# Patient Record
Sex: Female | Born: 1955 | Race: White | Hispanic: No | Marital: Married | Smoking: Never smoker
Health system: Southern US, Community
[De-identification: ages and names within clinical notes are randomized; demographics above are authoritative.]

## PROBLEM LIST (undated history)

## (undated) DIAGNOSIS — K589 Irritable bowel syndrome without diarrhea: Secondary | ICD-10-CM

## (undated) DIAGNOSIS — M419 Scoliosis, unspecified: Secondary | ICD-10-CM

## (undated) DIAGNOSIS — K219 Gastro-esophageal reflux disease without esophagitis: Secondary | ICD-10-CM

## (undated) DIAGNOSIS — M545 Low back pain, unspecified: Secondary | ICD-10-CM

## (undated) DIAGNOSIS — M48061 Spinal stenosis, lumbar region without neurogenic claudication: Secondary | ICD-10-CM

## (undated) DIAGNOSIS — H269 Unspecified cataract: Secondary | ICD-10-CM

## (undated) DIAGNOSIS — K5792 Diverticulitis of intestine, part unspecified, without perforation or abscess without bleeding: Secondary | ICD-10-CM

## (undated) DIAGNOSIS — M5416 Radiculopathy, lumbar region: Secondary | ICD-10-CM

## (undated) DIAGNOSIS — T7840XA Allergy, unspecified, initial encounter: Secondary | ICD-10-CM

## (undated) DIAGNOSIS — D6851 Activated protein C resistance: Secondary | ICD-10-CM

## (undated) DIAGNOSIS — D1803 Hemangioma of intra-abdominal structures: Secondary | ICD-10-CM

## (undated) DIAGNOSIS — K635 Polyp of colon: Secondary | ICD-10-CM

## (undated) DIAGNOSIS — N809 Endometriosis, unspecified: Secondary | ICD-10-CM

## (undated) DIAGNOSIS — E7800 Pure hypercholesterolemia, unspecified: Secondary | ICD-10-CM

## (undated) DIAGNOSIS — M1990 Unspecified osteoarthritis, unspecified site: Secondary | ICD-10-CM

## (undated) DIAGNOSIS — K7581 Nonalcoholic steatohepatitis (NASH): Secondary | ICD-10-CM

## (undated) HISTORY — DX: Diverticulitis of intestine, part unspecified, without perforation or abscess without bleeding: K57.92

## (undated) HISTORY — DX: Pure hypercholesterolemia, unspecified: E78.00

## (undated) HISTORY — DX: Low back pain, unspecified: M54.50

## (undated) HISTORY — DX: Endometriosis, unspecified: N80.9

## (undated) HISTORY — PX: LAPAROSCOPIC ENDOMETRIOSIS FULGURATION: SUR769

## (undated) HISTORY — DX: Spinal stenosis, lumbar region without neurogenic claudication: M48.061

## (undated) HISTORY — DX: Nonalcoholic steatohepatitis (NASH): K75.81

## (undated) HISTORY — DX: Unspecified cataract: H26.9

## (undated) HISTORY — PX: UPPER GASTROINTESTINAL ENDOSCOPY: SHX188

## (undated) HISTORY — DX: Polyp of colon: K63.5

## (undated) HISTORY — DX: Irritable bowel syndrome, unspecified: K58.9

## (undated) HISTORY — DX: Allergy, unspecified, initial encounter: T78.40XA

## (undated) HISTORY — DX: Gastro-esophageal reflux disease without esophagitis: K21.9

## (undated) HISTORY — DX: Scoliosis, unspecified: M41.9

## (undated) HISTORY — DX: Radiculopathy, lumbar region: M54.16

## (undated) HISTORY — DX: Unspecified osteoarthritis, unspecified site: M19.90

## (undated) HISTORY — DX: Activated protein C resistance: D68.51

## (undated) HISTORY — PX: EYE SURGERY: SHX253

## (undated) HISTORY — PX: CHOLECYSTECTOMY: SHX55

## (undated) HISTORY — DX: Hemangioma of intra-abdominal structures: D18.03

## (undated) HISTORY — PX: COLONOSCOPY: SHX174

---

## 1994-04-18 HISTORY — PX: HEMORRHOID SURGERY: SHX153

## 1996-04-18 HISTORY — PX: ABDOMINAL HYSTERECTOMY: SHX81

## 1998-04-18 HISTORY — PX: BACK SURGERY: SHX140

## 2012-04-18 HISTORY — PX: ESOPHAGOGASTRODUODENOSCOPY: SHX1529

## 2013-07-03 LAB — HM COLONOSCOPY

## 2016-10-18 LAB — HM MAMMOGRAPHY: HM MAMMO: NORMAL (ref 0–4)

## 2017-02-02 ENCOUNTER — Encounter: Payer: Self-pay | Admitting: Physician Assistant

## 2017-02-02 ENCOUNTER — Ambulatory Visit (INDEPENDENT_AMBULATORY_CARE_PROVIDER_SITE_OTHER): Payer: 59 | Admitting: Physician Assistant

## 2017-02-02 VITALS — BP 116/62 | HR 77 | Temp 97.6°F | Resp 18 | Ht 66.5 in | Wt 165.6 lb

## 2017-02-02 DIAGNOSIS — D6851 Activated protein C resistance: Secondary | ICD-10-CM

## 2017-02-02 DIAGNOSIS — Z79899 Other long term (current) drug therapy: Secondary | ICD-10-CM | POA: Diagnosis not present

## 2017-02-02 DIAGNOSIS — N809 Endometriosis, unspecified: Secondary | ICD-10-CM

## 2017-02-02 DIAGNOSIS — K7581 Nonalcoholic steatohepatitis (NASH): Secondary | ICD-10-CM

## 2017-02-02 DIAGNOSIS — D649 Anemia, unspecified: Secondary | ICD-10-CM

## 2017-02-02 DIAGNOSIS — N189 Chronic kidney disease, unspecified: Secondary | ICD-10-CM | POA: Diagnosis not present

## 2017-02-02 DIAGNOSIS — D1803 Hemangioma of intra-abdominal structures: Secondary | ICD-10-CM

## 2017-02-02 DIAGNOSIS — R6889 Other general symptoms and signs: Secondary | ICD-10-CM

## 2017-02-02 DIAGNOSIS — Z0001 Encounter for general adult medical examination with abnormal findings: Secondary | ICD-10-CM

## 2017-02-02 DIAGNOSIS — N750 Cyst of Bartholin's gland: Secondary | ICD-10-CM

## 2017-02-02 DIAGNOSIS — E7211 Homocystinuria: Secondary | ICD-10-CM | POA: Diagnosis not present

## 2017-02-02 DIAGNOSIS — K449 Diaphragmatic hernia without obstruction or gangrene: Secondary | ICD-10-CM

## 2017-02-02 DIAGNOSIS — K5909 Other constipation: Secondary | ICD-10-CM

## 2017-02-02 DIAGNOSIS — E559 Vitamin D deficiency, unspecified: Secondary | ICD-10-CM

## 2017-02-02 DIAGNOSIS — R911 Solitary pulmonary nodule: Secondary | ICD-10-CM

## 2017-02-02 DIAGNOSIS — E782 Mixed hyperlipidemia: Secondary | ICD-10-CM | POA: Diagnosis not present

## 2017-02-02 DIAGNOSIS — M2550 Pain in unspecified joint: Secondary | ICD-10-CM

## 2017-02-02 DIAGNOSIS — R7989 Other specified abnormal findings of blood chemistry: Secondary | ICD-10-CM

## 2017-02-02 DIAGNOSIS — R932 Abnormal findings on diagnostic imaging of liver and biliary tract: Secondary | ICD-10-CM | POA: Insufficient documentation

## 2017-02-02 HISTORY — DX: Endometriosis, unspecified: N80.9

## 2017-02-02 HISTORY — DX: Nonalcoholic steatohepatitis (NASH): K75.81

## 2017-02-02 HISTORY — DX: Activated protein C resistance: D68.51

## 2017-02-02 HISTORY — DX: Hemangioma of intra-abdominal structures: D18.03

## 2017-02-02 NOTE — Progress Notes (Signed)
Complete Physical  Assessment and Plan:  Polyarthralgia Patient had elevated ANA 3 years ago, but no further labs, will check labs due to edema/multiple joint complaints, if positive will send to rheum.  -     Sedimentation rate -     ANA -     Anti-DNA antibody, double-stranded -     Rheumatoid factor -     Cyclic citrul peptide antibody, IgG -     C3 and C4 -     HLA-B27 antigen -     Vitamin B1  Lung nodule Will continue to monitor/request notes  Factor V Leiden [facility]) Patient did not know this was in her history, will request further notes, likely carrier, mom and brother with DVTs, no personal history of DVT or miscarriages, will get on bASA  Mixed hyperlipidemia -     Lipid panel -continue medications, check lipids, decrease fatty foods, increase activity.    Elevated homocysteine (HCC) -     Homocysteine - decrease carbs, weight loss, get on vitamin B's  Vitamin D deficiency -     VITAMIN D 25 Hydroxy (Vit-D Deficiency, Fractures)  Endometriosis No symptoms at this time  Liver hemangioma Monitor  Hiatal hernia Get on zantac/pepcid  Chronic kidney disease, unspecified CKD stage -     BASIC METABOLIC PANEL WITH GFR -     Hemoglobin A1c -     Urinalysis, Routine w reflex microscopic -     Microalbumin / creatinine urine ratio -     Uric acid - Increase fluids, avoid NSAIDS, monitor sugars, will monitor  NASH (nonalcoholic steatohepatitis) -     Hepatic function panel - Check labs, avoid tylenol, alcohol, weight loss advised.   Other constipation -     TSH - increase water, fiber  Bartholin cyst -     Ambulatory referral to Gynecology - on CT scan, has discomfort, will refer to GYN or evaluation  Medication management -     CBC with Differential/Platelet -     BASIC METABOLIC PANEL WITH GFR -     Hepatic function panel -     Magnesium  Anemia, unspecified type -     Iron,Total/Total Iron Binding Cap -     Ferritin -     Vitamin  B12   Discussed med's effects and SE's. Screening labs and tests as requested with regular follow-up as recommended. Over 40 minutes of exam, counseling, chart review, and complex, high level critical decision making was performed this visit.   HPI  61 y.o. female  presents for a complete physical and follow up for has Lung nodule; Factor V Leiden ([place]); Mixed hyperlipidemia; Elevated homocysteine ([place]); Vitamin D deficiency; Endometriosis; Liver hemangioma; Hiatal hernia; CKD (chronic kidney disease); and NASH (nonalcoholic steatohepatitis) on her problem list..  Moved here from [place], lived in [place] for a while, she is married, she is an Optometrist.  She has been following with GI, Dr. Odis Hollingshead, [phone] Fax [phone]  She recounts a time in [place] that she had horrible inflammation/edema, had AB edema, pain in her hands, had + ANA. She has had recent LUQ pain, had CT AB that showed diverticuli, hemangioma of the liver, and right sided bartholin cyst. She has right lower AB pain and would like to see GYN.  She has been having swelling hands, legs, feet, having severe pain in these areas, started whole 30 that is helping.   Has had rash on her back x years.   Her blood pressure has  been controlled at home, today their BP is BP: 116/62 She does not workout due to pain. She denies chest pain, shortness of breath, dizziness.   She has history of NASH, CKD3 and elevated ANA and homocysteine.   Current Medications:  No current outpatient prescriptions on file prior to visit.   No current facility-administered medications on file prior to visit.    Allergies:  Allergies  Allergen Reactions  . Morphine And Related Swelling   Medical History:  She has Lung nodule; Factor V Leiden ([place]); Mixed hyperlipidemia; Elevated homocysteine ([place]); Vitamin D deficiency; Endometriosis; Liver hemangioma; Hiatal hernia; CKD (chronic kidney disease); and NASH (nonalcoholic steatohepatitis) on her  problem list.   Health Maintenance:   Tetanus: 12/2015 Pneumovax: Prevnar 13:  Flu vaccine: Zostavax:  LMP: TAH Pap: TAH MGM:10/17/2016 DEXA: N/A Colonoscopy: 2015 per patient every 5 years EGD: N/A  Patient Care Team: Unk Pinto, MD as PCP - General (Internal Medicine)  Surgical History:  She has a past surgical history that includes Cholecystectomy; Abdominal hysterectomy (1998); Laparoscopic endometriosis fulguration; Hemorrhoid surgery (1996); and Back surgery (2000). Family History:  Herfamily history includes Arthritis in her mother; Atrial fibrillation in her brother; Cancer (age of onset: 82) in her maternal grandmother; Cirrhosis in her mother; Depression in her mother and sister; Diabetes in her brother, father, and mother; Gout in her mother; Heart disease in her father; Hyperlipidemia in her son; Hypertension in her brother; Valvular heart disease in her brother and sister. Social History:  She reports that she has never smoked. She has never used smokeless tobacco.  Review of Systems: Review of Systems  Constitutional: Positive for malaise/fatigue. Negative for chills, diaphoresis, fever and weight loss.  HENT: Negative.   Eyes: Negative.   Respiratory: Negative.   Cardiovascular: Positive for leg swelling. Negative for chest pain, palpitations, orthopnea, claudication and PND.  Gastrointestinal: Positive for abdominal pain, constipation and heartburn. Negative for blood in stool, diarrhea, melena, nausea and vomiting.  Genitourinary: Negative.   Musculoskeletal: Positive for myalgias. Negative for back pain, falls, joint pain and neck pain.  Skin: Positive for rash. Negative for itching.  Neurological: Negative.  Negative for weakness.  Endo/Heme/Allergies: Negative.   Psychiatric/Behavioral: Negative for hallucinations, memory loss, substance abuse and suicidal ideas. The patient has insomnia. The patient is not nervous/anxious.     Physical  Exam: Estimated body mass index is 26.33 kg/m as calculated from the following:   Height as of this encounter: 5' 6.5" (1.689 m).   Weight as of this encounter: 165 lb 9.6 oz (75.1 kg). BP 116/62   Pulse 77   Temp 97.6 F (36.4 C)   Resp 18   Ht 5' 6.5" (1.689 m)   Wt 165 lb 9.6 oz (75.1 kg)   SpO2 95%   BMI 26.33 kg/m  General Appearance: Well nourished, in no apparent distress.  Eyes: PERRLA, EOMs, conjunctiva no swelling or erythema, normal fundi and vessels.  Sinuses: No Frontal/maxillary tenderness  ENT/Mouth: Ext aud canals clear, normal light reflex with TMs without erythema, bulging. Good dentition. No erythema, swelling, or exudate on post pharynx. Tonsils not swollen or erythematous. Hearing normal.  Neck: Supple, thyroid normal. No bruits  Respiratory: Respiratory effort normal, BS equal bilaterally without rales, rhonchi, wheezing or stridor.  Cardio: RRR without murmurs, rubs or gallops. Brisk peripheral pulses without edema.  Chest: symmetric, with normal excursions and percussion.  Breasts: defer Abdomen: Soft, left upper quadrant tenderness, no guarding, rebound, hernias, masses, or organomegaly.  Lymphatics: Non tender  without lymphadenopathy.  Genitourinary: defer Musculoskeletal: Full ROM all peripheral extremities,5/5 strength, and normal gait.  Skin: Warm, dry without rashes, lesions, ecchymosis. Neuro: Cranial nerves intact, reflexes equal bilaterally. Normal muscle tone, no cerebellar symptoms. Sensation intact.  Psych: Awake and oriented X 3, normal affect, Insight and Judgment appropriate.   EKG: defer AORTA SCAN: defer  Vicie Mutters 8:01 AM [facility]

## 2017-02-02 NOTE — Patient Instructions (Addendum)
Add ENTERIC COATED low dose 81 mg Aspirin daily OR can do every other day if you have easy bruising to protect your heart and head. As well as to reduce risk of Colon Cancer by 20 %, Skin Cancer by 26 % , Melanoma by 46% and Pancreatic cancer by 60%  Get on zantac or pepcid x 2 weeks 1 pill at night then stop  Add on apple cider vinegar Can do kabucha too Look up yeast overgrowth syndrome  Look up LPR or silent reflux  Check about the sleep apnea  I think it is possible that you have sleep apnea. It can cause interrupted sleep, headaches, frequent awakenings, fatigue, dry mouth, fast/slow heart beats, memory issues, anxiety/depression, swelling, numbness tingling hands/feet, weight gain, shortness of breath, and the list goes on. Sleep apnea needs to be ruled out because if it is left untreated it does eventually lead to abnormal heart beats, lung failure or heart failure as well as increasing the risk of heart attack and stroke. There are masks you can wear OR a mouth piece that I can give you information about. Often times though people feel MUCH better after getting treatment.   Sleep Apnea  Sleep apnea is a sleep disorder characterized by abnormal pauses in breathing while you sleep. When your breathing pauses, the level of oxygen in your blood decreases. This causes you to move out of deep sleep and into light sleep. As a result, your quality of sleep is poor, and the system that carries your blood throughout your body (cardiovascular system) experiences stress. If sleep apnea remains untreated, the following conditions can develop:  High blood pressure (hypertension).  Coronary artery disease.  Inability to achieve or maintain an erection (impotence).  Impairment of your thought process (cognitive dysfunction). There are three types of sleep apnea: 1. Obstructive sleep apnea--Pauses in breathing during sleep because of a blocked airway. 2. Central sleep apnea--Pauses in breathing  during sleep because the area of the brain that controls your breathing does not send the correct signals to the muscles that control breathing. 3. Mixed sleep apnea--A combination of both obstructive and central sleep apnea.  RISK FACTORS The following risk factors can increase your risk of developing sleep apnea:  Being overweight.  Smoking.  Having narrow passages in your nose and throat.  Being of older age.  Being female.  Alcohol use.  Sedative and tranquilizer use.  Ethnicity. Among individuals younger than 35 years, African Americans are at increased risk of sleep apnea. SYMPTOMS   Difficulty staying asleep.  Daytime sleepiness and fatigue.  Loss of energy.  Irritability.  Loud, heavy snoring.  Morning headaches.  Trouble concentrating.  Forgetfulness.  Decreased interest in sex. DIAGNOSIS  In order to diagnose sleep apnea, your caregiver will perform a physical examination. Your caregiver may suggest that you take a home sleep test. Your caregiver may also recommend that you spend the night in a sleep lab. In the sleep lab, several monitors record information about your heart, lungs, and brain while you sleep. Your leg and arm movements and blood oxygen level are also recorded. TREATMENT The following actions may help to resolve mild sleep apnea:  Sleeping on your side.   Using a decongestant if you have nasal congestion.   Avoiding the use of depressants, including alcohol, sedatives, and narcotics.   Losing weight and modifying your diet if you are overweight. There also are devices and treatments to help open your airway:  Oral appliances. These are custom-made  mouthpieces that shift your lower jaw forward and slightly open your bite. This opens your airway.  Devices that create positive airway pressure. This positive pressure "splints" your airway open to help you breathe better during sleep. The following devices create positive airway  pressure:  Continuous positive airway pressure (CPAP) device. The CPAP device creates a continuous level of air pressure with an air pump. The air is delivered to your airway through a mask while you sleep. This continuous pressure keeps your airway open.  Nasal expiratory positive airway pressure (EPAP) device. The EPAP device creates positive air pressure as you exhale. The device consists of single-use valves, which are inserted into each nostril and held in place by adhesive. The valves create very little resistance when you inhale but create much more resistance when you exhale. That increased resistance creates the positive airway pressure. This positive pressure while you exhale keeps your airway open, making it easier to breath when you inhale again.  Bilevel positive airway pressure (BPAP) device. The BPAP device is used mainly in patients with central sleep apnea. This device is similar to the CPAP device because it also uses an air pump to deliver continuous air pressure through a mask. However, with the BPAP machine, the pressure is set at two different levels. The pressure when you exhale is lower than the pressure when you inhale.  Surgery. Typically, surgery is only done if you cannot comply with less invasive treatments or if the less invasive treatments do not improve your condition. Surgery involves removing excess tissue in your airway to create a wider passage way. Document Released: 03/25/2002 Document Revised: 07/30/2012 Document Reviewed: 08/11/2011 [facility] Patient Information 2015 [place], [place]. This information is not intended to replace advice given to you by your health care provider. Make sure you discuss any questions you have with your health care provider.

## 2017-02-05 ENCOUNTER — Encounter: Payer: Self-pay | Admitting: Physician Assistant

## 2017-02-05 DIAGNOSIS — R768 Other specified abnormal immunological findings in serum: Secondary | ICD-10-CM

## 2017-02-08 LAB — CBC WITH DIFFERENTIAL/PLATELET
BASOS ABS: 61 {cells}/uL (ref 0–200)
Basophils Relative: 1 %
EOS ABS: 250 {cells}/uL (ref 15–500)
Eosinophils Relative: 4.1 %
HCT: 38.8 % (ref 35.0–45.0)
HEMOGLOBIN: 13.3 g/dL (ref 11.7–15.5)
Lymphs Abs: 1909 cells/uL (ref 850–3900)
MCH: 30.9 pg (ref 27.0–33.0)
MCHC: 34.3 g/dL (ref 32.0–36.0)
MCV: 90.2 fL (ref 80.0–100.0)
MONOS PCT: 8 %
MPV: 11.1 fL (ref 7.5–12.5)
NEUTROS ABS: 3392 {cells}/uL (ref 1500–7800)
Neutrophils Relative %: 55.6 %
Platelets: 254 10*3/uL (ref 140–400)
RBC: 4.3 10*6/uL (ref 3.80–5.10)
RDW: 11.5 % (ref 11.0–15.0)
Total Lymphocyte: 31.3 %
WBC: 6.1 10*3/uL (ref 3.8–10.8)
WBCMIX: 488 {cells}/uL (ref 200–950)

## 2017-02-08 LAB — VITAMIN D 25 HYDROXY (VIT D DEFICIENCY, FRACTURES): Vit D, 25-Hydroxy: 42 ng/mL (ref 30–100)

## 2017-02-08 LAB — LIPID PANEL
CHOL/HDL RATIO: 3.5 (calc) (ref <5.0)
CHOLESTEROL: 229 mg/dL — AB (ref <200)
HDL: 65 mg/dL (ref >50)
LDL Cholesterol (Calc): 138 mg/dL (calc) — ABNORMAL HIGH
Non-HDL Cholesterol (Calc): 164 mg/dL (calc) — ABNORMAL HIGH (ref <130)
Triglycerides: 132 mg/dL (ref <150)

## 2017-02-08 LAB — URINALYSIS, ROUTINE W REFLEX MICROSCOPIC
Bilirubin Urine: NEGATIVE
GLUCOSE, UA: NEGATIVE
Hgb urine dipstick: NEGATIVE
Ketones, ur: NEGATIVE
LEUKOCYTES UA: NEGATIVE
Nitrite: NEGATIVE
PH: 7.5 (ref 5.0–8.0)
PROTEIN: NEGATIVE
Specific Gravity, Urine: 1.019 (ref 1.001–1.03)

## 2017-02-08 LAB — HEPATIC FUNCTION PANEL
AG RATIO: 2 (calc) (ref 1.0–2.5)
ALKALINE PHOSPHATASE (APISO): 50 U/L (ref 33–130)
ALT: 18 U/L (ref 6–29)
AST: 18 U/L (ref 10–35)
Albumin: 4.5 g/dL (ref 3.6–5.1)
BILIRUBIN INDIRECT: 0.5 mg/dL (ref 0.2–1.2)
Bilirubin, Direct: 0.1 mg/dL (ref 0.0–0.2)
Globulin: 2.3 g/dL (calc) (ref 1.9–3.7)
TOTAL PROTEIN: 6.8 g/dL (ref 6.1–8.1)
Total Bilirubin: 0.6 mg/dL (ref 0.2–1.2)

## 2017-02-08 LAB — BASIC METABOLIC PANEL WITH GFR
BUN / CREAT RATIO: 25 (calc) — AB (ref 6–22)
BUN: 26 mg/dL — ABNORMAL HIGH (ref 7–25)
CO2: 25 mmol/L (ref 20–32)
CREATININE: 1.05 mg/dL (ref 0.50–1.05)
Calcium: 9.4 mg/dL (ref 8.6–10.4)
Chloride: 105 mmol/L (ref 98–110)
GFR, EST AFRICAN AMERICAN: 67 mL/min/{1.73_m2} (ref >=60)
GFR, Est Non African American: 58 mL/min/{1.73_m2} — ABNORMAL LOW (ref >=60)
GLUCOSE: 86 mg/dL (ref 65–99)
Potassium: 4 mmol/L (ref 3.5–5.3)
SODIUM: 140 mmol/L (ref 135–146)

## 2017-02-08 LAB — URIC ACID: Uric Acid, Serum: 5.3 mg/dL (ref 2.5–7.0)

## 2017-02-08 LAB — ANTI-DNA ANTIBODY, DOUBLE-STRANDED: ds DNA Ab: 1 IU/mL

## 2017-02-08 LAB — MICROALBUMIN / CREATININE URINE RATIO
Creatinine, Urine: 109 mg/dL (ref 20–275)
MICROALB/CREAT RATIO: 2 ug/mg{creat} (ref <30)
Microalb, Ur: 0.2 mg/dL

## 2017-02-08 LAB — HEMOGLOBIN A1C
Hgb A1c MFr Bld: 5.5 % of total Hgb (ref <5.7)
Mean Plasma Glucose: 111 (calc)
eAG (mmol/L): 6.2 (calc)

## 2017-02-08 LAB — VITAMIN B1: VITAMIN B1 (THIAMINE): 9 nmol/L (ref 8–30)

## 2017-02-08 LAB — FERRITIN: FERRITIN: 133 ng/mL (ref 10–232)

## 2017-02-08 LAB — CYCLIC CITRUL PEPTIDE ANTIBODY, IGG: Cyclic Citrullin Peptide Ab: <16 UNITS

## 2017-02-08 LAB — IRON, TOTAL/TOTAL IRON BINDING CAP
%SAT: 18 % (calc) (ref 11–50)
IRON: 60 ug/dL (ref 45–160)
TIBC: 334 ug/dL (ref 250–450)

## 2017-02-08 LAB — ANA: ANA: POSITIVE — AB

## 2017-02-08 LAB — RHEUMATOID FACTOR: Rhuematoid fact SerPl-aCnc: <14 IU/mL (ref <14)

## 2017-02-08 LAB — HLA-B27 ANTIGEN: HLA-B27 ANTIGEN: NEGATIVE

## 2017-02-08 LAB — VITAMIN B12: Vitamin B-12: >2000 pg/mL — ABNORMAL HIGH (ref 200–1100)

## 2017-02-08 LAB — C3 AND C4
C3 Complement: 135 mg/dL (ref 83–193)
C4 Complement: 24 mg/dL (ref 15–57)

## 2017-02-08 LAB — ANTI-NUCLEAR AB-TITER (ANA TITER)

## 2017-02-08 LAB — TSH: TSH: 2.27 m[IU]/L (ref 0.40–4.50)

## 2017-02-08 LAB — HOMOCYSTEINE: HOMOCYSTEINE: 11.1 umol/L — AB (ref <10.4)

## 2017-02-08 LAB — SEDIMENTATION RATE: SED RATE: 9 mm/h (ref 0–30)

## 2017-02-08 LAB — MAGNESIUM: Magnesium: 2.2 mg/dL (ref 1.5–2.5)

## 2017-02-16 DIAGNOSIS — M79641 Pain in right hand: Secondary | ICD-10-CM | POA: Diagnosis not present

## 2017-02-16 DIAGNOSIS — M79671 Pain in right foot: Secondary | ICD-10-CM | POA: Diagnosis not present

## 2017-02-16 DIAGNOSIS — M25561 Pain in right knee: Secondary | ICD-10-CM | POA: Diagnosis not present

## 2017-02-16 DIAGNOSIS — M25572 Pain in left ankle and joints of left foot: Secondary | ICD-10-CM | POA: Diagnosis not present

## 2017-02-16 DIAGNOSIS — M2550 Pain in unspecified joint: Secondary | ICD-10-CM | POA: Diagnosis not present

## 2017-02-22 ENCOUNTER — Ambulatory Visit: Payer: Self-pay | Admitting: Gynecology

## 2017-03-16 ENCOUNTER — Encounter: Payer: Self-pay | Admitting: Internal Medicine

## 2017-03-16 ENCOUNTER — Ambulatory Visit: Payer: Self-pay | Admitting: Gynecology

## 2017-03-20 ENCOUNTER — Ambulatory Visit: Payer: 59 | Admitting: Obstetrics & Gynecology

## 2017-03-20 ENCOUNTER — Encounter: Payer: Self-pay | Admitting: Obstetrics & Gynecology

## 2017-03-20 VITALS — BP 136/84 | Ht 66.5 in | Wt 170.0 lb

## 2017-03-20 DIAGNOSIS — Z13820 Encounter for screening for osteoporosis: Secondary | ICD-10-CM

## 2017-03-20 DIAGNOSIS — N750 Cyst of Bartholin's gland: Secondary | ICD-10-CM

## 2017-03-20 DIAGNOSIS — E8940 Asymptomatic postprocedural ovarian failure: Secondary | ICD-10-CM

## 2017-03-20 DIAGNOSIS — Z01411 Encounter for gynecological examination (general) (routine) with abnormal findings: Secondary | ICD-10-CM | POA: Diagnosis not present

## 2017-03-20 DIAGNOSIS — Z1151 Encounter for screening for human papillomavirus (HPV): Secondary | ICD-10-CM

## 2017-03-20 NOTE — Progress Notes (Signed)
Samantha Mckinney [birth year] [id number]   History:    61 y.o. G1P1L1 Married.  Son is 110 yo doing well.  RP:  New patient presenting  for annual gyn exam   HPI:  S/P Total Hysterectomy/BSO at 61 yo.  On Estrogen HRT until 2012.  Investigating Arthritis/R/O Lupus currently.  No pelvic pain.  Breasts normal.  Urine and bowel movements normal.  Health labs with family physician.  Past medical history,surgical history, family history and social history were all reviewed and documented in the EPIC chart.  Gynecologic History No LMP recorded. Patient has had a hysterectomy. Contraception: status post hysterectomy Last Pap: 2017. Results were: normal Last mammogram: 2018. Results were: normal  Obstetric History OB History  Gravida Para Term Preterm AB Living  1 1       1   SAB TAB Ectopic Multiple Live Births               # Outcome Date GA Lbr Len/2nd Weight Sex Delivery Anes PTL Lv  1 Para                ROS: A ROS was performed and pertinent positives and negatives are included in the history.  GENERAL: No fevers or chills. HEENT: No change in vision, no earache, sore throat or sinus congestion. NECK: No pain or stiffness. CARDIOVASCULAR: No chest pain or pressure. No palpitations. PULMONARY: No shortness of breath, cough or wheeze. GASTROINTESTINAL: No abdominal pain, nausea, vomiting or diarrhea, melena or bright red blood per rectum. GENITOURINARY: No urinary frequency, urgency, hesitancy or dysuria. MUSCULOSKELETAL: No joint or muscle pain, no back pain, no recent trauma. DERMATOLOGIC: No rash, no itching, no lesions. ENDOCRINE: No polyuria, polydipsia, no heat or cold intolerance. No recent change in weight. HEMATOLOGICAL: No anemia or easy bruising or bleeding. NEUROLOGIC: No headache, seizures, numbness, tingling or weakness. PSYCHIATRIC: No depression, no loss of interest in normal activity or change in sleep pattern.     Exam:   BP 136/84   Ht 5' 6.5" (1.689 m)   Wt 170 lb  (77.1 kg)   BMI 27.03 kg/m   Body mass index is 27.03 kg/m.  General appearance : Well developed well nourished female. No acute distress HEENT: Eyes: no retinal hemorrhage or exudates,  Neck supple, trachea midline, no carotid bruits, no thyroidmegaly Lungs: Clear to auscultation, no rhonchi or wheezes, or rib retractions  Heart: Regular rate and rhythm, no murmurs or gallops Breast:Examined in sitting and supine position were symmetrical in appearance, no palpable masses or tenderness,  no skin retraction, no nipple inversion, no nipple discharge, no skin discoloration, no axillary or supraclavicular lymphadenopathy Abdomen: no palpable masses or tenderness, no rebound or guarding Extremities: no edema or skin discoloration or tenderness  Pelvic: Vulva Left normal.  Right with small Bartholin gland cyst about 2 cm.  No erythema, nontender.  No sign of infection.  Urethra, Skene Glands: Within normal limits             Vagina: No gross lesions or discharge  Cervix/Uterus absent  Adnexa  Without masses or tenderness  Anus and perineum  normal    Assessment/Plan:  61 y.o. female for annual exam   1. Encounter for gynecological examination with abnormal finding Gynecologic exam with small right Bartholin gland cyst.  Pap normal in 2017.  Will repeat Pap next year.  Breast exam normal.  Mammogram in 2018 normal.  Colonoscopy 5 years ago.  Will do health labs with family physician.  2. Cyst of right Bartholin's gland Small, asymptomatic, no sign of infection.  Recommend observation.  Reassured.  Counseling done in case it becomes infected.  3. 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    4. Screening for osteoporosis Vitamin D supplements, calcium in nutrition and weight bearing physical activity recommended.  Will follow up here for bone density.  Counseling on above issues more than 15% for - DG Bone Density; Future  Counseling on above issues more  than 50% for 10 minutes.  Princess Bruins MD, 11:54 AM 03/20/2017

## 2017-03-22 LAB — PAP, TP IMAGING W/ HPV RNA, RFLX HPV TYPE 16,18/45: HPV DNA HIGH RISK: NOT DETECTED

## 2017-03-26 ENCOUNTER — Encounter: Payer: Self-pay | Admitting: Obstetrics & Gynecology

## 2017-03-26 NOTE — Patient Instructions (Signed)
1. Encounter for gynecological examination with abnormal finding Gynecologic exam with small right Bartholin gland cyst.  Pap normal in 2017.  Will repeat Pap next year.  Breast exam normal.  Mammogram in 2018 normal.  Colonoscopy 5 years ago.  Will do health labs with family physician.  2. Cyst of right Bartholin's gland Small, asymptomatic, no sign of infection.  Recommend observation.  Reassured.  Counseling done in case it becomes infected.  3. 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    4. Screening for osteoporosis Vitamin D supplements, calcium in nutrition and weight bearing physical activity recommended.  Will follow up here for bone density.  Counseling on above issues more than 15% for - DG Bone Density; Future  Samantha Mckinney, it was a pleasure meeting you today!  I will inform you of your bone density results when available.   Health Maintenance for Postmenopausal Women Menopause is a normal process in which your reproductive ability comes to an end. This process happens gradually over a span of months to years, usually between the ages of 9 and 76. Menopause is complete when you have missed 12 consecutive menstrual periods. It is important to talk with your health care provider about some of the most common conditions that affect postmenopausal women, such as heart disease, cancer, and bone loss (osteoporosis). Adopting a healthy lifestyle and getting preventive care can help to promote your health and wellness. Those actions can also lower your chances of developing some of these common conditions. What should I know about menopause? During menopause, you may experience a number of symptoms, such as:  Moderate-to-severe hot flashes.  Night sweats.  Decrease in sex drive.  Mood swings.  Headaches.  Tiredness.  Irritability.  Memory problems.  Insomnia.  Choosing to treat or not to treat menopausal changes is an individual decision that you  make with your health care provider. What should I know about hormone replacement therapy and supplements? Hormone therapy products are effective for treating symptoms that are associated with menopause, such as hot flashes and night sweats. Hormone replacement carries certain risks, especially as you become older. If you are thinking about using estrogen or estrogen with progestin treatments, discuss the benefits and risks with your health care provider. What should I know about heart disease and stroke? Heart disease, heart attack, and stroke become more likely as you age. This may be due, in part, to the hormonal changes that your body experiences during menopause. These can affect how your body processes dietary fats, triglycerides, and cholesterol. Heart attack and stroke are both medical emergencies. There are many things that you can do to help prevent heart disease and stroke:  Have your blood pressure checked at least every 1-2 years. High blood pressure causes heart disease and increases the risk of stroke.  If you are 67-17 years old, ask your health care provider if you should take aspirin to prevent a heart attack or a stroke.  Do not use any tobacco products, including cigarettes, chewing tobacco, or electronic cigarettes. If you need help quitting, ask your health care provider.  It is important to eat a healthy diet and maintain a healthy weight. ? Be sure to include plenty of vegetables, fruits, low-fat dairy products, and lean protein. ? Avoid eating foods that are high in solid fats, added sugars, or salt (sodium).  Get regular exercise. This is one of the most important things that you can do for your health. ? Try to exercise for at  least 150 minutes each week. The type of exercise that you do should increase your heart rate and make you sweat. This is known as moderate-intensity exercise. ? Try to do strengthening exercises at least twice each week. Do these in addition to  the moderate-intensity exercise.  Know your numbers.Ask your health care provider to check your cholesterol and your blood glucose. Continue to have your blood tested as directed by your health care provider.  What should I know about cancer screening? There are several types of cancer. Take the following steps to reduce your risk and to catch any cancer development as early as possible. Breast Cancer  Practice breast self-awareness. ? This means understanding how your breasts normally appear and feel. ? It also means doing regular breast self-exams. Let your health care provider know about any changes, no matter how small.  If you are 14 or older, have a clinician do a breast exam (clinical breast exam or CBE) every year. Depending on your age, family history, and medical history, it may be recommended that you also have a yearly breast X-ray (mammogram).  If you have a family history of breast cancer, talk with your health care provider about genetic screening.  If you are at high risk for breast cancer, talk with your health care provider about having an MRI and a mammogram every year.  Breast cancer (BRCA) gene test is recommended for women who have family members with BRCA-related cancers. Results of the assessment will determine the need for genetic counseling and BRCA1 and for BRCA2 testing. BRCA-related cancers include these types: ? Breast. This occurs in males or females. ? Ovarian. ? Tubal. This may also be called fallopian tube cancer. ? Cancer of the abdominal or pelvic lining (peritoneal cancer). ? Prostate. ? Pancreatic.  Cervical, Uterine, and Ovarian Cancer Your health care provider may recommend that you be screened regularly for cancer of the pelvic organs. These include your ovaries, uterus, and vagina. This screening involves a pelvic exam, which includes checking for microscopic changes to the surface of your cervix (Pap test).  For women ages 21-65, health care  providers may recommend a pelvic exam and a Pap test every three years. For women ages 72-65, they may recommend the Pap test and pelvic exam, combined with testing for human papilloma virus (HPV), every five years. Some types of HPV increase your risk of cervical cancer. Testing for HPV may also be done on women of any age who have unclear Pap test results.  Other health care providers may not recommend any screening for nonpregnant women who are considered low risk for pelvic cancer and have no symptoms. Ask your health care provider if a screening pelvic exam is right for you.  If you have had past treatment for cervical cancer or a condition that could lead to cancer, you need Pap tests and screening for cancer for at least 20 years after your treatment. If Pap tests have been discontinued for you, your risk factors (such as having a new sexual partner) need to be reassessed to determine if you should start having screenings again. Some women have medical problems that increase the chance of getting cervical cancer. In these cases, your health care provider may recommend that you have screening and Pap tests more often.  If you have a family history of uterine cancer or ovarian cancer, talk with your health care provider about genetic screening.  If you have vaginal bleeding after reaching menopause, tell your health care provider.  There are currently no reliable tests available to screen for ovarian cancer.  Lung Cancer Lung cancer screening is recommended for adults 30-76 years old who are at high risk for lung cancer because of a history of smoking. A yearly low-dose CT scan of the lungs is recommended if you:  Currently smoke.  Have a history of at least 30 pack-years of smoking and you currently smoke or have quit within the past 15 years. A pack-year is smoking an average of one pack of cigarettes per day for one year.  Yearly screening should:  Continue until it has been 15 years  since you quit.  Stop if you develop a health problem that would prevent you from having lung cancer treatment.  Colorectal Cancer  This type of cancer can be detected and can often be prevented.  Routine colorectal cancer screening usually begins at age 45 and continues through age 36.  If you have risk factors for colon cancer, your health care provider may recommend that you be screened at an earlier age.  If you have a family history of colorectal cancer, talk with your health care provider about genetic screening.  Your health care provider may also recommend using home test kits to check for hidden blood in your stool.  A small camera at the end of a tube can be used to examine your colon directly (sigmoidoscopy or colonoscopy). This is done to check for the earliest forms of colorectal cancer.  Direct examination of the colon should be repeated every 5-10 years until age 26. However, if early forms of precancerous polyps or small growths are found or if you have a family history or genetic risk for colorectal cancer, you may need to be screened more often.  Skin Cancer  Check your skin from head to toe regularly.  Monitor any moles. Be sure to tell your health care provider: ? About any new moles or changes in moles, especially if there is a change in a mole's shape or color. ? If you have a mole that is larger than the size of a pencil eraser.  If any of your family members has a history of skin cancer, especially at a young age, talk with your health care provider about genetic screening.  Always use sunscreen. Apply sunscreen liberally and repeatedly throughout the day.  Whenever you are outside, protect yourself by wearing long sleeves, pants, a wide-brimmed hat, and sunglasses.  What should I know about osteoporosis? Osteoporosis is a condition in which bone destruction happens more quickly than new bone creation. After menopause, you may be at an increased risk for  osteoporosis. To help prevent osteoporosis or the bone fractures that can happen because of osteoporosis, the following is recommended:  If you are 4-15 years old, get at least 1,000 mg of calcium and at least 600 mg of vitamin D per day.  If you are older than age 48 but younger than age 64, get at least 1,200 mg of calcium and at least 600 mg of vitamin D per day.  If you are older than age 39, get at least 1,200 mg of calcium and at least 800 mg of vitamin D per day.  Smoking and excessive alcohol intake increase the risk of osteoporosis. Eat foods that are rich in calcium and vitamin D, and do weight-bearing exercises several times each week as directed by your health care provider. What should I know about how menopause affects my mental health? Depression may occur at any  age, but it is more common as you become older. Common symptoms of depression include:  Low or sad mood.  Changes in sleep patterns.  Changes in appetite or eating patterns.  Feeling an overall lack of motivation or enjoyment of activities that you previously enjoyed.  Frequent crying spells.  Talk with your health care provider if you think that you are experiencing depression. What should I know about immunizations? It is important that you get and maintain your immunizations. These include:  Tetanus, diphtheria, and pertussis (Tdap) booster vaccine.  Influenza every year before the flu season begins.  Pneumonia vaccine.  Shingles vaccine.  Your health care provider may also recommend other immunizations. This information is not intended to replace advice given to you by your health care provider. Make sure you discuss any questions you have with your health care provider. Document Released: 05/27/2005 Document Revised: 10/23/2015 Document Reviewed: 01/06/2015 Elsevier Interactive Patient Education  2018 [employer].

## 2017-04-06 DIAGNOSIS — M5127 Other intervertebral disc displacement, lumbosacral region: Secondary | ICD-10-CM | POA: Diagnosis not present

## 2017-04-06 DIAGNOSIS — M5137 Other intervertebral disc degeneration, lumbosacral region: Secondary | ICD-10-CM | POA: Diagnosis not present

## 2017-04-06 DIAGNOSIS — M791 Myalgia, unspecified site: Secondary | ICD-10-CM | POA: Diagnosis not present

## 2017-04-06 DIAGNOSIS — R202 Paresthesia of skin: Secondary | ICD-10-CM | POA: Diagnosis not present

## 2017-04-06 DIAGNOSIS — M545 Low back pain: Secondary | ICD-10-CM | POA: Diagnosis not present

## 2017-04-13 ENCOUNTER — Encounter: Payer: Self-pay | Admitting: Internal Medicine

## 2017-04-21 DIAGNOSIS — M5137 Other intervertebral disc degeneration, lumbosacral region: Secondary | ICD-10-CM | POA: Diagnosis not present

## 2017-04-21 DIAGNOSIS — M545 Low back pain: Secondary | ICD-10-CM | POA: Diagnosis not present

## 2017-04-21 DIAGNOSIS — M791 Myalgia, unspecified site: Secondary | ICD-10-CM | POA: Diagnosis not present

## 2017-04-21 DIAGNOSIS — M5127 Other intervertebral disc displacement, lumbosacral region: Secondary | ICD-10-CM | POA: Diagnosis not present

## 2017-04-21 DIAGNOSIS — R202 Paresthesia of skin: Secondary | ICD-10-CM | POA: Diagnosis not present

## 2017-04-24 DIAGNOSIS — M545 Low back pain: Secondary | ICD-10-CM | POA: Diagnosis not present

## 2017-04-24 DIAGNOSIS — M791 Myalgia, unspecified site: Secondary | ICD-10-CM | POA: Diagnosis not present

## 2017-04-24 DIAGNOSIS — R202 Paresthesia of skin: Secondary | ICD-10-CM | POA: Diagnosis not present

## 2017-05-01 DIAGNOSIS — M791 Myalgia, unspecified site: Secondary | ICD-10-CM | POA: Diagnosis not present

## 2017-05-01 DIAGNOSIS — M5137 Other intervertebral disc degeneration, lumbosacral region: Secondary | ICD-10-CM | POA: Diagnosis not present

## 2017-05-01 DIAGNOSIS — M5127 Other intervertebral disc displacement, lumbosacral region: Secondary | ICD-10-CM | POA: Diagnosis not present

## 2017-05-01 DIAGNOSIS — R202 Paresthesia of skin: Secondary | ICD-10-CM | POA: Diagnosis not present

## 2017-05-01 DIAGNOSIS — M545 Low back pain: Secondary | ICD-10-CM | POA: Diagnosis not present

## 2017-05-08 DIAGNOSIS — M545 Low back pain: Secondary | ICD-10-CM | POA: Diagnosis not present

## 2017-05-08 DIAGNOSIS — R202 Paresthesia of skin: Secondary | ICD-10-CM | POA: Diagnosis not present

## 2017-05-08 DIAGNOSIS — M791 Myalgia, unspecified site: Secondary | ICD-10-CM | POA: Diagnosis not present

## 2017-05-15 DIAGNOSIS — R202 Paresthesia of skin: Secondary | ICD-10-CM | POA: Diagnosis not present

## 2017-05-15 DIAGNOSIS — M4726 Other spondylosis with radiculopathy, lumbar region: Secondary | ICD-10-CM | POA: Diagnosis not present

## 2017-05-22 DIAGNOSIS — M5127 Other intervertebral disc displacement, lumbosacral region: Secondary | ICD-10-CM | POA: Diagnosis not present

## 2017-05-22 DIAGNOSIS — M545 Low back pain: Secondary | ICD-10-CM | POA: Diagnosis not present

## 2017-05-22 DIAGNOSIS — M791 Myalgia, unspecified site: Secondary | ICD-10-CM | POA: Diagnosis not present

## 2017-05-22 DIAGNOSIS — M5137 Other intervertebral disc degeneration, lumbosacral region: Secondary | ICD-10-CM | POA: Diagnosis not present

## 2017-05-22 DIAGNOSIS — R202 Paresthesia of skin: Secondary | ICD-10-CM | POA: Diagnosis not present

## 2017-05-29 DIAGNOSIS — M545 Low back pain: Secondary | ICD-10-CM | POA: Diagnosis not present

## 2017-05-29 DIAGNOSIS — M5417 Radiculopathy, lumbosacral region: Secondary | ICD-10-CM | POA: Diagnosis not present

## 2017-05-29 DIAGNOSIS — M791 Myalgia, unspecified site: Secondary | ICD-10-CM | POA: Diagnosis not present

## 2017-06-05 DIAGNOSIS — M5417 Radiculopathy, lumbosacral region: Secondary | ICD-10-CM | POA: Diagnosis not present

## 2017-06-05 DIAGNOSIS — M545 Low back pain: Secondary | ICD-10-CM | POA: Diagnosis not present

## 2017-06-05 DIAGNOSIS — M791 Myalgia, unspecified site: Secondary | ICD-10-CM | POA: Diagnosis not present

## 2017-06-12 DIAGNOSIS — M545 Low back pain: Secondary | ICD-10-CM | POA: Diagnosis not present

## 2017-06-12 DIAGNOSIS — M5417 Radiculopathy, lumbosacral region: Secondary | ICD-10-CM | POA: Diagnosis not present

## 2017-06-12 DIAGNOSIS — M791 Myalgia, unspecified site: Secondary | ICD-10-CM | POA: Diagnosis not present

## 2017-06-19 DIAGNOSIS — M542 Cervicalgia: Secondary | ICD-10-CM | POA: Diagnosis not present

## 2017-06-19 DIAGNOSIS — M791 Myalgia, unspecified site: Secondary | ICD-10-CM | POA: Diagnosis not present

## 2017-06-19 DIAGNOSIS — M5417 Radiculopathy, lumbosacral region: Secondary | ICD-10-CM | POA: Diagnosis not present

## 2017-06-26 DIAGNOSIS — M791 Myalgia, unspecified site: Secondary | ICD-10-CM | POA: Diagnosis not present

## 2017-06-26 DIAGNOSIS — M542 Cervicalgia: Secondary | ICD-10-CM | POA: Diagnosis not present

## 2017-06-26 DIAGNOSIS — M5417 Radiculopathy, lumbosacral region: Secondary | ICD-10-CM | POA: Diagnosis not present

## 2017-07-03 DIAGNOSIS — M791 Myalgia, unspecified site: Secondary | ICD-10-CM | POA: Diagnosis not present

## 2017-07-03 DIAGNOSIS — M542 Cervicalgia: Secondary | ICD-10-CM | POA: Diagnosis not present

## 2017-07-03 DIAGNOSIS — M5417 Radiculopathy, lumbosacral region: Secondary | ICD-10-CM | POA: Diagnosis not present

## 2017-07-03 DIAGNOSIS — M4726 Other spondylosis with radiculopathy, lumbar region: Secondary | ICD-10-CM | POA: Diagnosis not present

## 2017-07-03 DIAGNOSIS — M5126 Other intervertebral disc displacement, lumbar region: Secondary | ICD-10-CM | POA: Diagnosis not present

## 2017-07-10 DIAGNOSIS — R202 Paresthesia of skin: Secondary | ICD-10-CM | POA: Diagnosis not present

## 2017-07-10 DIAGNOSIS — M542 Cervicalgia: Secondary | ICD-10-CM | POA: Diagnosis not present

## 2017-07-10 DIAGNOSIS — M791 Myalgia, unspecified site: Secondary | ICD-10-CM | POA: Diagnosis not present

## 2017-07-17 DIAGNOSIS — M542 Cervicalgia: Secondary | ICD-10-CM | POA: Diagnosis not present

## 2017-07-17 DIAGNOSIS — M791 Myalgia, unspecified site: Secondary | ICD-10-CM | POA: Diagnosis not present

## 2017-07-17 DIAGNOSIS — R202 Paresthesia of skin: Secondary | ICD-10-CM | POA: Diagnosis not present

## 2017-07-24 DIAGNOSIS — M542 Cervicalgia: Secondary | ICD-10-CM | POA: Diagnosis not present

## 2017-07-24 DIAGNOSIS — M791 Myalgia, unspecified site: Secondary | ICD-10-CM | POA: Diagnosis not present

## 2017-07-24 DIAGNOSIS — R202 Paresthesia of skin: Secondary | ICD-10-CM | POA: Diagnosis not present

## 2017-07-31 DIAGNOSIS — M542 Cervicalgia: Secondary | ICD-10-CM | POA: Diagnosis not present

## 2017-07-31 DIAGNOSIS — M791 Myalgia, unspecified site: Secondary | ICD-10-CM | POA: Diagnosis not present

## 2017-07-31 DIAGNOSIS — R202 Paresthesia of skin: Secondary | ICD-10-CM | POA: Diagnosis not present

## 2017-08-07 DIAGNOSIS — M542 Cervicalgia: Secondary | ICD-10-CM | POA: Diagnosis not present

## 2017-08-07 DIAGNOSIS — M791 Myalgia, unspecified site: Secondary | ICD-10-CM | POA: Diagnosis not present

## 2017-08-07 DIAGNOSIS — R202 Paresthesia of skin: Secondary | ICD-10-CM | POA: Diagnosis not present

## 2017-08-14 DIAGNOSIS — M50223 Other cervical disc displacement at C6-C7 level: Secondary | ICD-10-CM | POA: Diagnosis not present

## 2017-08-14 DIAGNOSIS — M47812 Spondylosis without myelopathy or radiculopathy, cervical region: Secondary | ICD-10-CM | POA: Diagnosis not present

## 2017-08-14 DIAGNOSIS — R202 Paresthesia of skin: Secondary | ICD-10-CM | POA: Diagnosis not present

## 2017-08-21 DIAGNOSIS — M5124 Other intervertebral disc displacement, thoracic region: Secondary | ICD-10-CM | POA: Diagnosis not present

## 2017-08-21 DIAGNOSIS — M50322 Other cervical disc degeneration at C5-C6 level: Secondary | ICD-10-CM | POA: Diagnosis not present

## 2017-08-21 DIAGNOSIS — M50222 Other cervical disc displacement at C5-C6 level: Secondary | ICD-10-CM | POA: Diagnosis not present

## 2017-08-22 NOTE — Progress Notes (Signed)
Assessment and Plan:  Nausea/intermittent early satiety Also requesting colonoscopy, hx of cavernous hemangioma of liver may explain nausea/early satiety but ? Reason for sudden/new onset constellation of vague symptoms with [place] exposure - discussed she may benefit from discussion with GI to r/o etiology other than known liver abnormality/management -     Ambulatory referral to Gastroenterology  Estrogen deficiency S/p hysterectomy; has never had bone density screening; requesting this today -     DG Bone Density; Future  Lump in the groin Unable to appreciate lumps she is indicating beyond typical irregular soft tissue texture of her inner thigh today; no distinct lymph nodes, groin or pelvic mass appreciated, though she is somewhat tender to palpation through her thigh ? Possible right inguinal hernia, otherwise patient perceived lumps in right thigh are indistinct If nothing results from discussion with GI, ?benefit of US/vascular eval  Further disposition pending results of labs. Discussed med's effects and SE's.   Over 30 minutes of exam, counseling, chart review, and critical decision making was performed.   Future Appointments  Date Time Provider [place]  02/21/2018  3:00 PM Vicie Mutters, PA-C [facility] None  03/22/2018 12:00 PM Princess Bruins, MD [facility]    ------------------------------------------------------------------------------------------------------------------   HPI 62 y.o.Caucasian female with hx of liver hemangioma, NASH by imaging, endometriosis, +ANA hx who is relatively new to our office presents to discuss ongoing bizarre symptoms that began suddenly in the last 1-2 years; reports prior to which she was "very healthy."   At her last visit, she reported hx of positive ANA and received massive workup including sed rate, RF, anti-DNA antibody, C3/C4, HLAB27 antigen, which were overall unremarkable, ANA was positive, and  ANA titer of 1:40. She was advised this is likely not a significant finding, but was given the opportunity to discuss with rheumatology who she reports told her she has "fibromyalgia." She is skeptical of this diagnosis.   Today she presents c/o intermittent episodes that "flare" - reports swelling through hands, and thighs/calfs but not ankles or feet; she also endorses nausea without vomiting, intermittent early satiety/poor appetite. She reports BM are typical for her baseline in frequency, consistency and odor without constipation or diarrhea. She reports she has had a colonoscopy, ?2014 in [place], after which she reports she had RLQ pain for several months. She is concerned that care provided there was "questionable" and strongly requesting referral to GI for colonoscopy. She has had CT by GI in [place] on 06/29/2016 which demonstrated diverticuli without diverticulosis, cavernous hemangiomas of liver, normal appendix. Her surgical hx includes laparoscopic endometriosis fulguration x 6 ([id number]), cholecystectomy, and total abdominal hysterectomy in 1998.   she is also concerned regarding "lumps" in her right groin/inner thigh that are tender to touch, and ?bulging/lump in the right groin/pelvis. She reportedly regularly receives chiropractic adjustments as well as "neuro" massage - she reports this is questionably helpful for her symptoms after a flare.   Past Medical History:  Diagnosis Date  . Endometriosis 02/02/2017  . Factor V Leiden ([place]) 02/02/2017  . Liver hemangioma 02/02/2017  . NASH (nonalcoholic steatohepatitis) 02/02/2017     Allergies  Allergen Reactions  . Morphine And Related Swelling    Current Outpatient Medications on File Prior to Visit  Medication Sig  . Ascorbic Acid (VITA-C PO) Take by mouth.  Marland Kitchen BETAINE PO Take by mouth.  . Cyanocobalamin (VITAMIN B12 PO) Take by mouth.  Marland Kitchen glucosamine-chondroitin 500-400 MG tablet Take 1 tablet by mouth 3 (three) times  daily.  Marland Kitchen  MAGNESIUM PO Take by mouth.  . Probiotic Product (ALIGN PO) Take by mouth.  . Vitamin D, Cholecalciferol, 1000 units TABS Take by mouth.  Marland Kitchen BIOTIN PO Take by mouth.   No current facility-administered medications on file prior to visit.     LXB:WIOMBT of Systems  Constitutional: Negative for chills, diaphoresis, fever, malaise/fatigue and weight loss.  HENT: Positive for tinnitus. Negative for congestion, ear discharge, ear pain, hearing loss, sinus pain and sore throat.   Eyes: Negative for blurred vision and double vision.  Respiratory: Negative for cough, sputum production, shortness of breath, wheezing and stridor.   Cardiovascular: Positive for leg swelling (Intermittent, in thighs, calves, and hands). Negative for chest pain, palpitations, orthopnea, claudication and PND.  Gastrointestinal: Positive for abdominal pain (RLQ/groin tenderness) and nausea. Negative for blood in stool, constipation, diarrhea, heartburn, melena and vomiting.  Genitourinary: Negative for dysuria, flank pain, frequency, hematuria and urgency.  Musculoskeletal: Negative for joint pain and myalgias.  Skin: Negative for itching and rash.  Neurological: Negative for dizziness, tingling, sensory change and headaches.  Psychiatric/Behavioral: Negative for depression and substance abuse. The patient is not nervous/anxious and does not have insomnia.     Physical Exam:  BP 112/68   Pulse 70   Temp (!) 97.3 F (36.3 C)   Ht 5' 6.5" (1.689 m)   Wt 171 lb (77.6 kg)   SpO2 98%   BMI 27.19 kg/m   General Appearance: Well nourished, in no apparent distress. Eyes: PERRLA, EOMs, conjunctiva no swelling or erythema Sinuses: No Frontal/maxillary tenderness ENT/Mouth: Ext aud canals clear, TMs without erythema, bulging. No erythema, swelling, or exudate on post pharynx.  Tonsils not swollen or erythematous. Hearing normal.  Neck: Supple, thyroid normal.  Respiratory: Respiratory effort normal, BS equal bilaterally  without rales, rhonchi, wheezing or stridor.  Cardio: RRR with no MRGs. Brisk peripheral pulses without edema.  Abdomen: Soft, + BS.  Mild tenderness to R groin vs pelvic border, no guarding, rebound, appreciable hernia or mass. Lymphatics: No palpable lymphadenopathy Musculoskeletal: Full ROM, no obvious deformity, non-tender, symmetrical strength, normal gait.  Skin: Warm, dry without rashes, lesions, ecchymosis. ? Typical irregular soft tissue texture of right inner thigh, though somewhat tender Neuro: Cranial nerves intact. Normal muscle tone, no cerebellar symptoms. Sensation intact.  Psych: Awake and oriented X 3, normal affect, Insight and Judgment appropriate.    Izora Ribas, NP 5:58 PM [facility]

## 2017-08-23 ENCOUNTER — Encounter: Payer: Self-pay | Admitting: Adult Health

## 2017-08-23 ENCOUNTER — Ambulatory Visit: Payer: 59 | Admitting: Adult Health

## 2017-08-23 ENCOUNTER — Ambulatory Visit: Payer: Self-pay | Admitting: Adult Health

## 2017-08-23 VITALS — BP 112/68 | HR 70 | Temp 97.3°F | Ht 66.5 in | Wt 171.0 lb

## 2017-08-23 DIAGNOSIS — E2839 Other primary ovarian failure: Secondary | ICD-10-CM | POA: Diagnosis not present

## 2017-08-23 DIAGNOSIS — R1909 Other intra-abdominal and pelvic swelling, mass and lump: Secondary | ICD-10-CM

## 2017-08-23 DIAGNOSIS — Z1211 Encounter for screening for malignant neoplasm of colon: Secondary | ICD-10-CM

## 2017-08-23 DIAGNOSIS — R110 Nausea: Secondary | ICD-10-CM

## 2017-08-23 DIAGNOSIS — R6881 Early satiety: Secondary | ICD-10-CM

## 2017-08-28 ENCOUNTER — Encounter: Payer: Self-pay | Admitting: Gastroenterology

## 2017-08-28 DIAGNOSIS — M50322 Other cervical disc degeneration at C5-C6 level: Secondary | ICD-10-CM | POA: Diagnosis not present

## 2017-08-28 DIAGNOSIS — M5124 Other intervertebral disc displacement, thoracic region: Secondary | ICD-10-CM | POA: Diagnosis not present

## 2017-08-28 DIAGNOSIS — M50222 Other cervical disc displacement at C5-C6 level: Secondary | ICD-10-CM | POA: Diagnosis not present

## 2017-08-31 ENCOUNTER — Ambulatory Visit: Payer: 59 | Admitting: Adult Health

## 2017-08-31 ENCOUNTER — Encounter: Payer: Self-pay | Admitting: Adult Health

## 2017-08-31 VITALS — BP 120/76 | HR 80 | Temp 97.3°F | Ht 66.5 in | Wt 170.0 lb

## 2017-08-31 DIAGNOSIS — L710 Perioral dermatitis: Secondary | ICD-10-CM

## 2017-08-31 DIAGNOSIS — R10813 Right lower quadrant abdominal tenderness: Secondary | ICD-10-CM

## 2017-08-31 MED ORDER — TRIAMCINOLONE ACETONIDE 0.1 % EX OINT: 1.0000 "application " | TOPICAL_OINTMENT | Freq: Two times a day (BID) | CUTANEOUS | 1 refills | Status: DC

## 2017-08-31 NOTE — Progress Notes (Signed)
Assessment and Plan:  Right lower quadrant abdominal tenderness without rebound tenderness Exam inconclusive, RLQ [place] - muscle strain vs small inguinal hernia vs other  Pending eval by GI for various vague/bizarre symptoms without conclusive exam/historical imaging, as well as management of liver hemangioma -     US Abdomen Complete; Future  Perioral dermatitis Avoid heavily perfumed creams, apply steroid BID until rash clears, then apply barrier cream at night to prevent excessive drying. Recommended use of gentle eczema-friendly creams for moisture to prevent recurrence.  -     triamcinolone ointment (KENALOG) 0.1 %; Apply 1 application topically 2 (two) times daily.  Further disposition pending results of labs. Discussed med's effects and SE's.   Over 15 minutes of exam, counseling, chart review, and critical decision making was performed.   Future Appointments  Date Time Provider [place]  10/24/2017  2:30 PM Armbruster, Carlota Raspberry, MD [facility]  02/21/2018  3:00 PM Vicie Mutters, PA-C [facility] None  03/22/2018 12:00 PM Princess Bruins, MD [facility]    ------------------------------------------------------------------------------------------------------------------  HPI BP 120/76   Pulse 80   Temp (!) 97.3 F (36.3 C)   Ht 5' 6.5" (1.689 m)   Wt 170 lb (77.1 kg)   SpO2 97%   BMI 27.03 kg/m   62 y.o.female presents for evaluation of perioral rash that has been present for ~2 weeks. The area appears with erythematous base with dry, flaking skin overlying without discharge or distinct lesions. She reports area is intermittently pruritis and occassionally burns. She denies any new creams, detergents, foods, etc, but does not that she wakes up in the AM with drool on her face and pillow, and admits she has been under a lot of stress recently.   She also endorses recently evaluated chornic intermittent RLQ/groin pain (for which she has been referred to GI for  eval along with myriad of vague non-specific symptoms) seems worse, and today she shares that previously in [place] a possible hernia was mentioned. Discuss repeating US today to eval.   Past Medical History:  Diagnosis Date  . Endometriosis 02/02/2017  . Factor V Leiden ([place]) 02/02/2017  . Liver hemangioma 02/02/2017  . NASH (nonalcoholic steatohepatitis) 02/02/2017     Allergies  Allergen Reactions  . Morphine And Related Swelling    Current Outpatient Medications on File Prior to Visit  Medication Sig  . Ascorbic Acid (VITA-C PO) Take by mouth.  Marland Kitchen BETAINE PO Take by mouth.  Marland Kitchen BIOTIN PO Take by mouth.  . Cyanocobalamin (VITAMIN B12 PO) Take by mouth.  Marland Kitchen glucosamine-chondroitin 500-400 MG tablet Take 1 tablet by mouth 3 (three) times daily.  Marland Kitchen MAGNESIUM PO Take by mouth.  . Probiotic Product (ALIGN PO) Take by mouth.  . Vitamin D, Cholecalciferol, 1000 units TABS Take by mouth.   No current facility-administered medications on file prior to visit.     ROS: all negative except above.   Physical Exam:  BP 120/76   Pulse 80   Temp (!) 97.3 F (36.3 C)   Ht 5' 6.5" (1.689 m)   Wt 170 lb (77.1 kg)   SpO2 97%   BMI 27.03 kg/m   General Appearance: Well nourished, in no apparent distress. ENT/Mouth: No erythema, swelling, or exudate on post pharynx.  Tonsils not swollen or erythematous. Hearing normal.  Neck: Supple, thyroid normal.  Respiratory: Respiratory effort normal, BS equal bilaterally without rales, rhonchi, wheezing or stridor.  Cardio: RRR with no MRGs. Brisk peripheral pulses without edema.  Abdomen: Soft, +  BS.  RLQ/groin tender without palpable hernia/mass abnormality, no guarding, no rebound. Lymphatics: Non tender without lymphadenopathy.  Musculoskeletal: Full ROM bilateral hips, normal gait.  Skin: Warm, dry; perioral rash without distinct lesions, erythematous base with dry flaky skin overlying, no discharge, no fissures.  Psych: Awake and oriented X 3,  anxious affect, Insight and Judgment appropriate.     Izora Ribas, NP 2:04 PM [facility]

## 2017-08-31 NOTE — Patient Instructions (Signed)
Apply kenalog/triamcinolone twice daily.  Atopic Dermatitis Atopic dermatitis is a skin disorder that causes inflammation of the skin. This is the most common type of eczema. Eczema is a group of skin conditions that cause the skin to be itchy, red, and swollen. This condition is generally worse during the cooler winter months and often improves during the warm summer months. Symptoms can vary from person to person. Atopic dermatitis usually starts showing signs in infancy and can last through adulthood. This condition cannot be passed from one person to another (non-contagious), but it is more common in families. Atopic dermatitis may not always be present. When it is present, it is called a flare-up. What are the causes? The exact cause of this condition is not known. Flare-ups of the condition may be triggered by:  Contact with something that you are sensitive or allergic to.  Stress.  Certain foods.  Extremely hot or cold weather.  Harsh chemicals and soaps.  Dry air.  Chlorine.  What increases the risk? This condition is more likely to develop in people who have a personal history or family history of eczema, allergies, asthma, or hay fever. What are the signs or symptoms? Symptoms of this condition include:  Dry, scaly skin.  Red, itchy rash.  Itchiness, which can be severe. This may occur before the skin rash. This can make sleeping difficult.  Skin thickening and cracking that can occur over time.  How is this diagnosed? This condition is diagnosed based on your symptoms, a medical history, and a physical exam. How is this treated? There is no cure for this condition, but symptoms can usually be controlled. Treatment focuses on:  Controlling the itchiness and scratching. You may be given medicines, such as antihistamines or steroid creams.  Limiting exposure to things that you are sensitive or allergic to (allergens).  Recognizing situations that cause stress and  developing a plan to manage stress.  If your atopic dermatitis does not get better with medicines, or if it is all over your body (widespread), a treatment using a specific type of light (phototherapy) may be used. Follow these instructions at home: Skin care  Keep your skin well-moisturized. Doing this seals in moisture and helps to prevent dryness. ? Use unscented lotions that have petroleum in them. ? Avoid lotions that contain alcohol or water. They can dry the skin.  Keep baths or showers short (less than 5 minutes) in warm water. Do not use hot water. ? Use mild, unscented cleansers for bathing. Avoid soap and bubble bath. ? Apply a moisturizer to your skin right after a bath or shower.  Do not apply anything to your skin without checking with your health care provider. General instructions  Dress in clothes made of cotton or cotton blends. Dress lightly because heat increases itchiness.  When washing your clothes, rinse your clothes twice so all of the soap is removed.  Avoid any triggers that can cause a flare-up.  Try to manage your stress.  Keep your fingernails cut short.  Avoid scratching. Scratching makes the rash and itchiness worse. It may also result in a skin infection (impetigo) due to a break in the skin caused by scratching.  Take or apply over-the-counter and prescription medicines only as told by your health care provider.  Keep all follow-up visits as told by your health care provider. This is important.  Do not be around people who have cold sores or fever blisters. If you get the infection, it may cause your  atopic dermatitis to worsen. Contact a health care provider if:  Your itchiness interferes with sleep.  Your rash gets worse or it is not better within one week of starting treatment.  You have a fever.  You have a rash flare-up after having contact with someone who has cold sores or fever blisters. Get help right away if:  You develop pus or  soft yellow scabs in the rash area. Summary  This condition causes a red rash and itchy, dry, scaly skin.  Treatment focuses on controlling the itchiness and scratching, limiting exposure to things that you are sensitive or allergic to (allergens), recognizing situations that cause stress, and developing a plan to manage stress.  Keep your skin well-moisturized.  Keep baths or showers shorter than 5 minutes and use warm water. Do not use hot water. This information is not intended to replace advice given to you by your health care provider. Make sure you discuss any questions you have with your health care provider. Document Released: 04/01/2000 Document Revised: 05/06/2016 Document Reviewed: 05/06/2016 Elsevier Interactive Patient Education  [employer].

## 2017-09-04 DIAGNOSIS — M50322 Other cervical disc degeneration at C5-C6 level: Secondary | ICD-10-CM | POA: Diagnosis not present

## 2017-09-04 DIAGNOSIS — M50222 Other cervical disc displacement at C5-C6 level: Secondary | ICD-10-CM | POA: Diagnosis not present

## 2017-09-04 DIAGNOSIS — M5124 Other intervertebral disc displacement, thoracic region: Secondary | ICD-10-CM | POA: Diagnosis not present

## 2017-09-11 ENCOUNTER — Encounter: Payer: Self-pay | Admitting: Adult Health

## 2017-09-13 DIAGNOSIS — M5124 Other intervertebral disc displacement, thoracic region: Secondary | ICD-10-CM | POA: Diagnosis not present

## 2017-09-13 DIAGNOSIS — M50322 Other cervical disc degeneration at C5-C6 level: Secondary | ICD-10-CM | POA: Diagnosis not present

## 2017-09-13 DIAGNOSIS — M50222 Other cervical disc displacement at C5-C6 level: Secondary | ICD-10-CM | POA: Diagnosis not present

## 2017-09-18 DIAGNOSIS — M50222 Other cervical disc displacement at C5-C6 level: Secondary | ICD-10-CM | POA: Diagnosis not present

## 2017-09-18 DIAGNOSIS — M50322 Other cervical disc degeneration at C5-C6 level: Secondary | ICD-10-CM | POA: Diagnosis not present

## 2017-09-18 DIAGNOSIS — M5124 Other intervertebral disc displacement, thoracic region: Secondary | ICD-10-CM | POA: Diagnosis not present

## 2017-09-19 ENCOUNTER — Ambulatory Visit
Admission: RE | Admit: 2017-09-19 | Discharge: 2017-09-19 | Disposition: A | Discharge status: Home / Self Care | Payer: 59 | Source: Ambulatory Visit | Attending: Adult Health | Admitting: Adult Health

## 2017-09-19 ENCOUNTER — Other Ambulatory Visit: Payer: Self-pay | Admitting: Adult Health

## 2017-09-19 DIAGNOSIS — R10813 Right lower quadrant abdominal tenderness: Secondary | ICD-10-CM

## 2017-09-19 DIAGNOSIS — R1031 Right lower quadrant pain: Secondary | ICD-10-CM

## 2017-09-25 ENCOUNTER — Encounter: Payer: Self-pay | Admitting: Internal Medicine

## 2017-09-25 DIAGNOSIS — M5124 Other intervertebral disc displacement, thoracic region: Secondary | ICD-10-CM | POA: Diagnosis not present

## 2017-09-25 DIAGNOSIS — M50222 Other cervical disc displacement at C5-C6 level: Secondary | ICD-10-CM | POA: Diagnosis not present

## 2017-09-25 DIAGNOSIS — M50322 Other cervical disc degeneration at C5-C6 level: Secondary | ICD-10-CM | POA: Diagnosis not present

## 2017-09-26 ENCOUNTER — Other Ambulatory Visit: Payer: Self-pay | Admitting: Adult Health

## 2017-09-26 DIAGNOSIS — R10813 Right lower quadrant abdominal tenderness: Secondary | ICD-10-CM

## 2017-09-26 DIAGNOSIS — Z8742 Personal history of other diseases of the female genital tract: Secondary | ICD-10-CM

## 2017-10-11 ENCOUNTER — Ambulatory Visit
Admission: RE | Admit: 2017-10-11 | Discharge: 2017-10-11 | Disposition: A | Discharge status: Home / Self Care | Payer: 59 | Source: Ambulatory Visit | Attending: Adult Health | Admitting: Adult Health

## 2017-10-11 ENCOUNTER — Other Ambulatory Visit: Payer: Self-pay

## 2017-10-11 DIAGNOSIS — Z8742 Personal history of other diseases of the female genital tract: Secondary | ICD-10-CM

## 2017-10-11 DIAGNOSIS — R10813 Right lower quadrant abdominal tenderness: Secondary | ICD-10-CM

## 2017-10-11 DIAGNOSIS — R1031 Right lower quadrant pain: Secondary | ICD-10-CM | POA: Diagnosis not present

## 2017-10-17 ENCOUNTER — Other Ambulatory Visit: Payer: Self-pay | Admitting: Gynecology

## 2017-10-17 ENCOUNTER — Other Ambulatory Visit: Payer: Self-pay | Admitting: Obstetrics & Gynecology

## 2017-10-17 DIAGNOSIS — Z13820 Encounter for screening for osteoporosis: Secondary | ICD-10-CM

## 2017-10-24 ENCOUNTER — Ambulatory Visit: Payer: 59 | Admitting: Gastroenterology

## 2017-10-24 ENCOUNTER — Encounter

## 2017-10-24 ENCOUNTER — Encounter: Payer: Self-pay | Admitting: Gastroenterology

## 2017-10-24 VITALS — BP 110/80 | HR 72 | Ht 66.5 in | Wt 167.2 lb

## 2017-10-24 DIAGNOSIS — K5900 Constipation, unspecified: Secondary | ICD-10-CM | POA: Diagnosis not present

## 2017-10-24 DIAGNOSIS — K219 Gastro-esophageal reflux disease without esophagitis: Secondary | ICD-10-CM | POA: Diagnosis not present

## 2017-10-24 DIAGNOSIS — K760 Fatty (change of) liver, not elsewhere classified: Secondary | ICD-10-CM

## 2017-10-24 DIAGNOSIS — Z860100 Personal history of colon polyps, unspecified: Secondary | ICD-10-CM

## 2017-10-24 DIAGNOSIS — R1031 Right lower quadrant pain: Secondary | ICD-10-CM | POA: Diagnosis not present

## 2017-10-24 MED ORDER — PEG-KCL-NACL-NASULF-NA ASC-C 140 G PO SOLR: 1.0000 | Freq: Once | ORAL | 0 refills | Status: AC

## 2017-10-24 NOTE — Patient Instructions (Addendum)
If you are age 60 or older, your body mass index should be between 23-30. Your Body mass index is 26.59 kg/m. If this is out of the aforementioned range listed, please consider follow up with your Primary Care Provider.  If you are age 73 or younger, your body mass index should be between 19-25. Your Body mass index is 26.59 kg/m. If this is out of the aformentioned range listed, please consider follow up with your Primary Care Provider.   You have been scheduled for a colonoscopy. Please follow written instructions given to you at your visit today.  Please pick up your prep supplies at the pharmacy within the next 1-3 days. If you use inhalers (even only as needed), please bring them with you on the day of your procedure. Your physician has requested that you go to www.startemmi.com and enter the access code given to you at your visit today. This web site gives a general overview about your procedure. However, you should still follow specific instructions given to you by our office regarding your preparation for the procedure.  Please purchase the following medications over the counter and take as directed: Miralax - take daily for constipation  Use Pepcid as needed.  Thank you for entrusting me with your care and for choosing [facility], Dr. Keswick Cellar

## 2017-10-24 NOTE — Progress Notes (Signed)
HPI :  62 y/o female with history of Factor V Leiden mutation, NAFLD, hepatic hemangiomas, here to establish GI for some of these issues, referred by Liane Comber, NP.  Patient states she had a colonoscopy done in 2014 when she was in [place]. She reported to have multiple polyps removed, but she does not have an report of this. She states it was done by a Psychologist, sport and exercise on a gastroenterologist. She has attempted to get the reports but to no avail. She is concerned that she is due for a colonoscopy for surveillance purposes at this time.  She has some right inguinal pain that has been ongoing for years since her last colonoscopy. She has had CT scans for this as below which have not shown a clear cause. She states this is present in the anterior right inguinal region. It's pretty much of there most of the time, it is sensitive to touch at times, he seems to make it better as well as stretching. She denies any changes of her pain relation to bowel habits. She denies any significant blood in her stool. She does have rare scant bleeding which she attributes to hemorrhoids. Her bowels are regular mostly time, and she has some periods of constipation at times. She takes align routinely. She takes MiraLAX as needed and this tends to work fairly well.  Her grandmother had colon cancer diagnosed in her 60s, no first-degree relatives with colon cancer.  She has had a history of GERD, she reports mostly she feels symptoms at night, and awakens with sore throat in the morning. She also has some occasional belching. She has been using Pepcid which works well for her. She was told to try apple cider vinegar. She is not on PPI. She had an EGD in 2014, report available for review, no evidence of Barrett's.  She reports being told she has a history of fatty liver, and possible Karlene Lineman. She does never had a liver biopsy. She denies any significant history of alcohol use. She previously endorses her liver enzymes were elevated,  however most recently her ALT was normal. She's had multiple imaging studies in the past. She has fatty liver noted on prior ultrasound. She also has small hepatic hemangiomas noted that been stable over time.  Prior workup: EGD 10/26/2012 - "gastritis and mild esophagitis" - no further details provided, done in [place], biopsies negative for HP [place] 12/2012 - fatty liver, suspected hepatic hemangiomas,  CT abdomen 06/29/2016 - hepatic hemangiomas, post chole, diverticulosis US pelvis - 10/11/17 - no inguinal hernia, s/p hysterectomy    Past Medical History:  Diagnosis Date  . Colon polyps   . Elevated cholesterol   . Endometriosis 02/02/2017  . Factor V Leiden ([place]) 02/02/2017  . IBS (irritable bowel syndrome)   . Liver hemangioma 02/02/2017  . NASH (nonalcoholic steatohepatitis) 02/02/2017     Past Surgical History:  Procedure Laterality Date  . ABDOMINAL HYSTERECTOMY  1998   TOTAL, on estrogen  until 2012  . BACK SURGERY  2000   disc removed L4L5  . CHOLECYSTECTOMY    . [place]  . LAPAROSCOPIC ENDOMETRIOSIS FULGURATION     x 6, 1980-1991   Family History  Problem Relation Age of Onset  . Cirrhosis Mother   . Diabetes Mother   . Arthritis Mother   . Gout Mother   . Depression Mother   . Heart disease Father   . Diabetes Father   . Valvular heart disease Sister   .  Depression Sister   . Diabetes Brother   . Hyperlipidemia Son   . Cancer Maternal Grandmother 54       colon cancer  . Atrial fibrillation Brother   . Valvular heart disease Brother   . Hypertension Brother    Social History   Tobacco Use  . Smoking status: Never Smoker  . Smokeless tobacco: Never Used  Substance Use Topics  . Alcohol use: Yes    Comment: OCC   . Drug use: Never   Current Outpatient Medications  Medication Sig Dispense Refill  . aspirin EC 81 MG tablet Take 81 mg by mouth daily.    Marland Kitchen b complex vitamins tablet Take 1 tablet by mouth daily.    Marland Kitchen BIOTIN PO Take 5,000  mcg by mouth.    . Cholecalciferol (VITAMIN D) 2000 units tablet Take 2,000 Units by mouth daily.    . famotidine (PEPCID) 10 MG tablet Take 10 mg by mouth daily.    . folic acid (FOLVITE) 235 MCG tablet Take 400 mcg by mouth daily.    . Magnesium 400 MG TABS Take by mouth daily.    . NON FORMULARY [place]- Herbal Supplements. Zyflamend for inflamation    . NON FORMULARY [place]- Whole Meg fish oil    . Probiotic Product (ALIGN) 4 MG CAPS Take by mouth daily.    Marland Kitchen thiamine (VITAMIN B-1) 100 MG tablet Take 100 mg by mouth daily.    . vitamin C (ASCORBIC ACID) 500 MG tablet Take 500 mg by mouth daily.    Marland Kitchen ZINC-VITAMIN C PO Take by mouth daily.     No current facility-administered medications for this visit.    Allergies  Allergen Reactions  . Morphine And Related Swelling     Review of Systems: All systems reviewed and negative except where noted in HPI.   Lab Results  Component Value Date   WBC 6.1 02/02/2017   HGB 13.3 02/02/2017   HCT 38.8 02/02/2017   MCV 90.2 02/02/2017   PLT 254 02/02/2017    Lab Results  Component Value Date   CREATININE 1.05 02/02/2017   BUN 26 (H) 02/02/2017   NA 140 02/02/2017   K 4.0 02/02/2017   CL 105 02/02/2017   CO2 25 02/02/2017    Lab Results  Component Value Date   ALT 18 02/02/2017   AST 18 02/02/2017   BILITOT 0.6 02/02/2017   No results found for: INR, PROTIME   Physical Exam: BP 110/80   Pulse 72   Ht 5' 6.5" (1.689 m)   Wt 167 lb 4 oz (75.9 kg)   BMI 26.59 kg/m  Constitutional: Pleasant,well-developed, female in no acute distress. HEENT: Normocephalic and atraumatic. Conjunctivae are normal. No scleral icterus. Neck supple.  Cardiovascular: Normal rate, regular rhythm.  Pulmonary/chest: Effort normal and breath sounds normal. No wheezing, rales or rhonchi. Abdominal: Soft, nondistended, nontender. . There are no masses palpable. No hepatomegaly. Some pain over R inguinal area / hip Extremities: no  edema Lymphadenopathy: No cervical adenopathy noted. Neurological: Alert and oriented to person place and time. Skin: Skin is warm and dry. No rashes noted. Psychiatric: Normal mood and affect. Behavior is normal.   ASSESSMENT AND PLAN: 62 year old female here for new patient visit to discuss the following issues:  Right inguinal pain - ongoing for years, since her last colonoscopy. Imaging has not shown any clear cause for her discomfort. As above given that this appears to be somewhat positional, suspect this is musculoskeletal,  and could represent anterior hip pain. We discussed this for a bit. We will await her colonoscopy as outlined below, however I think it will be unlikely to show clear cause for her pain. I offered imaging of her hip with x-rays today, she declined and will follow up with her primary care regarding that.  GERD - prior endoscopy with very mild esophagitis, no evidence of Barrett's esophagus. We discussed options moving forward. Pepcid appears to work well for her, I would continue the when necessary. I don't feel she warrants PPI therapy if Pepcid is working well. She asked about a couple cider vinegar, she can try this if she wishes to get off of Pepcid, however discussed that there is no a lot of data looking at the use of this for reflux and Pepcid is a very safe medicine with minimal long-term risks.  Fatty liver - noted on remote imaging. She denies a history of alcohol use. Her LFTs are most recently normal. She has some benign small hepatic hemangiomas noted on CT scan, I reassured her about these. I discussed the spectrum of liver disease associated with fatty liver and long term associated risk for cirrhosis. Given her normal liver enzymes I think it very unlikely that she has Karlene Lineman, she's never had a liver biopsy. Recommend healthy diet and exercise, but denies alcohol use, follow LFTs yearly for this issue.  Constipation - if this bothers her recommend she use  MiraLAX routinely if she needs to. Counseled this is safe and has been effective for her in the past.  History of colon polyps - last colonoscopy done in [place] 5 years ago with polyps removed, we'll not be able to obtain these results. I offered her surveillance colonoscopy given her history of polyps. I discussed the risks and benefits of colonoscopy with her and she wanted to proceed. Further recommendations pending the results.  [place] Cellar, MD [facility]  CC: Liane Comber, NP

## 2017-10-24 NOTE — Telephone Encounter (Signed)
A user error has taken place: ERROR °

## 2017-10-25 ENCOUNTER — Encounter (INDEPENDENT_AMBULATORY_CARE_PROVIDER_SITE_OTHER): Payer: Self-pay

## 2017-10-25 DIAGNOSIS — M791 Myalgia, unspecified site: Secondary | ICD-10-CM | POA: Diagnosis not present

## 2017-10-25 DIAGNOSIS — R768 Other specified abnormal immunological findings in serum: Secondary | ICD-10-CM | POA: Diagnosis not present

## 2017-10-25 DIAGNOSIS — M2550 Pain in unspecified joint: Secondary | ICD-10-CM | POA: Diagnosis not present

## 2017-10-26 ENCOUNTER — Other Ambulatory Visit: Payer: Self-pay | Admitting: Adult Health

## 2017-10-26 DIAGNOSIS — M25551 Pain in right hip: Secondary | ICD-10-CM

## 2017-10-30 ENCOUNTER — Ambulatory Visit (HOSPITAL_COMMUNITY)
Admission: RE | Admit: 2017-10-30 | Discharge: 2017-10-30 | Disposition: A | Discharge status: Home / Self Care | Payer: 59 | Source: Ambulatory Visit | Attending: Adult Health | Admitting: Adult Health

## 2017-10-30 ENCOUNTER — Other Ambulatory Visit: Payer: Self-pay | Admitting: Adult Health

## 2017-10-30 ENCOUNTER — Ambulatory Visit (INDEPENDENT_AMBULATORY_CARE_PROVIDER_SITE_OTHER): Payer: 59

## 2017-10-30 DIAGNOSIS — M25551 Pain in right hip: Secondary | ICD-10-CM

## 2017-10-30 DIAGNOSIS — Z13820 Encounter for screening for osteoporosis: Secondary | ICD-10-CM

## 2017-10-30 IMAGING — CR DG HIP (WITH OR WITHOUT PELVIS) 2-3V*R*
2 series · 2 of 2 positions shown · non-contrast
Comparison: None.

CLINICAL DATA: Right hip pain for over 6 months with recent
worsening. No reported injury.

EXAM:
DG HIP (WITH OR WITHOUT PELVIS) 2-3V RIGHT

[hip ap]
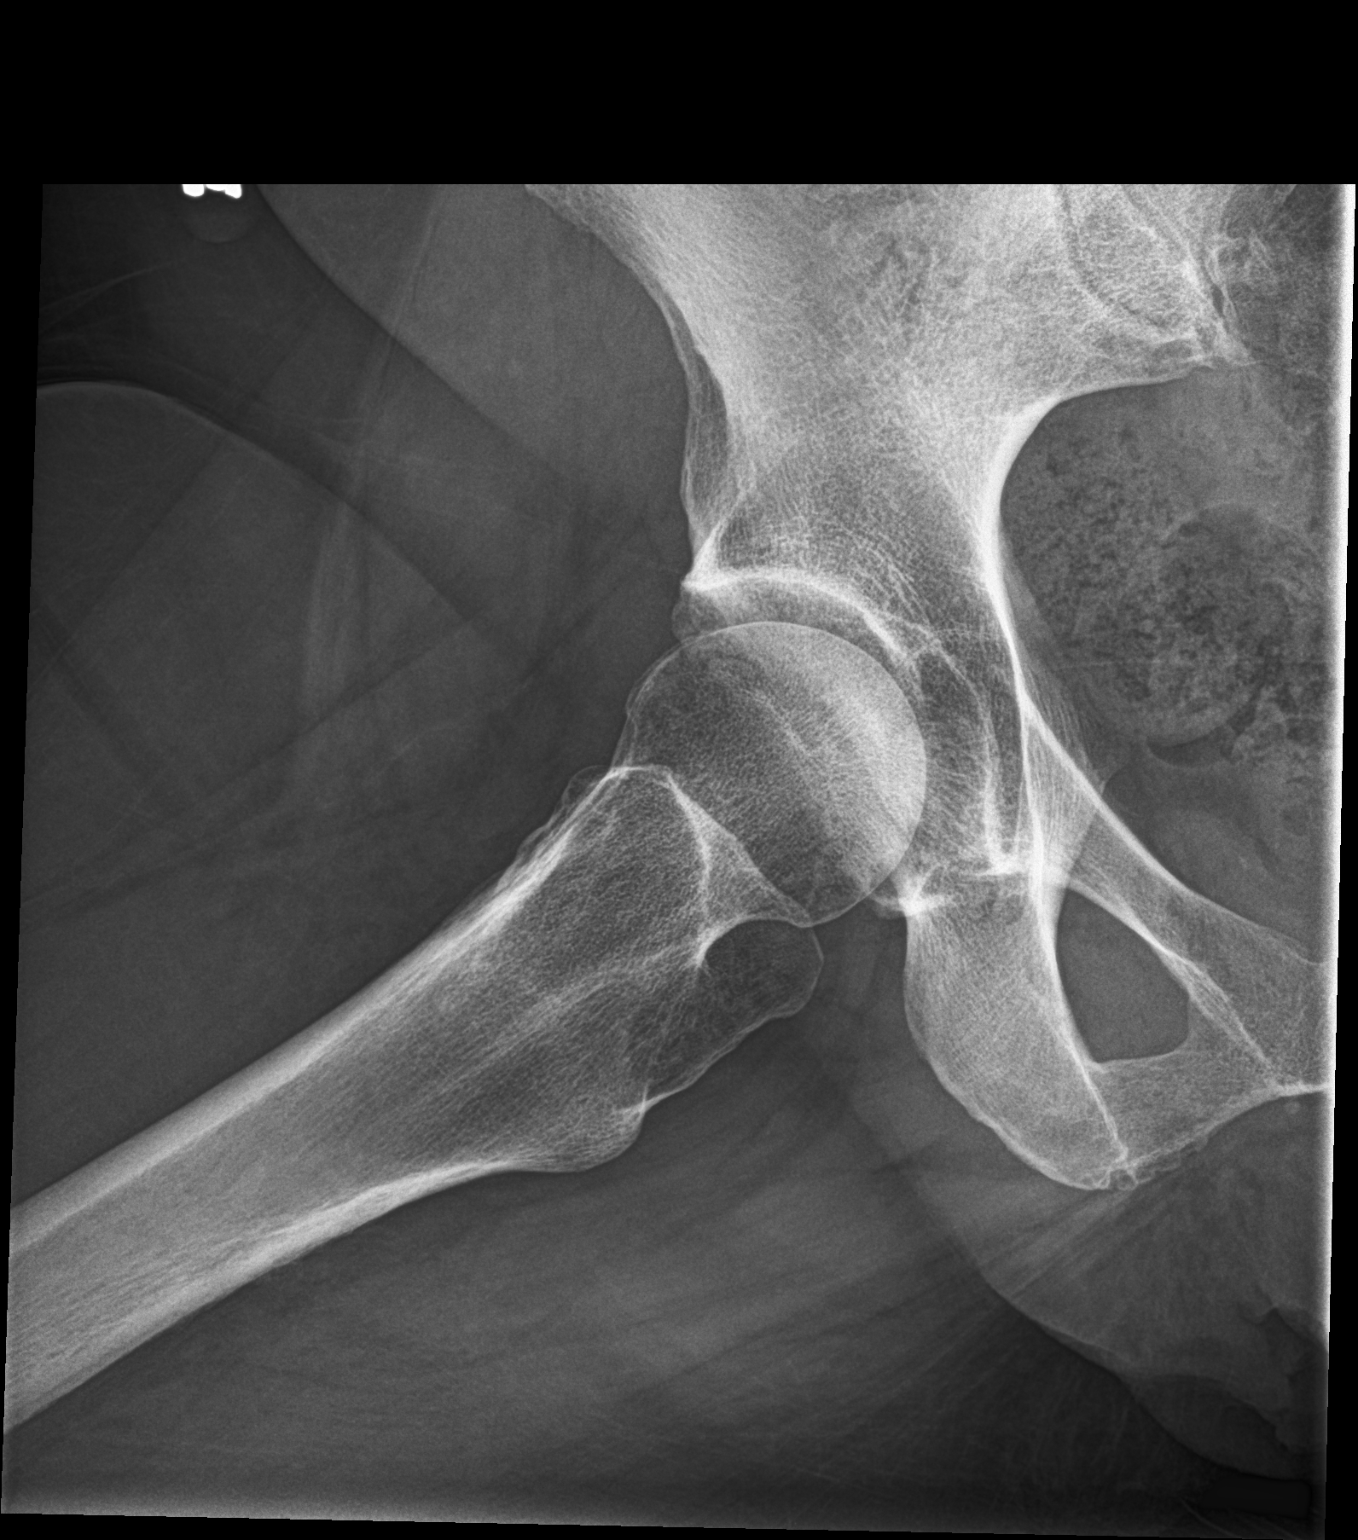

[hip frog leg]
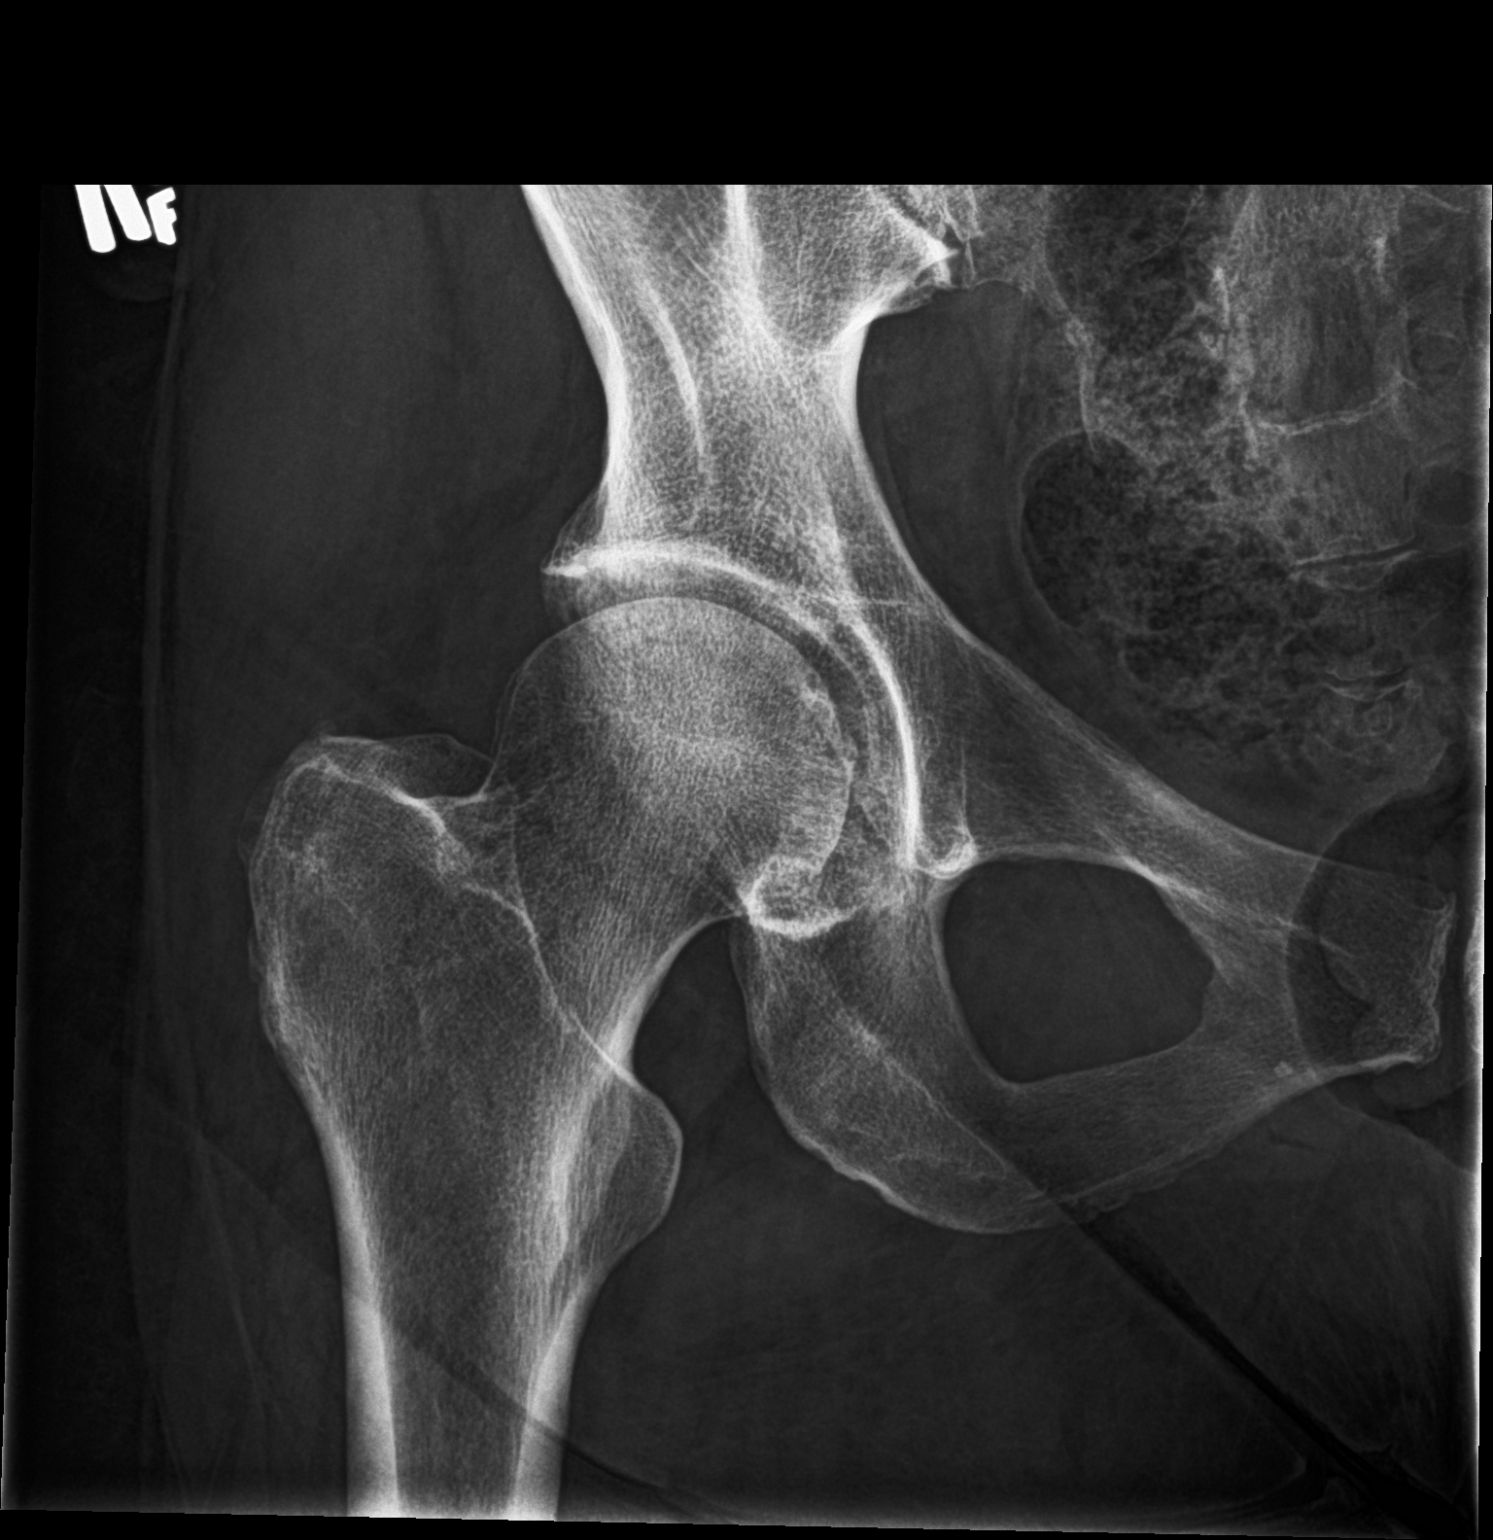

[2 of 2 positions shown; findings below may reference images not displayed]

FINDINGS: No right hip fracture or dislocation. No suspicious focal osseous
lesion. No significant hip arthropathy. No radiopaque foreign body.
IMPRESSION: No right hip fracture, malalignment or significant arthropathy.

## 2017-10-31 ENCOUNTER — Encounter: Payer: Self-pay | Admitting: Gynecology

## 2017-12-11 ENCOUNTER — Encounter: Payer: Self-pay | Admitting: Gastroenterology

## 2017-12-25 ENCOUNTER — Encounter: Payer: Self-pay | Admitting: Gastroenterology

## 2017-12-25 ENCOUNTER — Ambulatory Visit (AMBULATORY_SURGERY_CENTER): Payer: 59 | Admitting: Gastroenterology

## 2017-12-25 VITALS — BP 130/69 | HR 57 | Temp 98.0°F | Resp 14 | Ht 66.6 in | Wt 167.0 lb

## 2017-12-25 DIAGNOSIS — D120 Benign neoplasm of cecum: Secondary | ICD-10-CM

## 2017-12-25 DIAGNOSIS — D123 Benign neoplasm of transverse colon: Secondary | ICD-10-CM | POA: Diagnosis not present

## 2017-12-25 DIAGNOSIS — Z86010 Personal history of colonic polyps: Secondary | ICD-10-CM | POA: Diagnosis not present

## 2017-12-25 MED ORDER — SODIUM CHLORIDE 0.9 % IV SOLN: 500.0000 mL | Freq: Once | INTRAVENOUS | Status: DC

## 2017-12-25 NOTE — Patient Instructions (Signed)
HANDOUTs given : Polyps and Diverticulosis    YOU HAD AN ENDOSCOPIC PROCEDURE TODAY AT [facility]:   Refer to the procedure report that was given to you for any specific questions about what was found during the examination.  If the procedure report does not answer your questions, please call your gastroenterologist to clarify.  If you requested that your care partner not be given the details of your procedure findings, then the procedure report has been included in a sealed envelope for you to review at your convenience later.  YOU SHOULD EXPECT: Some feelings of bloating in the abdomen. Passage of more gas than usual.  Walking can help get rid of the air that was put into your GI tract during the procedure and reduce the bloating. If you had a lower endoscopy (such as a colonoscopy or flexible sigmoidoscopy) you may notice spotting of blood in your stool or on the toilet paper. If you underwent a bowel prep for your procedure, you may not have a normal bowel movement for a few days.  Please Note:  You might notice some irritation and congestion in your nose or some drainage.  This is from the oxygen used during your procedure.  There is no need for concern and it should clear up in a day or so.  SYMPTOMS TO REPORT IMMEDIATELY:   Following lower endoscopy (colonoscopy or flexible sigmoidoscopy):  Excessive amounts of blood in the stool  Significant tenderness or worsening of abdominal pains  Swelling of the abdomen that is new, acute  Fever of 100F or higher   For urgent or emergent issues, a gastroenterologist can be reached at any hour by calling [phone].   DIET:  We do recommend a small meal at first, but then you may proceed to your regular diet.  Drink plenty of fluids but you should avoid alcoholic beverages for 24 hours.  ACTIVITY:  You should plan to take it easy for the rest of today and you should NOT DRIVE or use heavy machinery until tomorrow (because  of the sedation medicines used during the test).    FOLLOW UP: Our staff will call the number listed on your records the next business day following your procedure to check on you and address any questions or concerns that you may have regarding the information given to you following your procedure. If we do not reach you, we will leave a message.  However, if you are feeling well and you are not experiencing any problems, there is no need to return our call.  We will assume that you have returned to your regular daily activities without incident.  If any biopsies were taken you will be contacted by phone or by letter within the next 1-3 weeks.  Please call us at [phone] if you have not heard about the biopsies in 3 weeks.    SIGNATURES/CONFIDENTIALITY: You and/or your care partner have signed paperwork which will be entered into your electronic medical record.  These signatures attest to the fact that that the information above on your After Visit Summary has been reviewed and is understood.  Full responsibility of the confidentiality of this discharge information lies with you and/or your care-partner.

## 2017-12-25 NOTE — Progress Notes (Signed)
Report to PACU, RN, vss, BBS= Clear.  

## 2017-12-25 NOTE — Op Note (Signed)
[facility] Patient Name: Samantha Mckinney Procedure Date: 12/25/2017 11:50 AM MRN: [id number] Endoscopist: Remo Lipps P. Havery Moros , MD Age: 62 Referring MD:  Date of Birth: [birth year] Gender: Female Account #: [id number] Procedure:                Colonoscopy Indications:              High risk colon cancer surveillance: Personal                            history of colonic polyps Medicines:                Monitored Anesthesia Care Procedure:                Pre-Anesthesia Assessment:                           - Prior to the procedure, a History and Physical                            was performed, and patient medications and                            allergies were reviewed. The patient's tolerance of                            previous anesthesia was also reviewed. The risks                            and benefits of the procedure and the sedation                            options and risks were discussed with the patient.                            All questions were answered, and informed consent                            was obtained. Prior Anticoagulants: The patient has                            taken no previous anticoagulant or antiplatelet                            agents. ASA Grade Assessment: II - A patient with                            mild systemic disease. After reviewing the risks                            and benefits, the patient was deemed in                            satisfactory condition to undergo the procedure.  After obtaining informed consent, the colonoscope                            was passed under direct vision. Throughout the                            procedure, the patient's blood pressure, pulse, and                            oxygen saturations were monitored continuously. The                            Model [id number]-[id number] scope was introduced                            through the anus and advanced to  the the terminal                            ileum, with identification of the appendiceal                            orifice and IC valve. The colonoscopy was performed                            without difficulty. The patient tolerated the                            procedure well. The quality of the bowel                            preparation was good. The terminal ileum, ileocecal                            valve, appendiceal orifice, and rectum were                            photographed. Scope In: 11:57:14 AM Scope Out: 12:20:24 PM Scope Withdrawal Time: 0 hours 16 minutes 12 seconds  Total Procedure Duration: 0 hours 23 minutes 10 seconds  Findings:                 The perianal and digital rectal examinations were                            normal.                           The terminal ileum appeared normal.                           A 3 mm polyp was found in the ileocecal valve. The                            polyp was flat. The polyp was removed with a cold  biopsy forceps. Resection and retrieval were                            complete.                           A 3 mm polyp was found in the transverse colon. The                            polyp was flat. The polyp was removed with a cold                            biopsy forceps. Resection and retrieval were                            complete.                           Multiple medium-mouthed diverticula were found in                            the sigmoid colon.                           The colon was tortuous.                           The exam was otherwise without abnormality. Complications:            No immediate complications. Estimated blood loss:                            Minimal. Estimated Blood Loss:     Estimated blood loss was minimal. Impression:               - The examined portion of the ileum was normal.                           - One 3 mm polyp at the ileocecal valve,  removed                            with a cold biopsy forceps. Resected and retrieved.                           - One 3 mm polyp in the transverse colon, removed                            with a cold biopsy forceps. Resected and retrieved.                           - Diverticulosis in the sigmoid colon.                           - Tortuous colon.                           -  The examination was otherwise normal. Recommendation:           - Patient has a contact number available for                            emergencies. The signs and symptoms of potential                            delayed complications were discussed with the                            patient. Return to normal activities tomorrow.                            Written discharge instructions were provided to the                            patient.                           - Resume previous diet.                           - Continue present medications.                           - Await pathology results.                           - Repeat colonoscopy for surveillance based on                            pathology results. Remo Lipps P. Armbruster, MD 12/25/2017 12:24:32 PM This report has been signed electronically.

## 2017-12-25 NOTE — Progress Notes (Signed)
Called to room to assist during endoscopic procedure.  Patient ID and intended procedure confirmed with present staff. Received instructions for my participation in the procedure from the performing physician.  

## 2017-12-26 ENCOUNTER — Telehealth: Payer: Self-pay

## 2017-12-26 NOTE — Telephone Encounter (Signed)
  Follow up Call-  Call back number 12/25/2017  Post procedure Call Back phone  # [phone]  Permission to leave phone message Yes     Patient questions:  Do you have a fever, pain , or abdominal swelling? No. Pain Score  0 *  Have you tolerated food without any problems? Yes.    Have you been able to return to your normal activities? Yes.    Do you have any questions about your discharge instructions: Diet   No. Medications  No. Follow up visit  No.  Do you have questions or concerns about your Care? No.  Actions: * If pain score is 4 or above: No action needed, pain <4.

## 2018-01-23 ENCOUNTER — Other Ambulatory Visit: Payer: Self-pay | Admitting: Physician Assistant

## 2018-01-23 DIAGNOSIS — Z1231 Encounter for screening mammogram for malignant neoplasm of breast: Secondary | ICD-10-CM

## 2018-01-24 ENCOUNTER — Encounter: Payer: Self-pay | Admitting: Physician Assistant

## 2018-01-24 ENCOUNTER — Ambulatory Visit: Payer: 59 | Admitting: Physician Assistant

## 2018-01-24 ENCOUNTER — Ambulatory Visit (HOSPITAL_COMMUNITY)
Admission: RE | Admit: 2018-01-24 | Discharge: 2018-01-24 | Disposition: A | Discharge status: Home / Self Care | Payer: 59 | Source: Ambulatory Visit | Attending: Physician Assistant | Admitting: Physician Assistant

## 2018-01-24 VITALS — BP 126/80 | HR 69 | Temp 97.7°F | Ht 66.6 in | Wt 168.4 lb

## 2018-01-24 DIAGNOSIS — G8929 Other chronic pain: Secondary | ICD-10-CM

## 2018-01-24 DIAGNOSIS — M5442 Lumbago with sciatica, left side: Secondary | ICD-10-CM

## 2018-01-24 DIAGNOSIS — M5136 Other intervertebral disc degeneration, lumbar region: Secondary | ICD-10-CM | POA: Insufficient documentation

## 2018-01-24 DIAGNOSIS — M545 Low back pain: Secondary | ICD-10-CM | POA: Diagnosis not present

## 2018-01-24 IMAGING — CR DG LUMBAR SPINE COMPLETE 4+V
5 series · 5 of 5 positions shown · non-contrast
Comparison: None

CLINICAL DATA: Chronic LEFT side low back pain and LEFT sciatica,
prior L4-L5 surgery

EXAM:
LUMBAR SPINE - COMPLETE 4+ VIEW

[l-spine obl (1 of 2)]
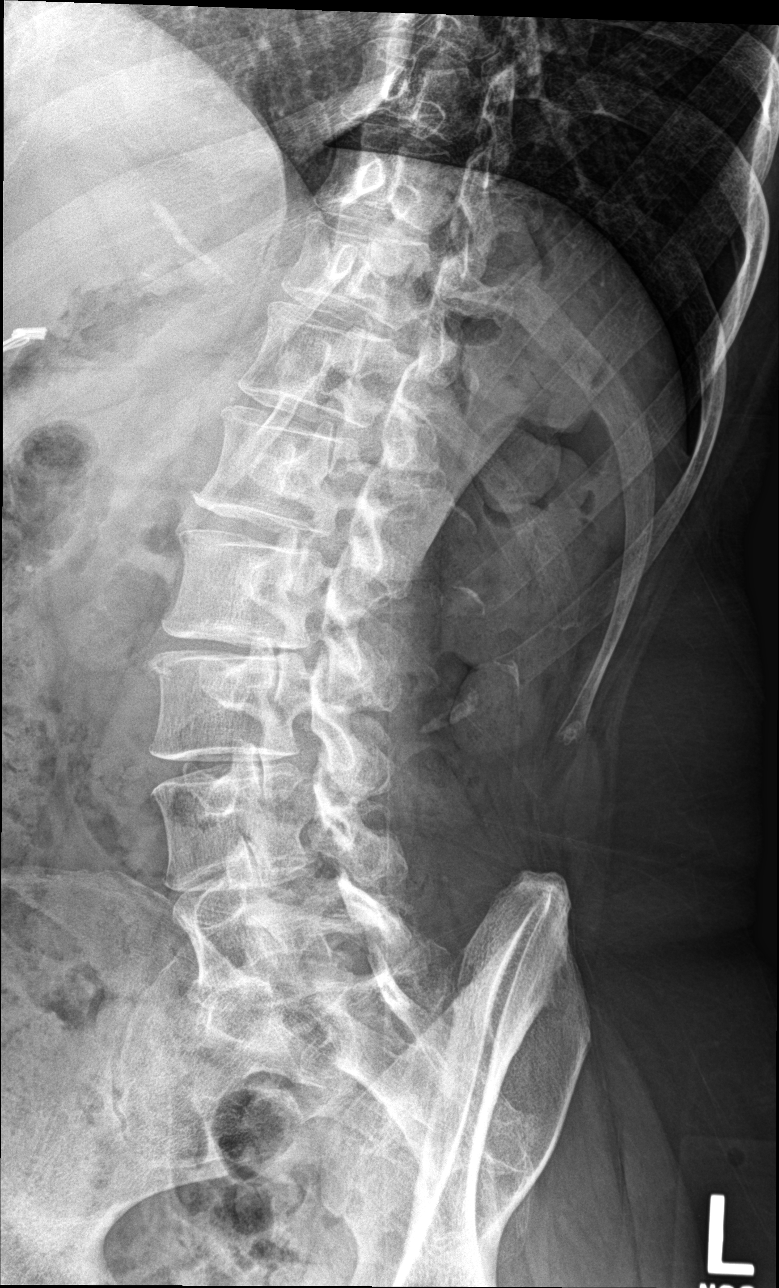

[l-spine obl (2 of 2)]
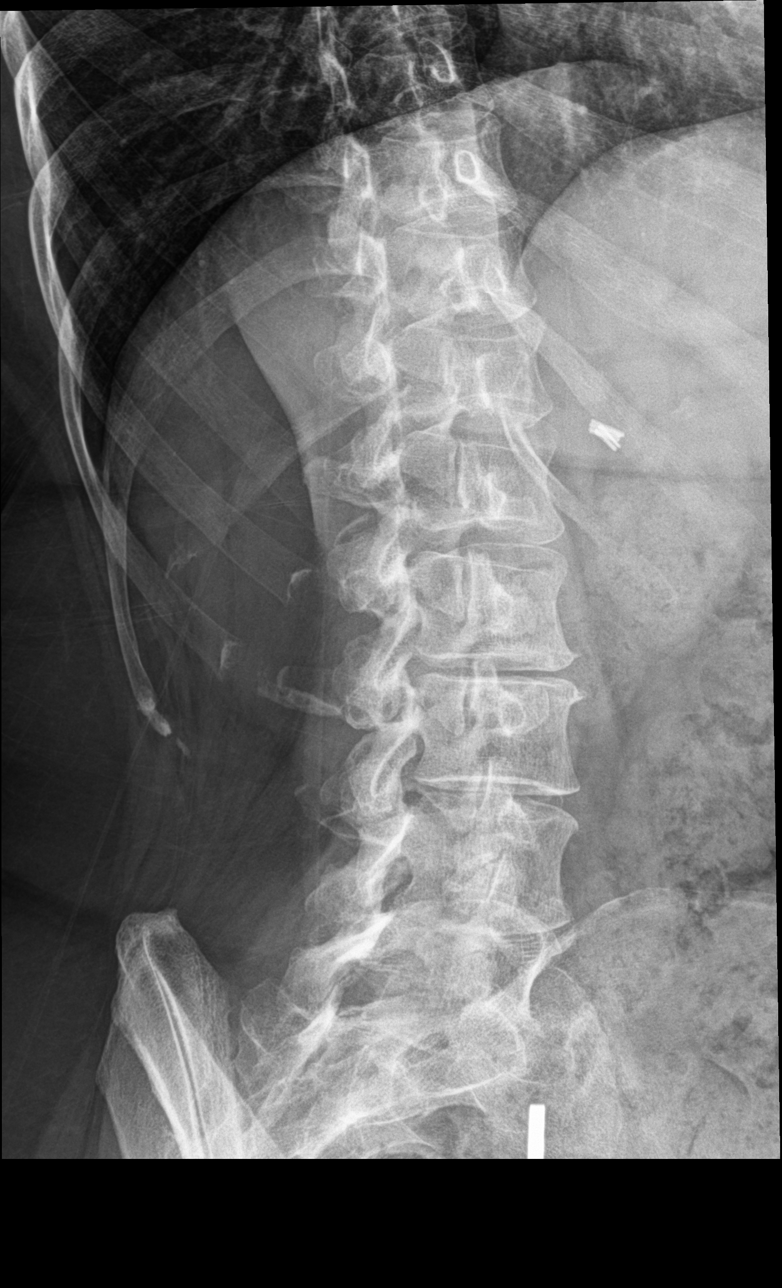

[l-spine lat]
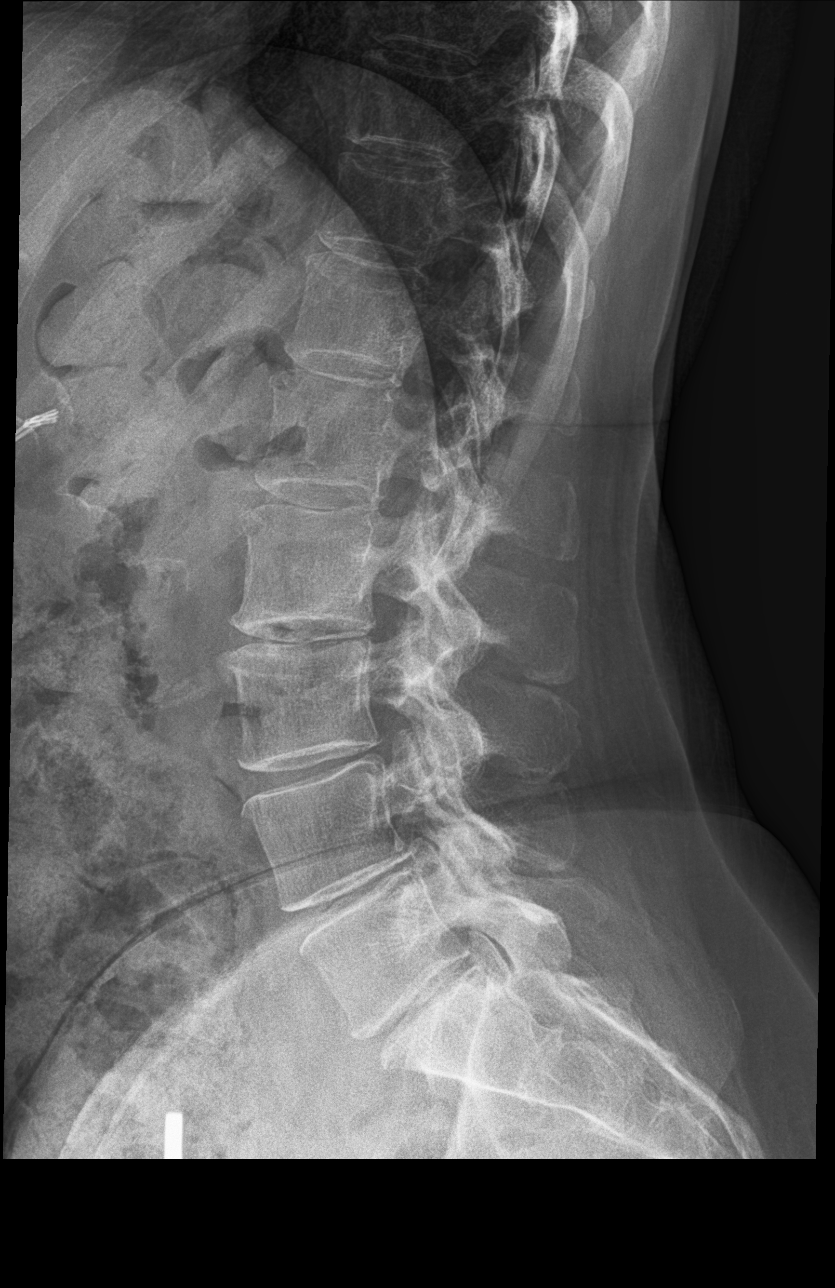

[l-spine spot]
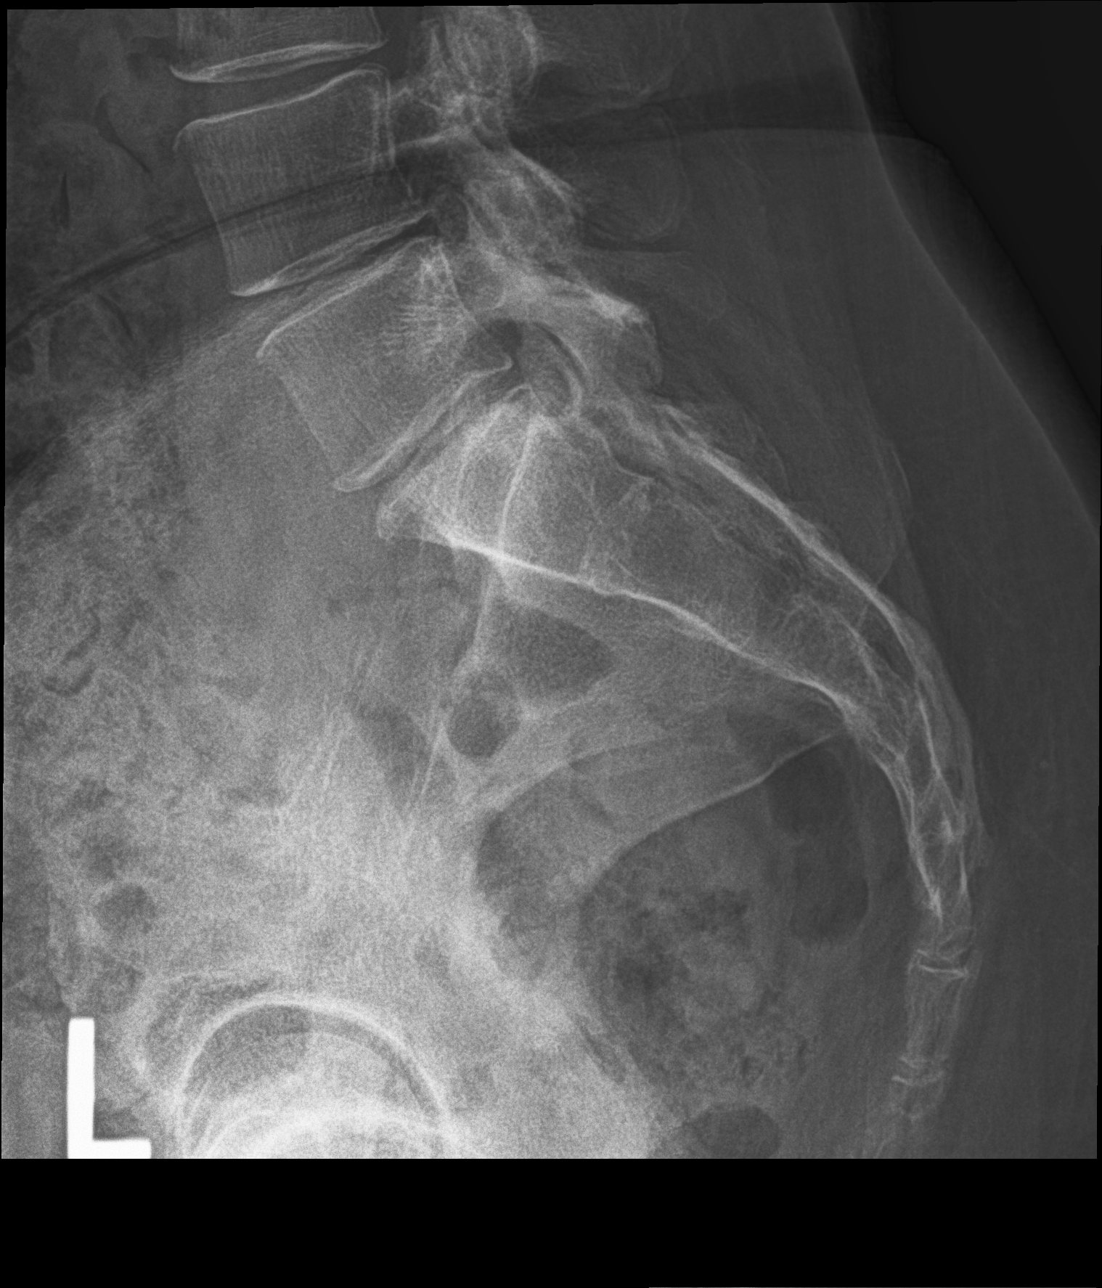

[l-spine ap]
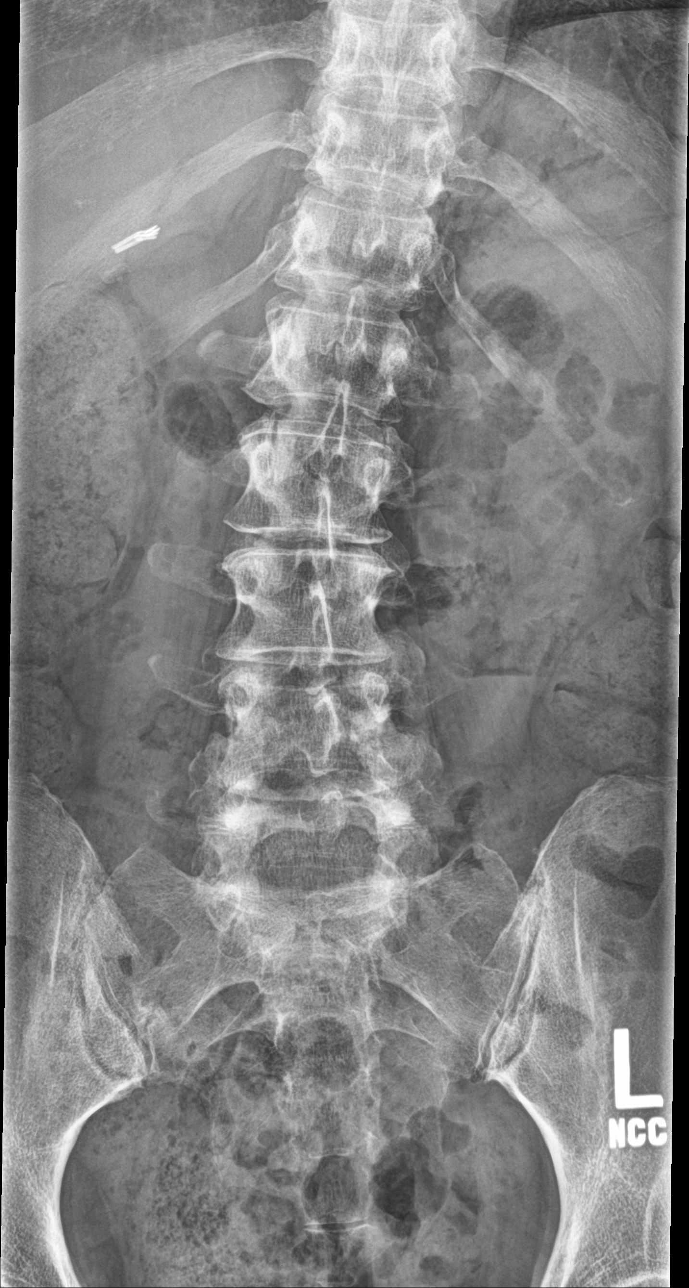

[5 of 5 positions shown; findings below may reference images not displayed]

FINDINGS: Five non-rib-bearing lumbar vertebra.

Bones appear demineralized.

Mild broad-based dextroconvex scoliosis versus positional change.

Scattered disc space narrowing throughout lumbar spine.

Vertebral body heights maintained without fracture or subluxation.

No bone destruction or spondylolysis.

SI joints symmetric.
IMPRESSION: Multilevel degenerative disc disease changes and osseous
demineralization of lumbar spine.

No acute abnormalities.

## 2018-01-24 MED ORDER — DEXAMETHASONE SODIUM PHOSPHATE 100 MG/10ML IJ SOLN
10.0000 mg | Freq: Once | INTRAMUSCULAR | Status: AC
  Administered 2018-01-24: 10 mg via INTRAMUSCULAR

## 2018-01-24 NOTE — Progress Notes (Addendum)
Subjective:    Patient ID: Samantha Mckinney, female    DOB: 1955/12/24, 62 y.o.   MRN: [id number]  HPI 62 y.o. WF presents with lower back pain.  She has been seeing a chiropractor for a year but for the last month she has noticed more pain in her legs, tingling, pain all the way down her leg. Worse with sitting or sleeping. She has some swelling left back/flank and she has pain with pushing on her left hip.   She states she can not sleep well do to pain. She is on ibuprofen 2 a day at night.   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  She has history of lower back surgery in 2000, no hardware.  She has had negative RA work up.   Blood pressure 126/80, pulse 69, temperature 97.7 F (36.5 C), height 5' 6.6" (1.692 m), weight 168 lb 6.4 oz (76.4 kg), SpO2 96 %.  Medications Current Outpatient Medications on File Prior to Visit  Medication Sig  . aspirin EC 81 MG tablet Take 81 mg by mouth daily.  Marland Kitchen b complex vitamins tablet Take 1 tablet by mouth daily.  Marland Kitchen BIOTIN PO Take 5,000 mcg by mouth.  . Cholecalciferol (VITAMIN D) 2000 units tablet Take 2,000 Units by mouth daily.  . folic acid (FOLVITE) 833 MCG tablet Take 400 mcg by mouth daily.  . Magnesium 400 MG TABS Take by mouth daily.  . NON FORMULARY [place]- Herbal Supplements. Zyflamend for inflamation  . NON FORMULARY [place]- Whole Meg fish oil  . OVER THE COUNTER MEDICATION   . Probiotic Product (ALIGN) 4 MG CAPS Take by mouth daily.  Marland Kitchen thiamine (VITAMIN B-1) 100 MG tablet Take 100 mg by mouth daily.  Marland Kitchen ZINC-VITAMIN C PO Take by mouth daily.   No current facility-administered medications on file prior to visit.     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  Review of Systems See HPI    Objective:   Physical Exam  Constitutional: She is oriented to person, place, and time. She appears well-developed and well-nourished.  HENT:  Head: Normocephalic and atraumatic.  Eyes: Pupils are equal, round, and reactive to light. Conjunctivae are normal.  Neck: Normal range of motion. Neck supple.  Cardiovascular: Normal rate and regular rhythm.  Pulmonary/Chest: Effort normal and breath sounds normal.  Abdominal: Soft. Bowel sounds are normal. There is no tenderness.  Musculoskeletal:  Patient is able to ambulate well. Gait is not  Antalgic. Straight leg raising with dorsiflexion negative bilaterally for radicular symptoms. Sensory exam in the legs are normal. Knee reflexes are normal Ankle reflexes are normal Strength is normal and symmetric in arms and legs. There is not SI tenderness to palpation.  There is paraspinal muscle spasm on the left.  There is not midline tenderness.  ROM of spine with  limited in all spheres due to pain. Left hip: positives: tenderness over greater trochanter .Good distal sensations and pulses bilaterally.   Lymphadenopathy:    She has no cervical adenopathy.  Neurological: She is alert and oriented to person, place, and time. She has normal reflexes.  Skin: Skin is warm and dry. No rash noted.       Assessment & Plan:  :  Chronic  left-sided low back pain with left-sided sciatica -     Ambulatory referral to Physical Therapy -     DG Lumbar Spine Complete; Future -     dexamethasone (DECADRON) injection 10 mg  Declines a lot of medications Willing to get injection Start tumeric  Go to the ER if you have any new weakness in your legs, have trouble controlling your urine or bowels, or have worsening pain.

## 2018-01-24 NOTE — Patient Instructions (Addendum)
myalgic encephalomyelitis - look this up  Being dehydrated can hurt your kidneys, cause fatigue, headaches, muscle aches, joint pain, and dry skin/nails so please increase your fluids.   Drink 80-100 oz a day of water, measure it out!  Tumeric with black pepper extract is a great natural antiinflammatory that helps with arthritis and aches and pain. Can get from [employer] or any health food store. Need to take at least 892m twice a day with food.   INFORMATION ABOUT YOUR XRAY  [facility] [facility] to radiology and give them your name. They will have the order and take you back. You do not any paper work, I should get the result back today or tomorrow. This order is good for a year.   Bursitis Bursitis is inflammation and irritation of a bursa, which is one of the small, fluid-filled sacs that cushion and protect the moving parts of your body. These sacs are located between bones and muscles, muscle attachments, or skin areas next to bones. A bursa protects these structures from the wear and tear that results from frequent movement. An inflamed bursa causes pain and swelling. Fluid may build up inside the sac. Bursitis is most common near joints, especially the knees, elbows, hips, and shoulders. What are the causes? Bursitis can be caused by:  Injury from: ? A direct blow, like falling on your knee or elbow. ? Overuse of a joint (repetitive stress).  Infection. This can happen if bacteria gets into a bursa through a cut or scrape near a joint.  Diseases that cause joint inflammation, such as gout and rheumatoid arthritis.  What increases the risk? You may be at risk for bursitis if you:  Have a job or hobby that involves a lot of repetitive stress on your joints.  Have a condition that weakens your body's defense system (immune system), such as diabetes, cancer, or HIV.  Lift and reach overhead often.  Kneel or lean on hard surfaces often.  Run or walk  often.  What are the signs or symptoms? The most common signs and symptoms of bursitis are:  Pain that gets worse when you move the affected body part or put weight on it.  Inflammation.  Stiffness.  Other signs and symptoms may include:  Redness.  Tenderness.  Warmth.  Pain that continues after rest.  Fever and chills. This may occur in bursitis caused by infection.  How is this diagnosed? Bursitis may be diagnosed by:  Medical history and physical exam.  MRI.  A procedure to drain fluid from the bursa with a needle (aspiration). The fluid may be checked for signs of infection or gout.  Blood tests to rule out other causes of inflammation.  How is this treated? Bursitis can usually be treated at home with rest, ice, compression, and elevation (RICE). For mild bursitis, RICE treatment may be all you need. Other treatments may include:  Nonsteroidal anti-inflammatory drugs (NSAIDs) to treat pain and inflammation.  Corticosteroids to fight inflammation. You may have these drugs injected into and around the area of bursitis.  Aspiration of bursitis fluid to relieve pain and improve movement.  Antibiotic medicine to treat an infected bursa.  A splint, brace, or walking aid.  Physical therapy if you continue to have pain or limited movement.  Surgery to remove a damaged or infected bursa. This may be needed if you have a very bad case of bursitis or if other treatments have not worked.  Follow these instructions at  home:  Take medicines only as directed by your health care provider.  If you were prescribed an antibiotic medicine, finish it all even if you start to feel better.  Rest the affected area as directed by your health care provider. ? Keep the area elevated. ? Avoid activities that make pain worse.  Apply ice to the injured area: ? Place ice in a plastic bag. ? Place a towel between your skin and the bag. ? Leave the ice on for 20 minutes, 2-3 times  a day.  Use splints, braces, pads, or walking aids as directed by your health care provider.  Keep all follow-up visits as directed by your health care provider. This is important. How is this prevented?  Wear knee pads if you kneel often.  Wear sturdy running or walking shoes that fit you well.  Take regular breaks from repetitive activity.  Warm up by stretching before doing any strenuous activity.  Maintain a healthy weight or lose weight as recommended by your health care provider. Ask your health care provider if you need help.  Exercise regularly. Start any new physical activity gradually. Contact a health care provider if:  Your bursitis is not responding to treatment or home care.  You have a fever.  You have chills. This information is not intended to replace advice given to you by your health care provider. Make sure you discuss any questions you have with your health care provider. Document Released: 04/01/2000 Document Revised: 09/10/2015 Document Reviewed: 06/24/2013 Elsevier Interactive Patient Education  2018 [employer].

## 2018-02-02 DIAGNOSIS — M79662 Pain in left lower leg: Secondary | ICD-10-CM | POA: Diagnosis not present

## 2018-02-02 DIAGNOSIS — M545 Low back pain: Secondary | ICD-10-CM | POA: Diagnosis not present

## 2018-02-05 DIAGNOSIS — M79662 Pain in left lower leg: Secondary | ICD-10-CM | POA: Diagnosis not present

## 2018-02-05 DIAGNOSIS — M545 Low back pain: Secondary | ICD-10-CM | POA: Diagnosis not present

## 2018-02-12 DIAGNOSIS — M545 Low back pain: Secondary | ICD-10-CM | POA: Diagnosis not present

## 2018-02-12 DIAGNOSIS — M79662 Pain in left lower leg: Secondary | ICD-10-CM | POA: Diagnosis not present

## 2018-02-19 DIAGNOSIS — M545 Low back pain: Secondary | ICD-10-CM | POA: Diagnosis not present

## 2018-02-19 DIAGNOSIS — M79662 Pain in left lower leg: Secondary | ICD-10-CM | POA: Diagnosis not present

## 2018-02-21 ENCOUNTER — Ambulatory Visit: Payer: 59 | Admitting: Physician Assistant

## 2018-02-21 ENCOUNTER — Encounter: Payer: Self-pay | Admitting: Physician Assistant

## 2018-02-21 VITALS — BP 130/76 | HR 67 | Temp 98.0°F | Resp 16 | Ht 66.5 in | Wt 167.2 lb

## 2018-02-21 DIAGNOSIS — Z0000 Encounter for general adult medical examination without abnormal findings: Secondary | ICD-10-CM | POA: Diagnosis not present

## 2018-02-21 DIAGNOSIS — E559 Vitamin D deficiency, unspecified: Secondary | ICD-10-CM

## 2018-02-21 DIAGNOSIS — I10 Essential (primary) hypertension: Secondary | ICD-10-CM | POA: Diagnosis not present

## 2018-02-21 DIAGNOSIS — E7211 Homocystinuria: Secondary | ICD-10-CM

## 2018-02-21 DIAGNOSIS — Z23 Encounter for immunization: Secondary | ICD-10-CM | POA: Diagnosis not present

## 2018-02-21 DIAGNOSIS — K449 Diaphragmatic hernia without obstruction or gangrene: Secondary | ICD-10-CM

## 2018-02-21 DIAGNOSIS — R7989 Other specified abnormal findings of blood chemistry: Secondary | ICD-10-CM

## 2018-02-21 DIAGNOSIS — M25561 Pain in right knee: Secondary | ICD-10-CM

## 2018-02-21 DIAGNOSIS — Z136 Encounter for screening for cardiovascular disorders: Secondary | ICD-10-CM | POA: Diagnosis not present

## 2018-02-21 DIAGNOSIS — D649 Anemia, unspecified: Secondary | ICD-10-CM | POA: Diagnosis not present

## 2018-02-21 DIAGNOSIS — G8929 Other chronic pain: Secondary | ICD-10-CM

## 2018-02-21 DIAGNOSIS — D1803 Hemangioma of intra-abdominal structures: Secondary | ICD-10-CM

## 2018-02-21 DIAGNOSIS — K7581 Nonalcoholic steatohepatitis (NASH): Secondary | ICD-10-CM | POA: Diagnosis not present

## 2018-02-21 DIAGNOSIS — D6851 Activated protein C resistance: Secondary | ICD-10-CM

## 2018-02-21 DIAGNOSIS — N809 Endometriosis, unspecified: Secondary | ICD-10-CM

## 2018-02-21 DIAGNOSIS — Z79899 Other long term (current) drug therapy: Secondary | ICD-10-CM

## 2018-02-21 DIAGNOSIS — I872 Venous insufficiency (chronic) (peripheral): Secondary | ICD-10-CM

## 2018-02-21 DIAGNOSIS — R911 Solitary pulmonary nodule: Secondary | ICD-10-CM

## 2018-02-21 DIAGNOSIS — Z0001 Encounter for general adult medical examination with abnormal findings: Secondary | ICD-10-CM

## 2018-02-21 DIAGNOSIS — M25562 Pain in left knee: Secondary | ICD-10-CM

## 2018-02-21 DIAGNOSIS — Z1389 Encounter for screening for other disorder: Secondary | ICD-10-CM

## 2018-02-21 DIAGNOSIS — E782 Mixed hyperlipidemia: Secondary | ICD-10-CM

## 2018-02-21 NOTE — Progress Notes (Signed)
Complete Physical  Assessment and Plan:  Medication management -     CBC with Differential/Platelet -     Hemoglobin A1c -     Magnesium  Anemia, unspecified type -     TSH -     Vitamin B12 -     Iron,Total/Total Iron Binding Cap  Factor V Leiden (HCC) Continue ASA, monitor   NASH (nonalcoholic steatohepatitis) Check labs, avoid tylenol, alcohol, weight loss advised.  -     COMPLETE METABOLIC PANEL WITH GFR  Liver hemangioma -     COMPLETE METABOLIC PANEL WITH GFR  Elevated homocysteine (HCC) -     ANCA screen with reflex titer  Hiatal hernia Continue PPI/H2 blocker, diet discussed  Endometriosis Continue GYN follow up  Vitamin D deficiency -     VITAMIN D 25 Hydroxy (Vit-D Deficiency, Fractures)  Mixed hyperlipidemia check lipids decrease fatty foods increase activity.  -     TSH -     Lipid panel -     EKG 12-Lead  Encounter for general adult medical examination with abnormal findings 1 year  Lung nodule Will monitor  Need for diphtheria-tetanus-pertussis (Tdap) vaccine -     Tdap vaccine greater than or equal to 7yo IM  Venous insufficiency Likely varicose veins, no warmth, tenderness, hard cords, will refer for evaluation since having pain.  -     Ambulatory referral to Vascular Surgery -     ANCA screen with reflex titer  Chronic pain of both knees Right knee worse than left, no effusion, + midline joint tenderness.  -     Ambulatory referral to Orthopedics  Screening for hematuria or proteinuria -     Urinalysis, Routine w reflex microscopic -     Microalbumin / creatinine urine ratio  Later appointments  Discussed med's effects and SE's. Screening labs and tests as requested with regular follow-up as recommended. Over 40 minutes of exam, counseling, chart review, and complex, high level critical decision making was performed this visit.  Future Appointments  Date Time Provider [place]  03/01/2018  3:40 PM [facility] MM 3 [facility]  [facility]  03/22/2018 12:00 PM Princess Bruins, MD [facility]  03/04/2019  3:00 PM Vicie Mutters, PA-C [id number] None    HPI  62 y.o. 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 She has been going to PT, and they say she has "lumps" on bilateral legs. She wears compression socks.  She has history of NASH, CKD3 and elevated ANA and homocysteine.  Has had a negative connective tissue work up and has seen rheum.   She has skin tag under left arm and near right eye she wants removed.   She has had fluid removed from her knee before in [place] and she is on her feet a lot,she has bilateral knee pain and would like to be referred to ortho.   Her blood pressure has been controlled at home, today their BP is BP: 130/76 She does not workout due to pain. She denies chest pain, shortness of breath, dizziness.    She is on cholesterol medication and denies myalgias. Her cholesterol is  not at goal. The cholesterol last visit was:   Lab Results  Component Value Date   CHOL 229 (H) 02/02/2017   HDL 65 02/02/2017   LDLCALC 138 (H) 02/02/2017   TRIG 132 02/02/2017   CHOLHDL 3.5 02/02/2017    She has been working on diet and exercise for prediabetes, and denies nausea, paresthesia of the feet, polydipsia and polyuria. Last A1C in the office was:  Lab Results  Component Value Date   HGBA1C 5.5 02/02/2017   Patient is on Vitamin D supplement.   Lab Results  Component Value Date   VD25OH 42 02/02/2017     BMI is Body mass index is 26.58 kg/m., she is working on diet and exercise. Wt  Readings from Last 3 Encounters:  02/21/18 167 lb 3.2 oz (75.8 kg)  01/24/18 168 lb 6.4 oz (76.4 kg)  12/25/17 167 lb (75.8 kg)      Lab Results  Component Value Date   GFRNONAA 58 (L) 02/02/2017    Current Medications:  Current Outpatient Medications on File Prior to Visit  Medication Sig Dispense Refill  . aspirin EC 81 MG tablet Take 81 mg by mouth daily.    Marland Kitchen b complex vitamins tablet Take 1 tablet by mouth daily.    Marland Kitchen BIOTIN PO Take 5,000 mcg by mouth.    . Cholecalciferol (VITAMIN D) 2000 units tablet Take 2,000 Units by mouth daily.    . folic acid (FOLVITE) 921 MCG tablet Take 400 mcg by mouth daily.    . Magnesium 400 MG TABS Take by mouth daily.    . NON FORMULARY [place]- Herbal Supplements. Zyflamend for inflamation    . NON FORMULARY [place]- Whole Meg fish oil    . OVER THE COUNTER MEDICATION     . Probiotic Product (ALIGN) 4 MG CAPS Take by mouth daily.    Marland Kitchen thiamine (VITAMIN B-1) 100 MG tablet Take 100 mg by mouth daily.    Marland Kitchen ZINC-VITAMIN C PO Take by mouth daily.     No current facility-administered medications on file prior to visit.    Allergies:  Allergies  Allergen Reactions  . 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   Health Maintenance:   Immunization History  Administered Date(s) Administered  . Tdap 02/21/2018    Tetanus: 12/2015 Pneumovax: Prevnar 13:  Flu vaccine: declines Zostavax:  LMP: TAH Pap: TAH MGM:10/17/2016 has appointment on the 14th  DEXA: N/A Colonoscopy: 12/25/2017 EGD: N/A  Patient Care Team: Unk Pinto, MD as PCP - General (Internal Medicine)  Surgical History:  She has a past surgical history that includes Cholecystectomy; Abdominal  hysterectomy (1998); Laparoscopic endometriosis fulguration; Hemorrhoid surgery (1996); and Back surgery (2000). Family History:  Herfamily history includes Arthritis in her mother; Atrial fibrillation in her brother; Bowel Disease in her mother; Cancer (age of onset: 44) in her maternal grandmother; Cirrhosis in her mother; Colon cancer in her maternal grandmother; Depression in her mother and sister; Diabetes in her brother, father, and mother; Gout in her mother; Heart disease in her father; Hyperlipidemia in her son; Hypertension in her brother; Valvular heart disease in her brother and sister. Social History:  She reports that she has never smoked. She has never used smokeless tobacco. She reports that she drinks alcohol. She reports  that she does not use drugs.  Review of Systems: Review of Systems  Constitutional: Positive for malaise/fatigue. Negative for chills, diaphoresis, fever and weight loss.  HENT: Negative.   Eyes: Negative.   Respiratory: Negative.   Cardiovascular: Positive for leg swelling. Negative for chest pain, palpitations, orthopnea, claudication and PND.  Gastrointestinal: Positive for heartburn. Negative for abdominal pain, blood in stool, constipation, diarrhea, melena, nausea and vomiting.  Genitourinary: Negative.   Musculoskeletal: Positive for myalgias. Negative for back pain, falls, joint pain and neck pain.  Skin: Negative for itching and rash.  Neurological: Negative.  Negative for weakness.  Endo/Heme/Allergies: Negative.   Psychiatric/Behavioral: Negative for hallucinations, memory loss, substance abuse and suicidal ideas. The patient has insomnia. The patient is not nervous/anxious.     Physical Exam: Estimated body mass index is 26.58 kg/m as calculated from the following:   Height as of this encounter: 5' 6.5" (1.689 m).   Weight as of this encounter: 167 lb 3.2 oz (75.8 kg). BP 130/76   Pulse 67   Temp 98 F (36.7 C)   Resp 16   Ht 5' 6.5"  (1.689 m)   Wt 167 lb 3.2 oz (75.8 kg)   SpO2 97%   BMI 26.58 kg/m  General Appearance: Well nourished, in no apparent distress.  Eyes: PERRLA, EOMs, conjunctiva no swelling or erythema, normal fundi and vessels.  Sinuses: No Frontal/maxillary tenderness  ENT/Mouth: Ext aud canals clear, normal light reflex with TMs without erythema, bulging. Good dentition. No erythema, swelling, or exudate on post pharynx. Tonsils not swollen or erythematous. Hearing normal.  Neck: Supple, thyroid normal. No bruits  Respiratory: Respiratory effort normal, BS equal bilaterally without rales, rhonchi, wheezing or stridor.  Cardio: RRR without murmurs, rubs or gallops. Brisk peripheral pulses without edema.  Chest: symmetric, with normal excursions and percussion.  Breasts: defer Abdomen: Soft, left upper quadrant tenderness, no guarding, rebound, hernias, masses, or organomegaly.  Lymphatics: Non tender without lymphadenopathy.  Genitourinary: defer Musculoskeletal: Full ROM all peripheral extremities,5/5 strength, and normal gait. Right medial joint line tenderness of knee, no effusion.  Skin: + varicose veins bilateral legs,no erythema, swelling, warmth. Patient in compression socks today. Warm, dry without rashes, lesions, ecchymosis. Neuro: Cranial nerves intact, reflexes equal bilaterally. Normal muscle tone, no cerebellar symptoms. Sensation intact.  Psych: Awake and oriented X 3, normal affect, Insight and Judgment appropriate.   EKG: WNL, no ST changes AORTA SCAN: defer  Vicie Mutters 3:40 PM [facility]

## 2018-02-21 NOTE — Patient Instructions (Signed)

## 2018-02-22 LAB — IRON, TOTAL/TOTAL IRON BINDING CAP
%SAT: 23 % (ref 16–45)
Iron: 80 ug/dL (ref 45–160)
TIBC: 354 mcg/dL (calc) (ref 250–450)

## 2018-02-22 LAB — HEMOGLOBIN A1C
HEMOGLOBIN A1C: 5.6 %{Hb} (ref <5.7)
Mean Plasma Glucose: 114 (calc)
eAG (mmol/L): 6.3 (calc)

## 2018-02-22 LAB — URINALYSIS, ROUTINE W REFLEX MICROSCOPIC
BACTERIA UA: NONE SEEN /HPF
Bilirubin Urine: NEGATIVE
GLUCOSE, UA: NEGATIVE
HGB URINE DIPSTICK: NEGATIVE
Hyaline Cast: NONE SEEN /LPF
Ketones, ur: NEGATIVE
NITRITE: NEGATIVE
PH: 6.5 (ref 5.0–8.0)
PROTEIN: NEGATIVE
RBC / HPF: NONE SEEN /HPF (ref 0–2)
SPECIFIC GRAVITY, URINE: 1.007 (ref 1.001–1.03)
SQUAMOUS EPITHELIAL / LPF: NONE SEEN /HPF (ref <=5)

## 2018-02-22 LAB — COMPLETE METABOLIC PANEL WITH GFR
AG Ratio: 2.2 (calc) (ref 1.0–2.5)
ALT: 20 U/L (ref 6–29)
AST: 23 U/L (ref 10–35)
Albumin: 4.7 g/dL (ref 3.6–5.1)
Alkaline phosphatase (APISO): 52 U/L (ref 33–130)
BILIRUBIN TOTAL: 0.6 mg/dL (ref 0.2–1.2)
BUN: 20 mg/dL (ref 7–25)
CHLORIDE: 103 mmol/L (ref 98–110)
CO2: 29 mmol/L (ref 20–32)
Calcium: 9.9 mg/dL (ref 8.6–10.4)
Creat: 0.86 mg/dL (ref 0.50–0.99)
GFR, Est African American: 85 mL/min/{1.73_m2} (ref >=60)
GFR, Est Non African American: 73 mL/min/{1.73_m2} (ref >=60)
GLUCOSE: 86 mg/dL (ref 65–99)
Globulin: 2.1 g/dL (calc) (ref 1.9–3.7)
Potassium: 4.5 mmol/L (ref 3.5–5.3)
Sodium: 139 mmol/L (ref 135–146)
Total Protein: 6.8 g/dL (ref 6.1–8.1)

## 2018-02-22 LAB — ANCA SCREEN W REFLEX TITER: ANCA SCREEN: NEGATIVE

## 2018-02-22 LAB — CBC WITH DIFFERENTIAL/PLATELET
BASOS ABS: 50 {cells}/uL (ref 0–200)
Basophils Relative: 0.9 %
Eosinophils Absolute: 140 cells/uL (ref 15–500)
Eosinophils Relative: 2.5 %
HEMATOCRIT: 39.4 % (ref 35.0–45.0)
HEMOGLOBIN: 13.5 g/dL (ref 11.7–15.5)
LYMPHS ABS: 2302 {cells}/uL (ref 850–3900)
MCH: 30.9 pg (ref 27.0–33.0)
MCHC: 34.3 g/dL (ref 32.0–36.0)
MCV: 90.2 fL (ref 80.0–100.0)
MPV: 10.8 fL (ref 7.5–12.5)
Monocytes Relative: 7.3 %
NEUTROS ABS: 2699 {cells}/uL (ref 1500–7800)
NEUTROS PCT: 48.2 %
Platelets: 267 10*3/uL (ref 140–400)
RBC: 4.37 10*6/uL (ref 3.80–5.10)
RDW: 11.7 % (ref 11.0–15.0)
Total Lymphocyte: 41.1 %
WBC: 5.6 10*3/uL (ref 3.8–10.8)
WBCMIX: 409 {cells}/uL (ref 200–950)

## 2018-02-22 LAB — MICROALBUMIN / CREATININE URINE RATIO: CREATININE, URINE: 26 mg/dL (ref 20–275)

## 2018-02-22 LAB — LIPID PANEL
Cholesterol: 234 mg/dL — ABNORMAL HIGH (ref <200)
HDL: 64 mg/dL (ref >50)
LDL CHOLESTEROL (CALC): 142 mg/dL — AB
NON-HDL CHOLESTEROL (CALC): 170 mg/dL — AB (ref <130)
Total CHOL/HDL Ratio: 3.7 (calc) (ref <5.0)
Triglycerides: 150 mg/dL — ABNORMAL HIGH (ref <150)

## 2018-02-22 LAB — VITAMIN B12: Vitamin B-12: 491 pg/mL (ref 200–1100)

## 2018-02-22 LAB — TSH: TSH: 2.37 m[IU]/L (ref 0.40–4.50)

## 2018-02-22 LAB — MAGNESIUM: MAGNESIUM: 2.1 mg/dL (ref 1.5–2.5)

## 2018-02-22 LAB — VITAMIN D 25 HYDROXY (VIT D DEFICIENCY, FRACTURES): VIT D 25 HYDROXY: 38 ng/mL (ref 30–100)

## 2018-02-23 ENCOUNTER — Other Ambulatory Visit: Payer: Self-pay

## 2018-02-23 DIAGNOSIS — I872 Venous insufficiency (chronic) (peripheral): Secondary | ICD-10-CM

## 2018-02-26 DIAGNOSIS — M79662 Pain in left lower leg: Secondary | ICD-10-CM | POA: Diagnosis not present

## 2018-02-26 DIAGNOSIS — M545 Low back pain: Secondary | ICD-10-CM | POA: Diagnosis not present

## 2018-02-26 MED ORDER — CYCLOBENZAPRINE HCL 10 MG PO TABS: 10.0000 mg | ORAL_TABLET | Freq: Every evening | ORAL | 0 refills | Status: DC | PRN

## 2018-02-27 ENCOUNTER — Encounter (INDEPENDENT_AMBULATORY_CARE_PROVIDER_SITE_OTHER): Payer: Self-pay | Admitting: Family Medicine

## 2018-02-27 ENCOUNTER — Ambulatory Visit (INDEPENDENT_AMBULATORY_CARE_PROVIDER_SITE_OTHER): Payer: 59 | Admitting: Family Medicine

## 2018-02-27 DIAGNOSIS — M25561 Pain in right knee: Secondary | ICD-10-CM | POA: Diagnosis not present

## 2018-02-27 DIAGNOSIS — G8929 Other chronic pain: Secondary | ICD-10-CM

## 2018-02-27 DIAGNOSIS — M25562 Pain in left knee: Secondary | ICD-10-CM | POA: Diagnosis not present

## 2018-02-27 MED ORDER — DICLOFENAC SODIUM 1 % TD GEL: 4.0000 g | Freq: Four times a day (QID) | TRANSDERMAL | 6 refills | Status: DC | PRN

## 2018-02-27 NOTE — Progress Notes (Signed)
Office Visit Note   Patient: Samantha Mckinney           Date of Birth: Jul 19, 1955           MRN: [id number] Visit Date: 02/27/2018 Requested by: Vicie Mutters, PA-C [address] [place], [place] [address] PCP: Unk Pinto, MD  Subjective: Chief Complaint  Patient presents with  . Left Knee - Pain  . Right Knee - Pain    History of having knees aspirated - used to be a runner.    HPI: She is a 62 year old with bilateral knee pain.  Long-standing problems with her knees.  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  One time while living in [place], she had to have both of her knees aspirated due to effusions.  She recently moved back to this area, and has had some recurrent swelling in her knees.  She wonders whether they might need to be aspirated.  She is not taking any medication specifically for her pain but is on turmeric and glucosamine chronically, and she minimizes her sugar intake.  She has a history of autoimmune disease of some sort with positive ANA.  She works at [employer] and is on her feet all day long.              ROS: She has a history of Karlene Lineman and chronic kidney disease along with vitamin D deficiency.  All other systems were negative.  Objective: Vital Signs: There were no vitals taken for this visit.  Physical Exam:  Knees: Trace effusion on the right, none on the left.  No warmth or erythema in either knee.  1+ patellofemoral crepitus on the right, none on the left.  Ligaments are stable, full range of motion.  Both knees are tender on the medial joint line, no palpable click with McMurray's.  Imaging: None today.  Patient states that she had them at an outside rheumatology clinic and was told that she had some arthritis in her knees.  Assessment & Plan: 1.  Chronic bilateral knee pain, probably due to osteoarthritis -Discussed options with her and elected to try Voltaren gel  topically.  If symptoms worsen, we could try cortisone injection or Visco supplementation.  Follow-up as needed.   Follow-Up Instructions: No follow-ups on file.      Procedures: No procedures performed  No notes on file    PMFS History: Patient Active Problem List   Diagnosis Date Noted  . Lung nodule 02/02/2017  . Factor V Leiden ([place]) 02/02/2017  . Mixed hyperlipidemia 02/02/2017  . 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 Vitamin D deficiency 02/02/2017  . Endometriosis 02/02/2017  . Liver hemangioma 02/02/2017  . Hiatal hernia 02/02/2017  . CKD (chronic kidney disease) 02/02/2017  . NASH (nonalcoholic steatohepatitis) 02/02/2017   Past Medical History:  Diagnosis Date  . Allergy   . Cataract   . Colon polyps   . Elevated cholesterol   . Endometriosis 02/02/2017  . Factor V Leiden ([place]) 02/02/2017  . GERD (gastroesophageal reflux disease)   . IBS (irritable bowel syndrome)   . Liver hemangioma 02/02/2017  . NASH (nonalcoholic steatohepatitis) 02/02/2017    Family History  Problem Relation Age of Onset  . Cirrhosis Mother   . Diabetes Mother   . Arthritis Mother   . Gout Mother   . Depression Mother   . Bowel Disease Mother   . Heart disease Father   .  Diabetes Father   . Valvular heart disease Sister   . Depression Sister   . Diabetes Brother   . Hyperlipidemia Son   . Cancer Maternal Grandmother 83       colon cancer  . Colon cancer Maternal Grandmother   . Atrial fibrillation Brother   . Valvular heart disease Brother   . Hypertension Brother     Past Surgical History:  Procedure Laterality Date  . ABDOMINAL HYSTERECTOMY  1998   TOTAL, on estrogen  until 2012  . BACK SURGERY  2000   disc removed L4L5  . CHOLECYSTECTOMY    . [place]  . LAPAROSCOPIC ENDOMETRIOSIS FULGURATION     x 6, 1980-1991   Social History   Occupational History  . Not on file  Tobacco Use  . Smoking status: Never Smoker  . Smokeless tobacco:  Never Used  Substance and Sexual Activity  . Alcohol use: Yes    Comment: OCC   . Drug use: Never  . Sexual activity: Not Currently    Partners: Male    Comment: 1st intercourse- 37, partners- 76, married- 109 yrs

## 2018-03-01 ENCOUNTER — Ambulatory Visit
Admission: RE | Admit: 2018-03-01 | Discharge: 2018-03-01 | Disposition: A | Discharge status: Home / Self Care | Payer: 59 | Source: Ambulatory Visit | Attending: Physician Assistant | Admitting: Physician Assistant

## 2018-03-01 DIAGNOSIS — Z1231 Encounter for screening mammogram for malignant neoplasm of breast: Secondary | ICD-10-CM

## 2018-03-01 IMAGING — MG DIGITAL SCREENING BILATERAL MAMMOGRAM WITH TOMO AND CAD
8 series · 8 of 24 positions shown · non-contrast
Comparison: Previous exam(s).

CLINICAL DATA: Screening.

EXAM:
DIGITAL SCREENING BILATERAL MAMMOGRAM WITH TOMO AND CAD

[R CC synth-2D]
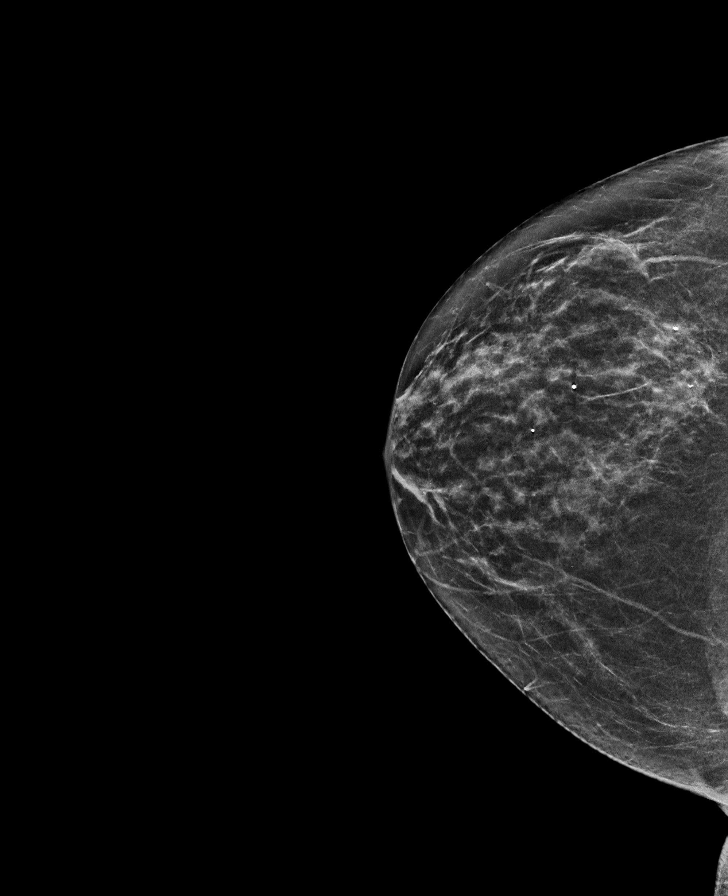

[R MLO synth-2D]
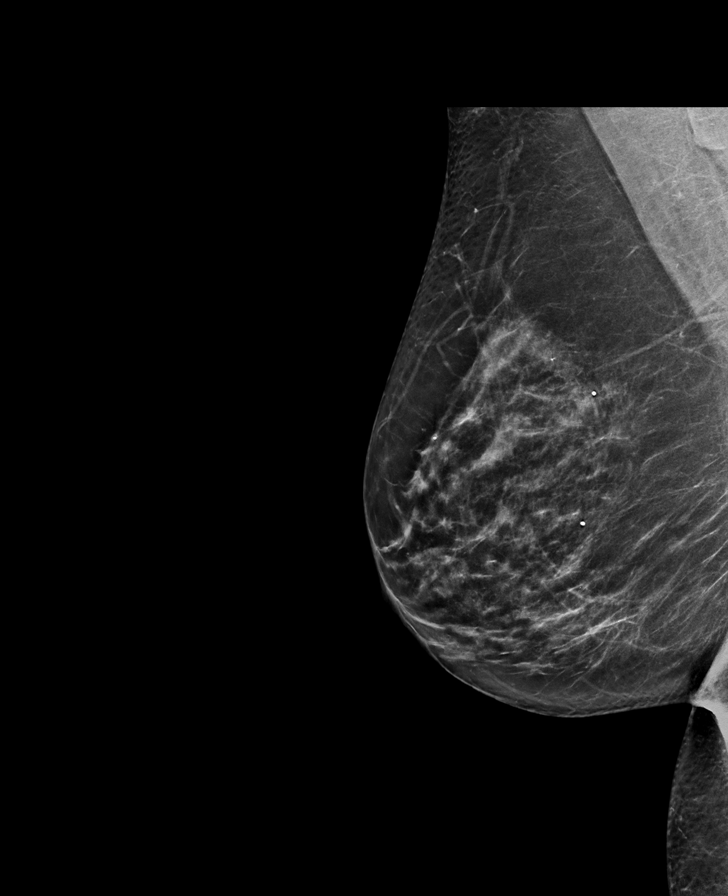

[L CC synth-2D]
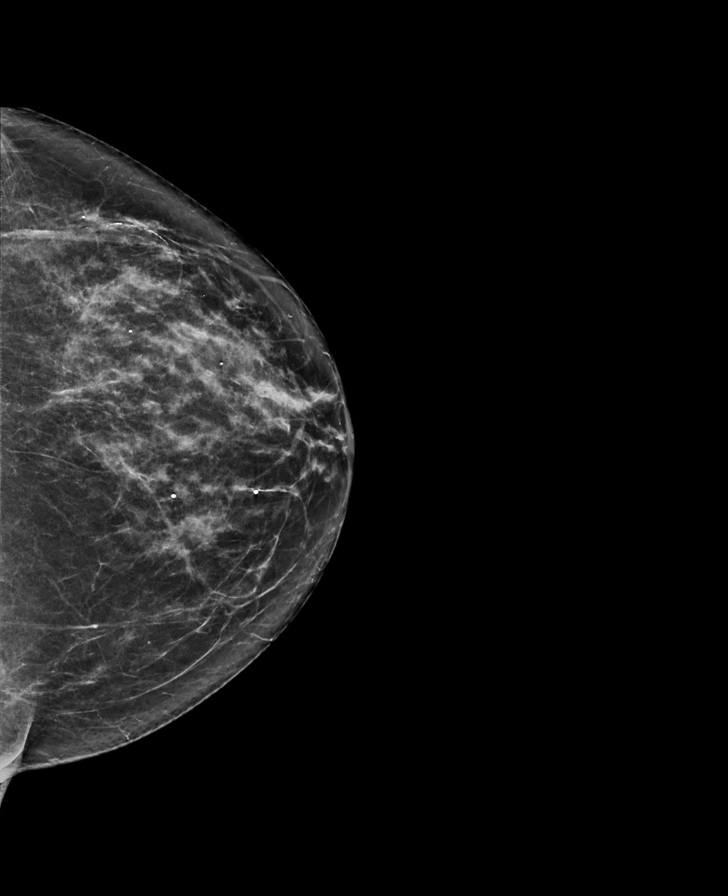

[L MLO synth-2D]
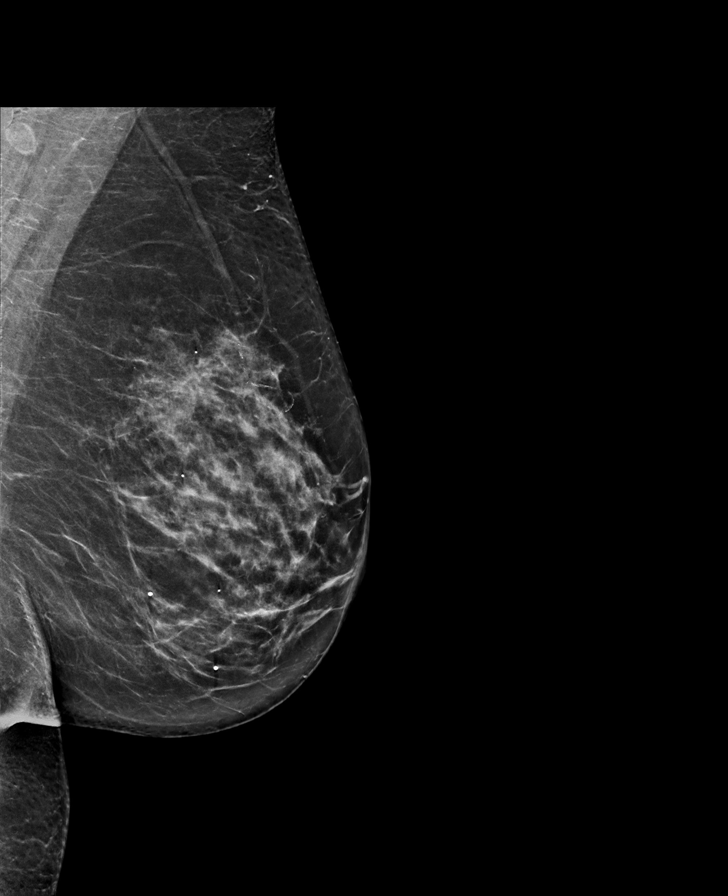

[R MLO tomo · tomo slice 37/73.0]
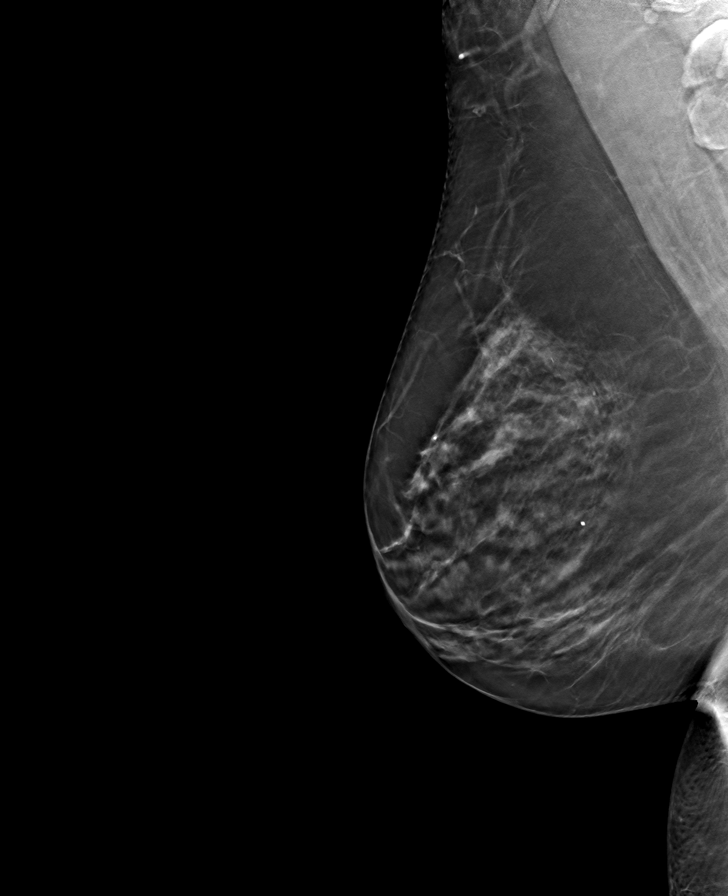

[L CC tomo · tomo slice 35/70.0]
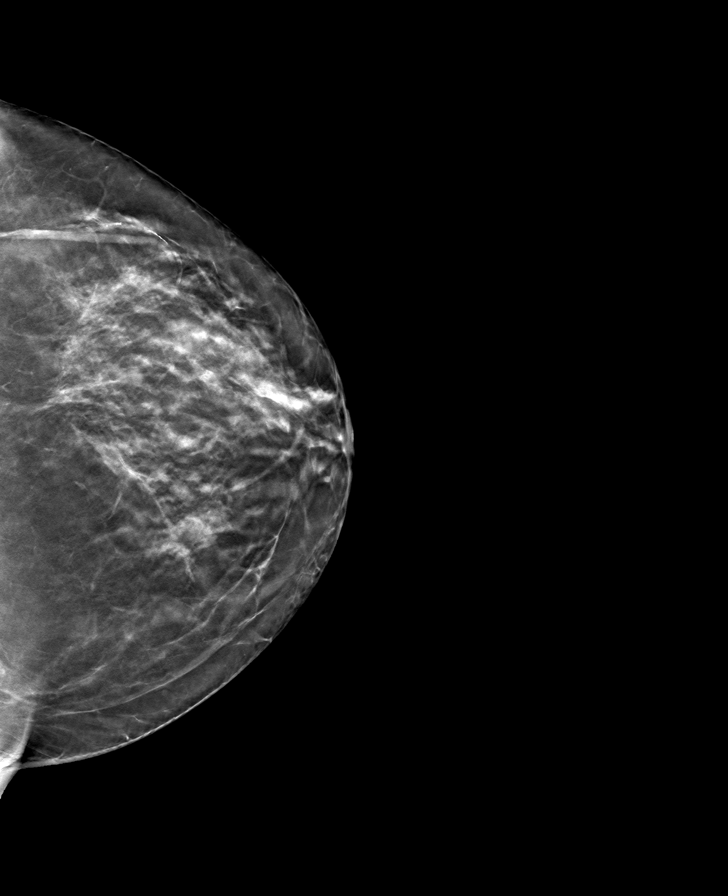

[R CC tomo · tomo slice 31/61.0]
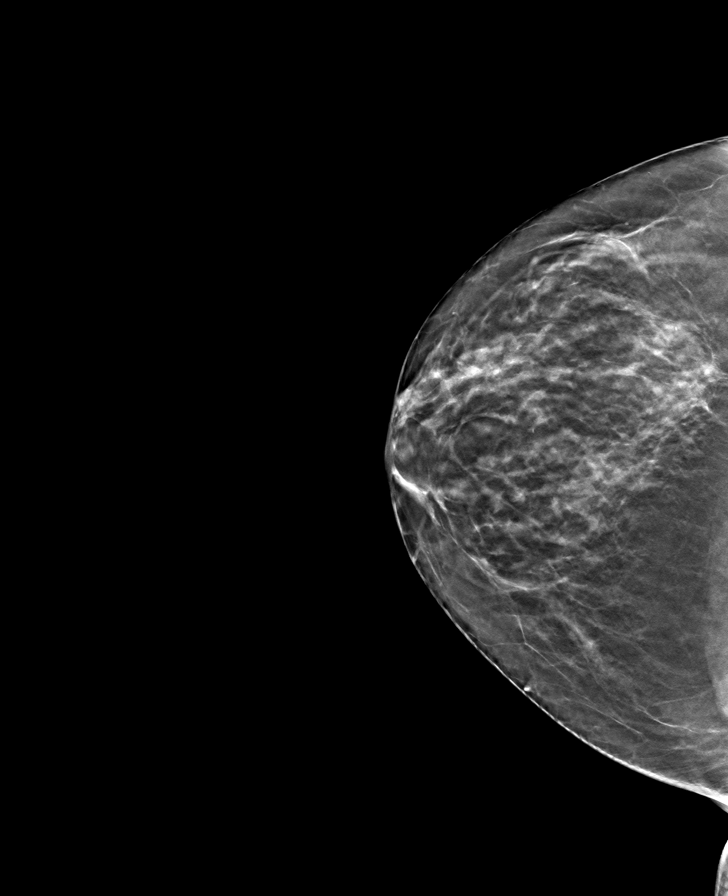

[L MLO tomo · tomo slice 40/79.0]
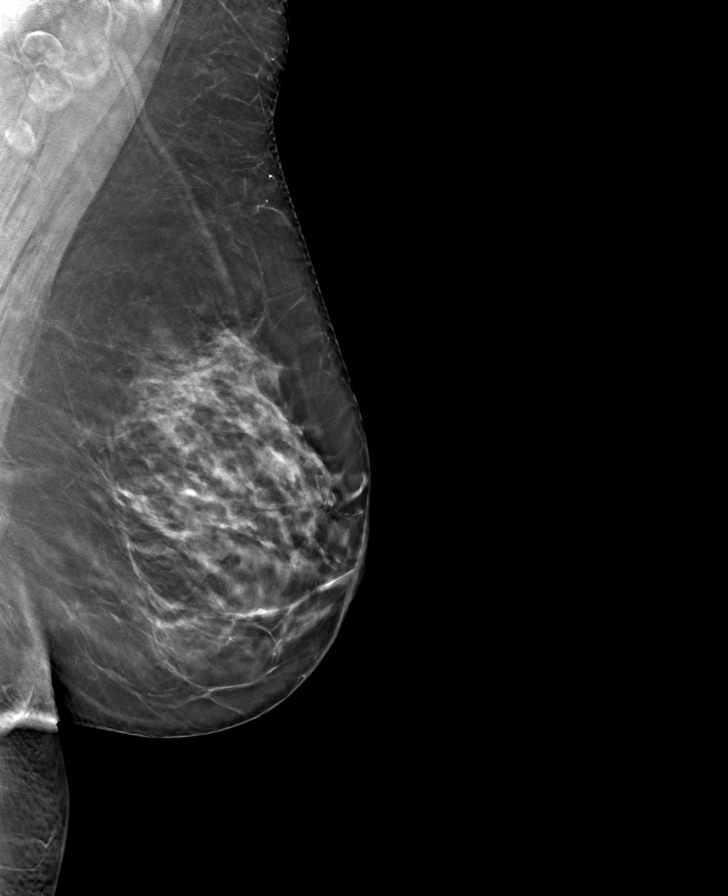

[8 of 24 positions shown; findings below may reference images not displayed]

ACR Breast Density Category c: The breast tissue is heterogeneously
dense, which may obscure small masses.
FINDINGS: There are no findings suspicious for malignancy. Images were
processed with CAD.
IMPRESSION: No mammographic evidence of malignancy. A result letter of this
screening mammogram will be mailed directly to the patient.

RECOMMENDATION:
Screening mammogram in one year. (Code:[5V])

BI-RADS CATEGORY  1: Negative.

## 2018-03-06 DIAGNOSIS — M545 Low back pain: Secondary | ICD-10-CM | POA: Diagnosis not present

## 2018-03-06 DIAGNOSIS — M79662 Pain in left lower leg: Secondary | ICD-10-CM | POA: Diagnosis not present

## 2018-03-13 IMAGING — US US PELVIS COMPLETE TRANSABD/TRANSVAG
1 series · 14 of 25 positions shown · non-contrast
Comparison: None

CLINICAL DATA: Right lower quadrant pain. Question rule out hernia.
History endometriosis



[Series 1: us pelvis complete transabd/transvag · 0.30mm/px · 14 of 39 slices shown]
[im 1/39]
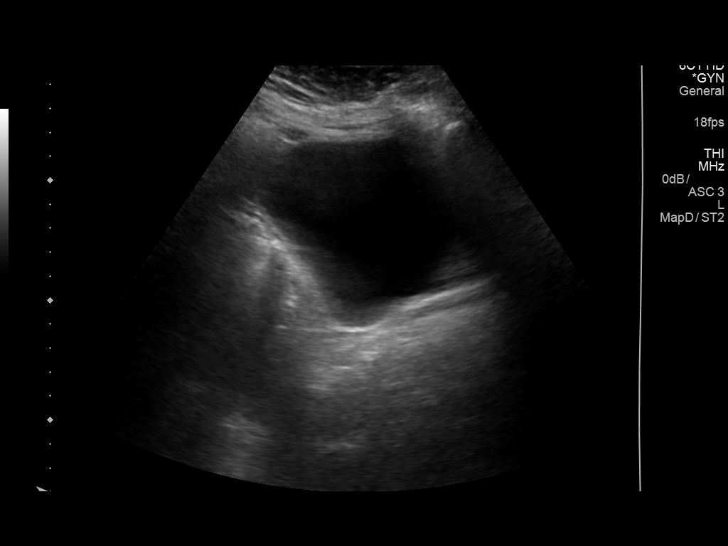
[im 4/39]
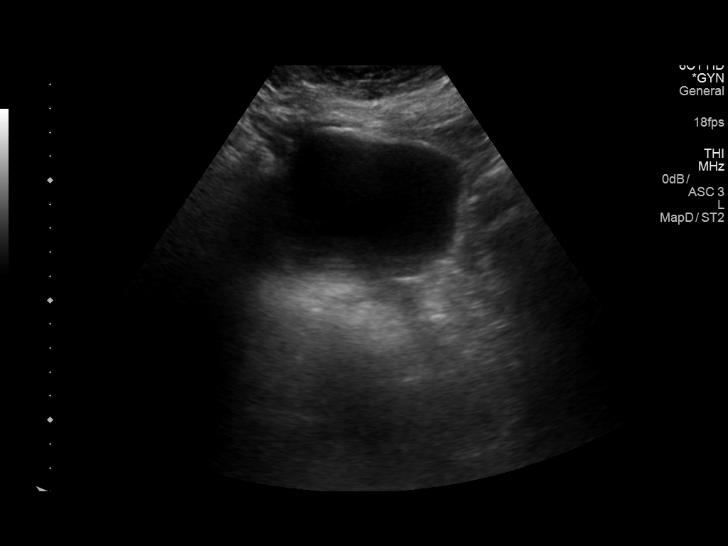
[im 7/39]
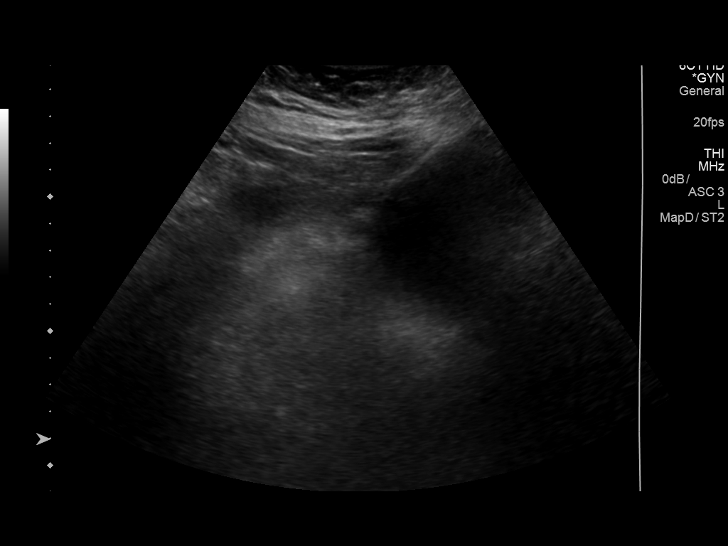
[im 10/39]
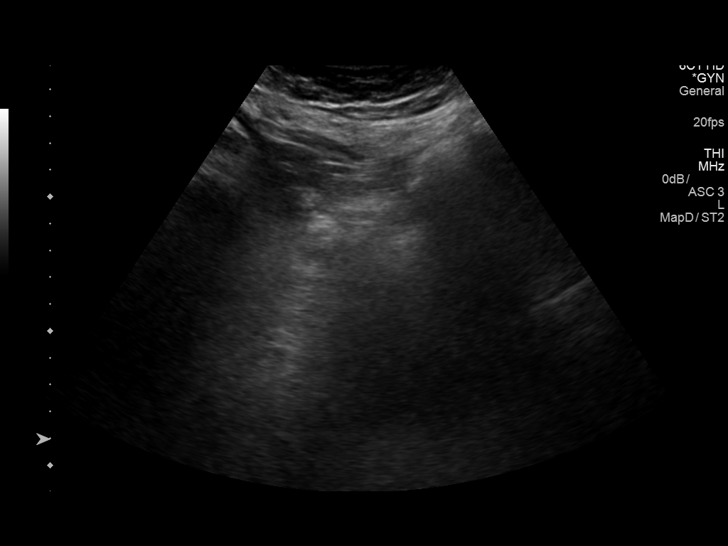
[im 13/39]
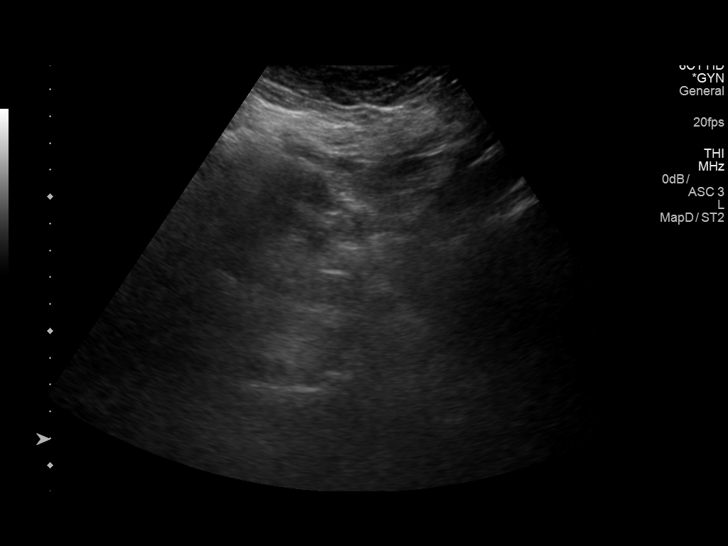
[im 15/39]
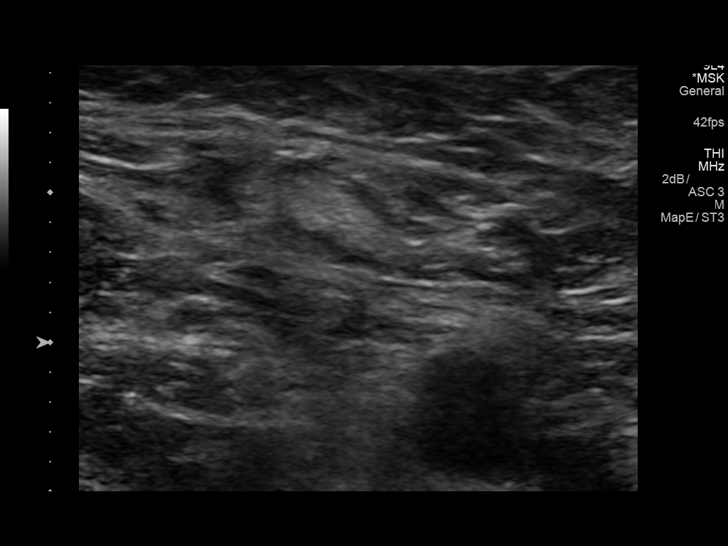
[im 18/39]
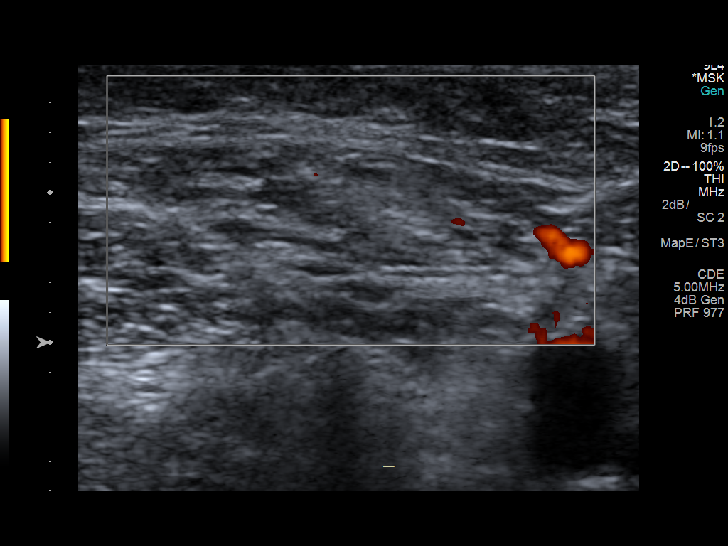
[im 21/39]
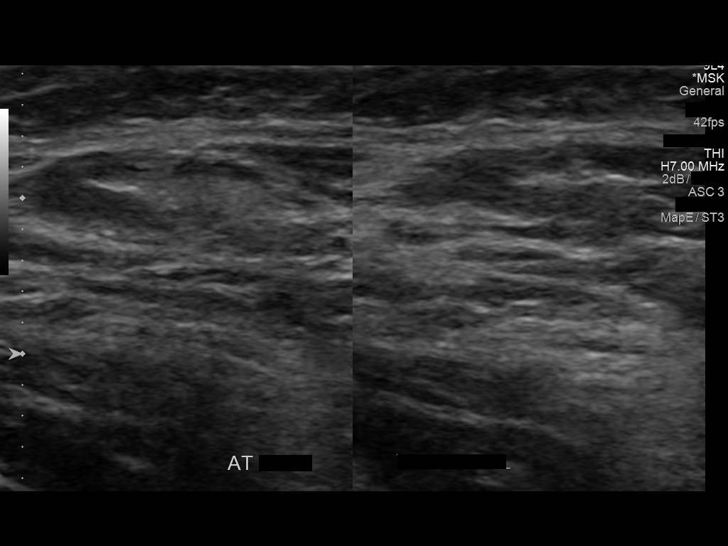
[im 24/39]
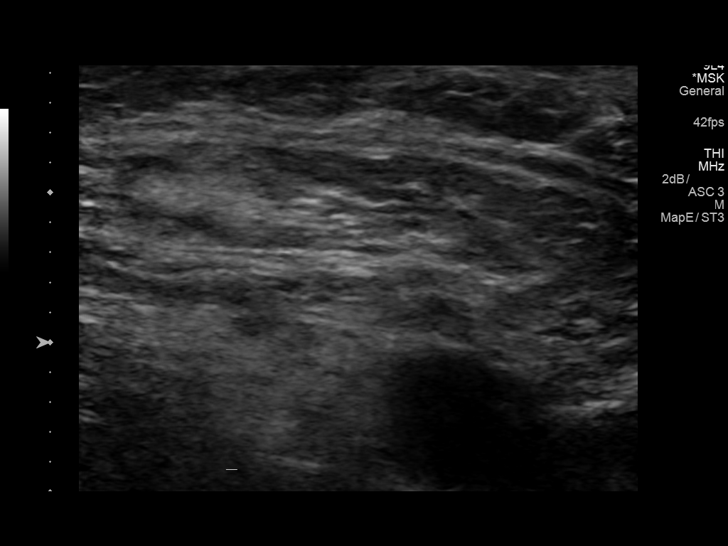
[im 26/39]
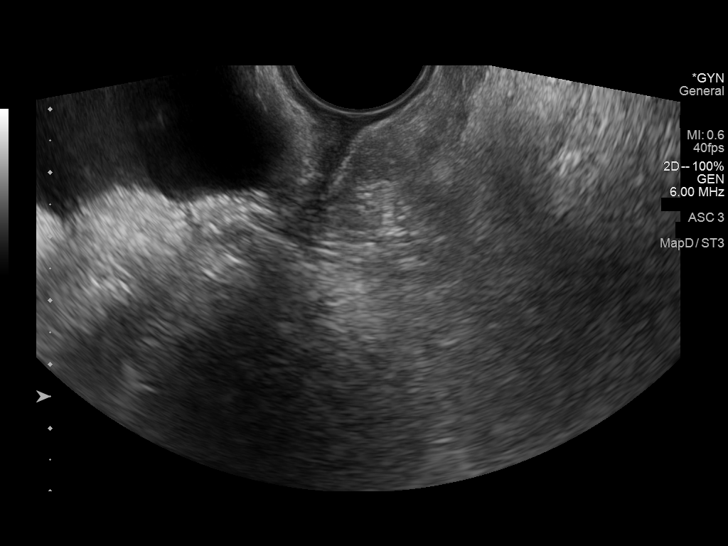
[im 29/39]
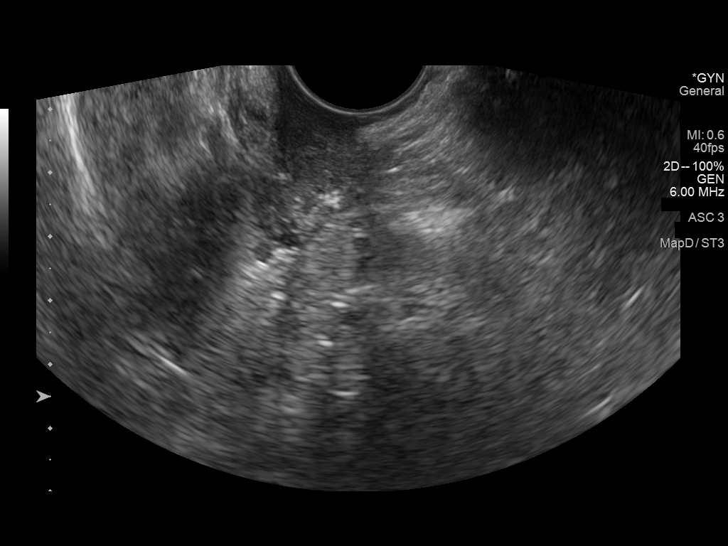
[im 32/39]
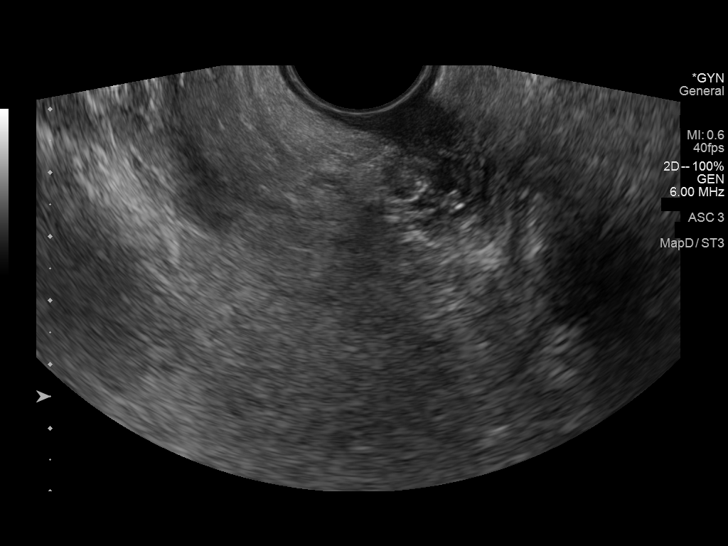
[im 35/39]
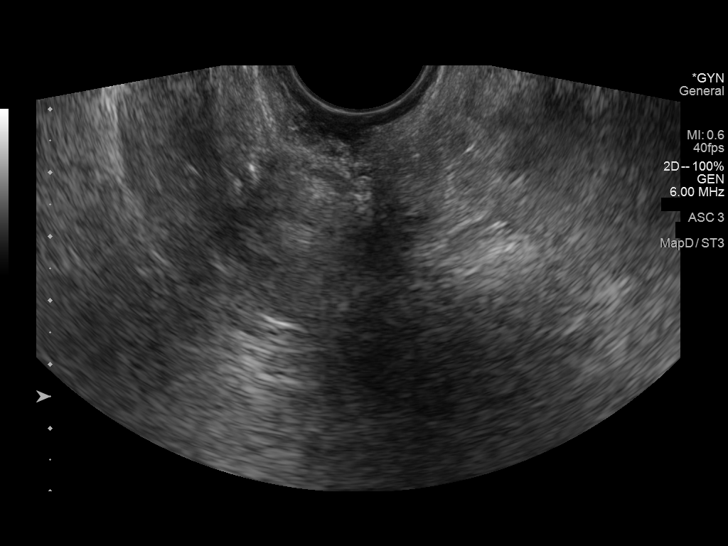
[im 39/39]
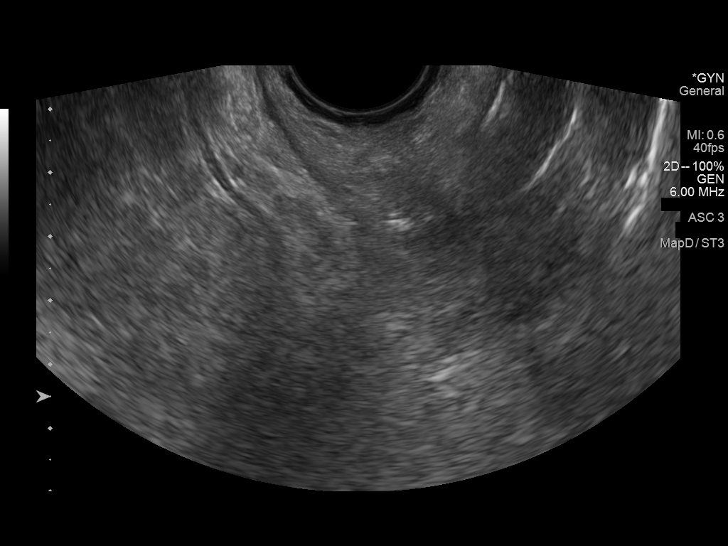

[14 of 25 positions shown; findings below may reference images not displayed]

FINDINGS: Uterus

Surgically absent

Right ovary

Measurements: . Surgically removed right ovary

Left ovary

Measurements: . Surgically removed left ovary

Other findings

Scanning in the right groin reveals no cystic or solid mass. No
hernia noted.
IMPRESSION: Total hysterectomy.  No pelvic mass

Negative for right inguinal hernia.

## 2018-03-19 DIAGNOSIS — M79662 Pain in left lower leg: Secondary | ICD-10-CM | POA: Diagnosis not present

## 2018-03-19 DIAGNOSIS — M545 Low back pain: Secondary | ICD-10-CM | POA: Diagnosis not present

## 2018-03-19 NOTE — Progress Notes (Signed)
Assessment and Plan:  Samantha Mckinney was seen today for procedure.  Diagnoses and all orders for this visit:  Hypertension, unspecified type At goal Monitor blood pressure at home; call if consistently over 130/80 Continue DASH diet.   Reminder to go to the ER if any CP, SOB, nausea, dizziness, severe HA, changes vision/speech, left arm numbness and tingling and jaw pain.  Inflamed skin tag After discussion of risks and benefits, with oral consent, removed skin tags x 2 from bilateral axilla in office today without complication 1. Instructed to keep the wounds dry and covered for 24-48h and clean thereafter. 2. Warning signs of infection were reviewed.   3. Recommended that the patient use OTC analgesics as needed for pain.  4. Return as needed.  Further disposition pending results of labs. Discussed med's effects and SE's.   Over 30 minutes of exam, counseling, chart review, and critical decision making was performed.   Future Appointments  Date Time Provider [place]  04/17/2018  1:00 PM [facility] - [facility]  04/17/2018  2:00 PM Early, Arvilla Meres, MD [facility]  05/30/2018  3:00 PM Princess Bruins, MD [facility]  09/13/2018  2:30 PM Vicie Mutters, PA-C [facility] None  03/04/2019  3:00 PM Vicie Mutters, PA-C [facility] None    ------------------------------------------------------------------------------------------------------------------   HPI BP 124/80   Pulse 93   Temp (!) 97.2 F (36.2 C)   Ht 5' 6.5" (1.689 m)   Wt 169 lb 3.2 oz (76.7 kg)   SpO2 99%   BMI 26.90 kg/m   62 y.o.female presents for follow up on BP and recurrently inflamed skin tags x 2 removal.   Skin Tag Removal Procedure Note  Pre-operative Diagnosis: Classic skin tags (acrochordon)  Post-operative Diagnosis: Classic skin tags (acrochordon)  Locations:bilateral axilla  Indications:  Patient complains of skin tags that are recurrently irritated.   Anesthesia: Lidocaine 1%  without epinephrine  Procedure Details  The risks (including bleeding and infection) and benefits of the procedure and Verbal informed consent obtained. Using sterile 10 blade, multiple skin tags were snipped off at their bases after cleansing with Alcohol.  Bleeding was controlled with electrocautery. Topical bacitracin and adhesive dressing applied.   Findings: Pathognomonic benign lesions  not sent for pathological exam.  Condition: Stable  Complications: none.     Past Medical History:  Diagnosis Date  . Allergy   . Cataract   . Colon polyps   . Elevated cholesterol   . Endometriosis 02/02/2017  . Factor V Leiden ([place]) 02/02/2017  . GERD (gastroesophageal reflux disease)   . IBS (irritable bowel syndrome)   . Liver hemangioma 02/02/2017  . NASH (nonalcoholic steatohepatitis) 02/02/2017     Allergies  Allergen Reactions  . Morphine And Related Swelling    Current Outpatient Medications on File Prior to Visit  Medication Sig  . aspirin EC 81 MG tablet Take 81 mg by mouth. Takes twice a week  . b complex vitamins tablet Take 1 tablet by mouth daily.  Marland Kitchen BIOTIN PO Take 5,000 mcg by mouth.  . Cholecalciferol (VITAMIN D) 2000 units tablet Take 4,000 Units by mouth daily.   . cyclobenzaprine (FLEXERIL) 10 MG tablet Take 1 tablet (10 mg total) by mouth at bedtime as needed for muscle spasms.  . folic acid (FOLVITE) 672 MCG tablet Take 400 mcg by mouth daily.  . Magnesium 400 MG TABS Take by mouth daily.  . NON FORMULARY [place]- Herbal Supplements. Zyflamend for inflamation  . NON  FORMULARY [place]- Whole Meg fish oil  . OVER THE COUNTER MEDICATION   . Probiotic Product (ALIGN) 4 MG CAPS Take by mouth daily.  Marland Kitchen thiamine (VITAMIN B-1) 100 MG tablet Take 100 mg by mouth daily.  Marland Kitchen ZINC-VITAMIN C PO Take by mouth daily.  . diclofenac sodium (VOLTAREN) 1 % GEL Apply 4 g topically 4 (four) times daily as needed. (Patient not taking: Reported on 03/21/2018)   No  current facility-administered medications on file prior to visit.     ROS: all negative except above.   Physical Exam:  BP 124/80   Pulse 93   Temp (!) 97.2 F (36.2 C)   Ht 5' 6.5" (1.689 m)   Wt 169 lb 3.2 oz (76.7 kg)   SpO2 99%   BMI 26.90 kg/m   General Appearance: Well nourished, in no apparent distress. Eyes: conjunctiva no swelling or erythema ENT/Mouth: Hearing normal.  Neck: Supple Respiratory: Respiratory effort normal Cardio: RRR with no MRGs. Brisk peripheral pulses without edema.  Lymphatics: Non tender without lymphadenopathy.  Musculoskeletal: normal gait.  Skin: Warm, dry without rashes, ecchymosis. Large pendulous/inflamed skin tags to each axillar anteriorly at bra line.  Neuro: Sensation intact.  Psych: Awake and oriented X 3, normal affect, Insight and Judgment appropriate.     Izora Ribas, NP 4:26 PM [facility]

## 2018-03-21 ENCOUNTER — Encounter: Payer: Self-pay | Admitting: Adult Health

## 2018-03-21 ENCOUNTER — Ambulatory Visit: Payer: 59 | Admitting: Adult Health

## 2018-03-21 VITALS — BP 124/80 | HR 93 | Temp 97.2°F | Ht 66.5 in | Wt 169.2 lb

## 2018-03-21 DIAGNOSIS — L918 Other hypertrophic disorders of the skin: Secondary | ICD-10-CM | POA: Diagnosis not present

## 2018-03-21 DIAGNOSIS — I10 Essential (primary) hypertension: Secondary | ICD-10-CM

## 2018-03-21 NOTE — Patient Instructions (Signed)
Excision of Skin Lesions, Care After Refer to this sheet in the next few weeks. These instructions provide you with information about caring for yourself after your procedure. Your health care provider may also give you more specific instructions. Your treatment has been planned according to current medical practices, but problems sometimes occur. Call your health care provider if you have any problems or questions after your procedure. What can I expect after the procedure? After your procedure, it is common to have pain or discomfort at the excision site. Follow these instructions at home:  Take over-the-counter and prescription medicines only as told by your health care provider.  Follow instructions from your health care provider about: ? How to take care of your excision site. You should keep the site clean, dry, and protected for at least 48 hours. ? When and how you should change your bandage (dressing). ? When you should remove your dressing. ? Removing whatever was used to close your excision site.  Check the excision area every day for signs of infection. Watch for: ? Redness, swelling, or pain. ? Fluid, blood, or pus.  For bleeding, apply gentle but firm pressure to the area using a folded towel for 20 minutes.  Avoid high-impact exercise and activities until the stitches (sutures) are removed or the area heals.  Follow instructions from your health care provider about how to minimize scarring. Avoid sun exposure until the area has healed. Scarring should lessen over time.  Keep all follow-up visits as told by your health care provider. This is important. Contact a health care provider if:  You have a fever.  You have redness, swelling, or pain at the excision site.  You have fluid, blood, or pus coming from the excision site.  You have ongoing bleeding at the excision site.  You have pain that does not improve in 2-3 days after your procedure.  You notice skin  irregularities or changes in sensation. This information is not intended to replace advice given to you by your health care provider. Make sure you discuss any questions you have with your health care provider. Document Released: 08/19/2014 Document Revised: 09/10/2015 Document Reviewed: 05/21/2014 Elsevier Interactive Patient Education  2018 [employer]  

## 2018-03-22 ENCOUNTER — Ambulatory Visit: Payer: 59 | Admitting: Obstetrics & Gynecology

## 2018-03-28 ENCOUNTER — Other Ambulatory Visit: Payer: Self-pay | Admitting: Adult Health

## 2018-03-28 ENCOUNTER — Telehealth: Payer: Self-pay

## 2018-03-28 DIAGNOSIS — M79662 Pain in left lower leg: Secondary | ICD-10-CM | POA: Diagnosis not present

## 2018-03-28 DIAGNOSIS — M545 Low back pain: Secondary | ICD-10-CM | POA: Diagnosis not present

## 2018-03-28 MED ORDER — CEPHALEXIN 500 MG PO CAPS: ORAL_CAPSULE | ORAL | 0 refills | Status: DC

## 2018-03-28 NOTE — Telephone Encounter (Signed)
Recently had a skin tag removed and their is redness around the site. Was told to let us know if this happened, an antibiotic would be prescribed. Please advise

## 2018-03-29 NOTE — Telephone Encounter (Signed)
Patient aware.

## 2018-04-03 ENCOUNTER — Other Ambulatory Visit: Payer: Self-pay | Admitting: Adult Health

## 2018-04-03 MED ORDER — DOXYCYCLINE HYCLATE 100 MG PO CAPS: ORAL_CAPSULE | ORAL | 0 refills | Status: DC

## 2018-04-03 NOTE — Telephone Encounter (Signed)
Patient states that she just cannot tolerate the antibiotic given for the infected mole removal, asking what to do, still showing signs of infection.

## 2018-04-03 NOTE — Telephone Encounter (Signed)
Patient notified. States that it is better, just still a bit red, just couldn't continue taking the Keflex.

## 2018-04-17 ENCOUNTER — Other Ambulatory Visit: Payer: Self-pay

## 2018-04-17 ENCOUNTER — Ambulatory Visit (HOSPITAL_COMMUNITY)
Admission: RE | Admit: 2018-04-17 | Discharge: 2018-04-17 | Disposition: A | Discharge status: Home / Self Care | Payer: 59 | Source: Ambulatory Visit | Attending: Internal Medicine | Admitting: Internal Medicine

## 2018-04-17 ENCOUNTER — Encounter: Payer: Self-pay | Admitting: Vascular Surgery

## 2018-04-17 ENCOUNTER — Ambulatory Visit: Payer: 59 | Admitting: Vascular Surgery

## 2018-04-17 VITALS — BP 126/78 | HR 77 | Temp 97.7°F | Resp 20 | Ht 66.0 in | Wt 169.0 lb

## 2018-04-17 DIAGNOSIS — I872 Venous insufficiency (chronic) (peripheral): Secondary | ICD-10-CM

## 2018-04-17 NOTE — Progress Notes (Signed)
[facility] [place]  Patient name: Samantha Mckinney MRN: [id number] DOB: Aug 16, 1955 Sex: female  REASON FOR CONSULT: Evaluation lower extremity discomfort and venous hypertension  HPI: Samantha Mckinney is a 62 y.o. female, who is here today for evaluation of lower extremity discomfort.  She has a very strong family history of hypercoagulable state.  She has had no prior history of DVT or superficial thrombophlebitis.  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  He reports discomfort in both lower extremities.  She reports sensation of weakness especially when first arising from sitting position and feels as though she has to shuffle.  Also has tenderness diffusely over both lower extremities in her thighs and in her calves and her ankles.  Does have a known degenerative disc disease.  Has not required treatment of this.  Past Medical History:  Diagnosis Date  . Allergy   . Cataract   . Colon polyps   . Elevated cholesterol   . Endometriosis 02/02/2017  . Factor V Leiden ([place]) 02/02/2017  . GERD (gastroesophageal reflux disease)   . IBS (irritable bowel syndrome)   . Liver hemangioma 02/02/2017  . NASH (nonalcoholic steatohepatitis) 02/02/2017    Family History  Problem Relation Age of Onset  . Cirrhosis Mother   . Diabetes Mother   . Arthritis Mother   . Gout Mother   . Depression Mother   . Bowel Disease Mother   . Heart disease Father   . Diabetes Father   . Valvular heart disease Sister   . Depression Sister   . Diabetes Brother   . Hyperlipidemia Son   . Cancer Maternal Grandmother 60       colon cancer  . Colon cancer Maternal Grandmother   . Atrial fibrillation Brother   . Valvular heart disease Brother   . Hypertension Brother     SOCIAL HISTORY: Social History   Socioeconomic History  . Marital status: Married    Spouse name: Not on file  . Number of children: 1  . Years of education:  Not on file  . Highest education level: Not on file  Occupational History  . Not on file  Social Needs  . Financial resource strain: Not on file  . Food insecurity:    Worry: Not on file    Inability: Not on file  . Transportation needs:    Medical: Not on file    Non-medical: Not on file  Tobacco Use  . Smoking status: Never Smoker  . Smokeless tobacco: Never Used  Substance and Sexual Activity  . Alcohol use: Yes    Comment: OCC   . Drug use: Never  . Sexual activity: Not Currently    Partners: Male    Comment: 1st intercourse- 69, partners- 47, married- 68 yrs   Lifestyle  . Physical activity:    Days per week: Not on file    Minutes per session: Not on file  . Stress: Not on file  Relationships  . Social connections:    Talks on phone: Not on file    Gets together: Not on file    Attends religious service: Not on file    Active member of club or organization: Not on file    Attends meetings of clubs or organizations: Not on file    Relationship status: Not on file  . Intimate partner violence:    Fear of current or ex partner: Not on file    Emotionally abused: Not  on file    Physically abused: Not on file    Forced sexual activity: Not on file  Other Topics Concern  . Not on file  Social History Narrative  . Not on file    Allergies  Allergen Reactions  . Morphine And Related Swelling    Current Outpatient Medications  Medication Sig Dispense Refill  . aspirin EC 81 MG tablet Take 81 mg by mouth. Takes twice a week    . b complex vitamins tablet Take 1 tablet by mouth daily.    Marland Kitchen BIOTIN PO Take 5,000 mcg by mouth.    . Cholecalciferol (VITAMIN D) 2000 units tablet Take 4,000 Units by mouth daily.     . diclofenac sodium (VOLTAREN) 1 % GEL Apply 4 g topically 4 (four) times daily as needed. 128 g 6  . folic acid (FOLVITE) 786 MCG tablet Take 400 mcg by mouth daily.    . Magnesium 400 MG TABS Take by mouth daily.    . NON FORMULARY [place]- Herbal  Supplements. Zyflamend for inflamation    . NON FORMULARY [place]- Whole Meg fish oil    . OVER THE COUNTER MEDICATION     . Probiotic Product (ALIGN) 4 MG CAPS Take by mouth daily.    Marland Kitchen thiamine (VITAMIN B-1) 100 MG tablet Take 100 mg by mouth daily.    Marland Kitchen ZINC-VITAMIN C PO Take by mouth daily.    . cyclobenzaprine (FLEXERIL) 10 MG tablet Take 1 tablet (10 mg total) by mouth at bedtime as needed for muscle spasms. (Patient not taking: Reported on 04/17/2018) 90 tablet 0   No current facility-administered medications for this visit.     REVIEW OF SYSTEMS:  [X]  denotes positive finding, [ ]  denotes negative finding Cardiac  Comments:  Chest pain or chest pressure:    Shortness of breath upon exertion:    Short of breath when lying flat:    Irregular heart rhythm:        Vascular    Pain in calf, thigh, or hip brought on by ambulation: x   Pain in feet at night that wakes you up from your sleep:  x   Blood clot in your veins:    Leg swelling:  x       Pulmonary    Oxygen at home:    Productive cough:     Wheezing:         Neurologic    Sudden weakness in arms or legs:  x   Sudden numbness in arms or legs:  x   Sudden onset of difficulty speaking or slurred speech:    Temporary loss of vision in one eye:     Problems with dizziness:         Gastrointestinal    Blood in stool:     Vomited blood:         Genitourinary    Burning when urinating:     Blood in urine:        Psychiatric    Major depression:         Hematologic    Bleeding problems:    Problems with blood clotting too easily:        Skin    Rashes or ulcers:        Constitutional    Fever or chills:      PHYSICAL EXAM: Vitals:   04/17/18 1410  BP: 126/78  Pulse: 77  Resp: 20  Temp: 97.7 F (36.5  C)  SpO2: 97%  Weight: 169 lb (76.7 kg)  Height: 5' 6"  (1.676 m)    GENERAL: The patient is a well-nourished female, in no acute distress. The vital signs are documented above. CARDIOVASCULAR:  2+ radial and 2+ dorsalis pedis pulses bilaterally.  Tattered telangiectasia mainly over her thighs bilaterally.  Small varicosity in her right medial calf PULMONARY: There is good air exchange  ABDOMEN: Soft and non-tender  MUSCULOSKELETAL: There are no major deformities or cyanosis. NEUROLOGIC: No focal weakness or paresthesias are detected. SKIN: There are no ulcers or rashes noted. PSYCHIATRIC: The patient has a normal affect.  DATA:  Venous duplex and reflux study was reviewed with the patient.  This shows incompetence in her right saphenofemoral junction and in her proximal thigh.  On the left she does have some incompetence in the midportion of her saphenous vein in her thigh.  No significant deep venous reflux bilaterally  MEDICAL ISSUES: I discussed these findings in detail with the patient.  I do not feel that her leg pain is related to venous pathology.  I reassured her that she has completely normal arterial exam and near normal venous duplex and exam.  She does wear compression garments and this makes her legs feel better and I have encouraged her to continue this.  I suspect that her pain and neurologic changes are related to nerve issue and potentially back related issues.  She will discuss this further with her primary care physician and will consider neurologic evaluation   Rosetta Posner, MD [facility] Tel [phone] Pager [phone]

## 2018-05-30 ENCOUNTER — Encounter: Payer: Self-pay | Admitting: Obstetrics & Gynecology

## 2018-05-30 ENCOUNTER — Ambulatory Visit (INDEPENDENT_AMBULATORY_CARE_PROVIDER_SITE_OTHER): Payer: 59 | Admitting: Obstetrics & Gynecology

## 2018-05-30 VITALS — BP 140/90 | Ht 66.0 in | Wt 166.0 lb

## 2018-05-30 DIAGNOSIS — Z780 Asymptomatic menopausal state: Secondary | ICD-10-CM

## 2018-05-30 DIAGNOSIS — Z90710 Acquired absence of both cervix and uterus: Secondary | ICD-10-CM | POA: Diagnosis not present

## 2018-05-30 DIAGNOSIS — Z9079 Acquired absence of other genital organ(s): Secondary | ICD-10-CM

## 2018-05-30 DIAGNOSIS — Z01419 Encounter for gynecological examination (general) (routine) without abnormal findings: Secondary | ICD-10-CM | POA: Diagnosis not present

## 2018-05-30 DIAGNOSIS — Z90722 Acquired absence of ovaries, bilateral: Secondary | ICD-10-CM

## 2018-05-30 NOTE — Progress Notes (Signed)
Samantha Mckinney 20-May-1955 [id number]   History:    63 y.o. G1P1L1 Married  RP: Established patient presenting for annual gyn exam   HPI: S/P Total Hysterectomy with BSO.  Menopause, well on no HRT x 2012.  C/O Constipation with mild discomfort in mid to right lower abdomen, will call Gastro to reevaluate.  Urine normal.  Breasts normal.  BMI 26.79.  Will resume fitness activities.  Health labs with Fam MD.  Fam h/o Colon Ca.  Colonoscopy 12/2017 Precancerous polyp removed, 3 yr schedule.  Past medical history,surgical history, family history and social history were all reviewed and documented in the EPIC chart.  Gynecologic History No LMP recorded. Patient has had a hysterectomy. Contraception: status post hysterectomy Last Pap: 03/2017. Results were: Negative/HPV HR neg Last mammogram: 02/2018. Results were: Negative Bone Density: 10/2017 Normal, repeat at 5 yrs. Colonoscopy: 12/2017 Precancerous Polyp.  Repeat 3 yrs.  Obstetric History OB History  Gravida Para Term Preterm AB Living  1 1       1   SAB TAB Ectopic Multiple Live Births               # Outcome Date GA Lbr Len/2nd Weight Sex Delivery Anes PTL Lv  1 Para              ROS: A ROS was performed and pertinent positives and negatives are included in the history.  GENERAL: No fevers or chills. HEENT: No change in vision, no earache, sore throat or sinus congestion. NECK: No pain or stiffness. CARDIOVASCULAR: No chest pain or pressure. No palpitations. PULMONARY: No shortness of breath, cough or wheeze. GASTROINTESTINAL: No abdominal pain, nausea, vomiting or diarrhea, melena or bright red blood per rectum. GENITOURINARY: No urinary frequency, urgency, hesitancy or dysuria. MUSCULOSKELETAL: No joint or muscle pain, no back pain, no recent trauma. DERMATOLOGIC: No rash, no itching, no lesions. ENDOCRINE: No polyuria, polydipsia, no heat or cold intolerance. No recent change in weight. HEMATOLOGICAL: No anemia or easy bruising or  bleeding. NEUROLOGIC: No headache, seizures, numbness, tingling or weakness. PSYCHIATRIC: No depression, no loss of interest in normal activity or change in sleep pattern.     Exam:   BP 140/90   Ht 5' 6"  (1.676 m)   Wt 166 lb (75.3 kg)   BMI 26.79 kg/m   Body mass index is 26.79 kg/m.  General appearance : Well developed well nourished female. No acute distress HEENT: Eyes: no retinal hemorrhage or exudates,  Neck supple, trachea midline, no carotid bruits, no thyroidmegaly Lungs: Clear to auscultation, no rhonchi or wheezes, or rib retractions  Heart: Regular rate and rhythm, no murmurs or gallops Breast:Examined in sitting and supine position were symmetrical in appearance, no palpable masses or tenderness,  no skin retraction, no nipple inversion, no nipple discharge, no skin discoloration, no axillary or supraclavicular lymphadenopathy Abdomen: no palpable masses or tenderness, no rebound or guarding Extremities: no edema or skin discoloration or tenderness  Pelvic: Vulva: Normal             Vagina: No gross lesions or discharge  Cervix/Uterus absent  Adnexa  Without masses or tenderness  Anus: Normal   Assessment/Plan:  63 y.o. female for annual exam   1. Well female exam with routine gynecological exam Gynecologic exam status post total hysterectomy with BSO.  Pap test in December 2018 was negative with negative high-risk HPV, no indication to repeat this year.  Breast exam normal.  Screening mammogram November 2019 was negative.  Family  history of colon cancer.  Colonoscopy September 2019 precancerous polyp removed, repeat colonoscopy in 3 years.  Health labs with family physician.  2. S/P total hysterectomy and BSO (bilateral salpingo-oophorectomy)  3. Postmenopausal Well on no hormone replacement therapy.  Bone density July 2019 was completely normal.  Repeat a bone density at 5 years.  Vitamin D D supplements, calcium intake of 1.5 g/day including nutritional and  supplemental, regular weightbearing physical activity is recommended.  Princess Bruins MD, 3:05 PM 05/30/2018

## 2018-05-30 NOTE — Patient Instructions (Signed)
1. Well female exam with routine gynecological exam Gynecologic exam status post total hysterectomy with BSO.  Pap test in December 2018 was negative with negative high-risk HPV, no indication to repeat this year.  Breast exam normal.  Screening mammogram November 2019 was negative.  Family history of colon cancer.  Colonoscopy September 2019 precancerous polyp removed, repeat colonoscopy in 3 years.  Health labs with family physician.  2. S/P total hysterectomy and BSO (bilateral salpingo-oophorectomy)  3. Postmenopausal Well on no hormone replacement therapy.  Bone density July 2019 was completely normal.  Repeat a bone density at 5 years.  Vitamin D D supplements, calcium intake of 1.5 g/day including nutritional and supplemental, regular weightbearing physical activity is recommended.  Samantha Mckinney, it was a pleasure seeing you today!

## 2018-06-14 NOTE — Progress Notes (Signed)
Subjective:    Patient ID: Samantha Mckinney, female    DOB: 1956-03-10, 63 y.o.   MRN: [id number]  HPI 63 y.o. WF presents with abnormal cramping/AB pain.  She states had 8 days without a BM in Jan, not passing much gas at that time, took miralax and had a lot of soft stool with AB cramping, without nausea or vomiting. Then she stayed on miralax and stool stayed normal, then last Thursday had severe AB cramping Friday-Sunday with diarrhea, having 4-5 times a day. No blood in stool, black stool, no fever, chills, she was having nausea.   She has had multiple AB surgeries, last AB surgery was 1998. She has had 7 laparoscopic surgeries for endometriosis, last one was 1991, and her gallbladder removed. She had colonoscopy 12/2017 with Dr. Havery Moros.  Given keflex Dec 17th x 3-4 days but then stopped due to nausea.    Blood pressure 122/70, pulse 77, temperature (!) 97.5 F (36.4 C), height 5' 6"  (1.676 m), weight 166 lb 3.2 oz (75.4 kg), SpO2 98 %.  Medications Current Outpatient Medications on File Prior to Visit  Medication Sig  . aspirin EC 81 MG tablet Take 81 mg by mouth. Takes twice a week  . b complex vitamins tablet Take 1 tablet by mouth daily.  Marland Kitchen BIOTIN PO Take 5,000 mcg by mouth.  . Cholecalciferol (VITAMIN D) 2000 units tablet Take 4,000 Units by mouth daily.   . cyclobenzaprine (FLEXERIL) 10 MG tablet Take 1 tablet (10 mg total) by mouth at bedtime as needed for muscle spasms.  . diclofenac sodium (VOLTAREN) 1 % GEL Apply 4 g topically 4 (four) times daily as needed.  . folic acid (FOLVITE) 542 MCG tablet Take 400 mcg by mouth daily.  . Magnesium 400 MG TABS Take by mouth daily.  . NON FORMULARY [place]- Herbal Supplements. Zyflamend for inflamation  . NON FORMULARY [place]- Whole Meg fish oil  . OVER THE COUNTER MEDICATION   . Probiotic Product (ALIGN) 4 MG CAPS Take by mouth daily.  Marland Kitchen thiamine (VITAMIN B-1) 100 MG tablet Take 100 mg by mouth daily.  Marland Kitchen ZINC-VITAMIN C PO  Take by mouth daily.   No current facility-administered medications on file prior to visit.     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  She  has a past surgical history that includes Cholecystectomy; Abdominal hysterectomy (1998); Laparoscopic endometriosis fulguration; Hemorrhoid surgery (1996); and Back surgery (2000).   Review of Systems  Constitutional: Positive for fatigue. Negative for chills, diaphoresis and fever.  HENT: Negative.   Respiratory: Negative.  Negative for cough.   Cardiovascular: Negative.   Gastrointestinal: Positive for abdominal pain, constipation, diarrhea and nausea. Negative for abdominal distention, anal bleeding, blood in stool, rectal pain and vomiting.  Genitourinary: Negative.   Musculoskeletal: Negative for arthralgias, back pain, gait problem, joint swelling, myalgias, neck pain and neck stiffness.  Skin: Negative.   Neurological: Positive for dizziness. Negative for headaches.       Objective:   Physical Exam Vitals signs reviewed.  Constitutional:      Appearance: She is well-developed.  Neck:     Musculoskeletal: Normal range of motion and neck supple.  Cardiovascular:     Rate and Rhythm: Normal rate and regular rhythm.  Pulmonary:     Effort: Pulmonary effort is normal.  Breath sounds: Normal breath sounds.  Abdominal:     General: Bowel sounds are normal.     Palpations: Abdomen is soft. There is no mass.     Tenderness: There is abdominal tenderness. There is guarding. There is no rebound.  Skin:    General: Skin is warm and dry.     Findings: No rash.  Neurological:     Mental Status: She is alert and oriented to person, place, and time.        Assessment & Plan:  AB pain LLQ pain with  diarrhea, no fever, chills, she has also has multiple AB surgeries and has some aspects of partial SBO, will proceed with CT scan with contrast Liquid diet and proceed to bland diet Call the office or go to the ER if bloody stools, persistent diarrhea, not peeing regularly, unable to take oral fluids, vomiting, high fever, severe weakness, abdominal pain or failure to improve in 3 days. If negative may get Cdiff testing since recent very short course of ABX (keflex)

## 2018-06-15 ENCOUNTER — Ambulatory Visit: Payer: 59 | Admitting: Physician Assistant

## 2018-06-15 ENCOUNTER — Encounter: Payer: Self-pay | Admitting: Physician Assistant

## 2018-06-15 ENCOUNTER — Ambulatory Visit
Admission: RE | Admit: 2018-06-15 | Discharge: 2018-06-15 | Disposition: A | Discharge status: Home / Self Care | Payer: 59 | Source: Ambulatory Visit | Attending: Physician Assistant | Admitting: Physician Assistant

## 2018-06-15 DIAGNOSIS — R1032 Left lower quadrant pain: Secondary | ICD-10-CM

## 2018-06-15 DIAGNOSIS — R109 Unspecified abdominal pain: Secondary | ICD-10-CM | POA: Diagnosis not present

## 2018-06-15 LAB — CBC WITH DIFFERENTIAL/PLATELET
Absolute Monocytes: 378 cells/uL (ref 200–950)
BASOS PCT: 1.7 %
Basophils Absolute: 71 cells/uL (ref 0–200)
EOS ABS: 71 {cells}/uL (ref 15–500)
EOS PCT: 1.7 %
HCT: 40.5 % (ref 35.0–45.0)
HEMOGLOBIN: 14 g/dL (ref 11.7–15.5)
Lymphs Abs: 1634 cells/uL (ref 850–3900)
MCH: 31.7 pg (ref 27.0–33.0)
MCHC: 34.6 g/dL (ref 32.0–36.0)
MCV: 91.6 fL (ref 80.0–100.0)
MONOS PCT: 9 %
MPV: 10.6 fL (ref 7.5–12.5)
NEUTROS ABS: 2045 {cells}/uL (ref 1500–7800)
Neutrophils Relative %: 48.7 %
Platelets: 264 10*3/uL (ref 140–400)
RBC: 4.42 10*6/uL (ref 3.80–5.10)
RDW: 11.6 % (ref 11.0–15.0)
Total Lymphocyte: 38.9 %
WBC: 4.2 10*3/uL (ref 3.8–10.8)

## 2018-06-15 LAB — COMPLETE METABOLIC PANEL WITH GFR
AG RATIO: 2 (calc) (ref 1.0–2.5)
ALT: 14 U/L (ref 6–29)
AST: 16 U/L (ref 10–35)
Albumin: 4.5 g/dL (ref 3.6–5.1)
Alkaline phosphatase (APISO): 56 U/L (ref 37–153)
BUN: 17 mg/dL (ref 7–25)
CO2: 29 mmol/L (ref 20–32)
Calcium: 9.6 mg/dL (ref 8.6–10.4)
Chloride: 106 mmol/L (ref 98–110)
Creat: 0.85 mg/dL (ref 0.50–0.99)
GFR, Est African American: 86 mL/min/{1.73_m2} (ref >=60)
GFR, Est Non African American: 74 mL/min/{1.73_m2} (ref >=60)
Globulin: 2.2 g/dL (calc) (ref 1.9–3.7)
Glucose, Bld: 96 mg/dL (ref 65–99)
Potassium: 5.1 mmol/L (ref 3.5–5.3)
Sodium: 142 mmol/L (ref 135–146)
Total Bilirubin: 0.4 mg/dL (ref 0.2–1.2)
Total Protein: 6.7 g/dL (ref 6.1–8.1)

## 2018-06-15 LAB — LACTATE DEHYDROGENASE: LDH: 170 U/L (ref 120–250)

## 2018-06-15 IMAGING — CR DG ABDOMEN 1V
1 series · 1 of 1 positions shown · non-contrast
Comparison: [DATE]

CLINICAL DATA: Left lower quadrant abdominal pain.

EXAM:
ABDOMEN - 1 VIEW

[w abdomen upright]
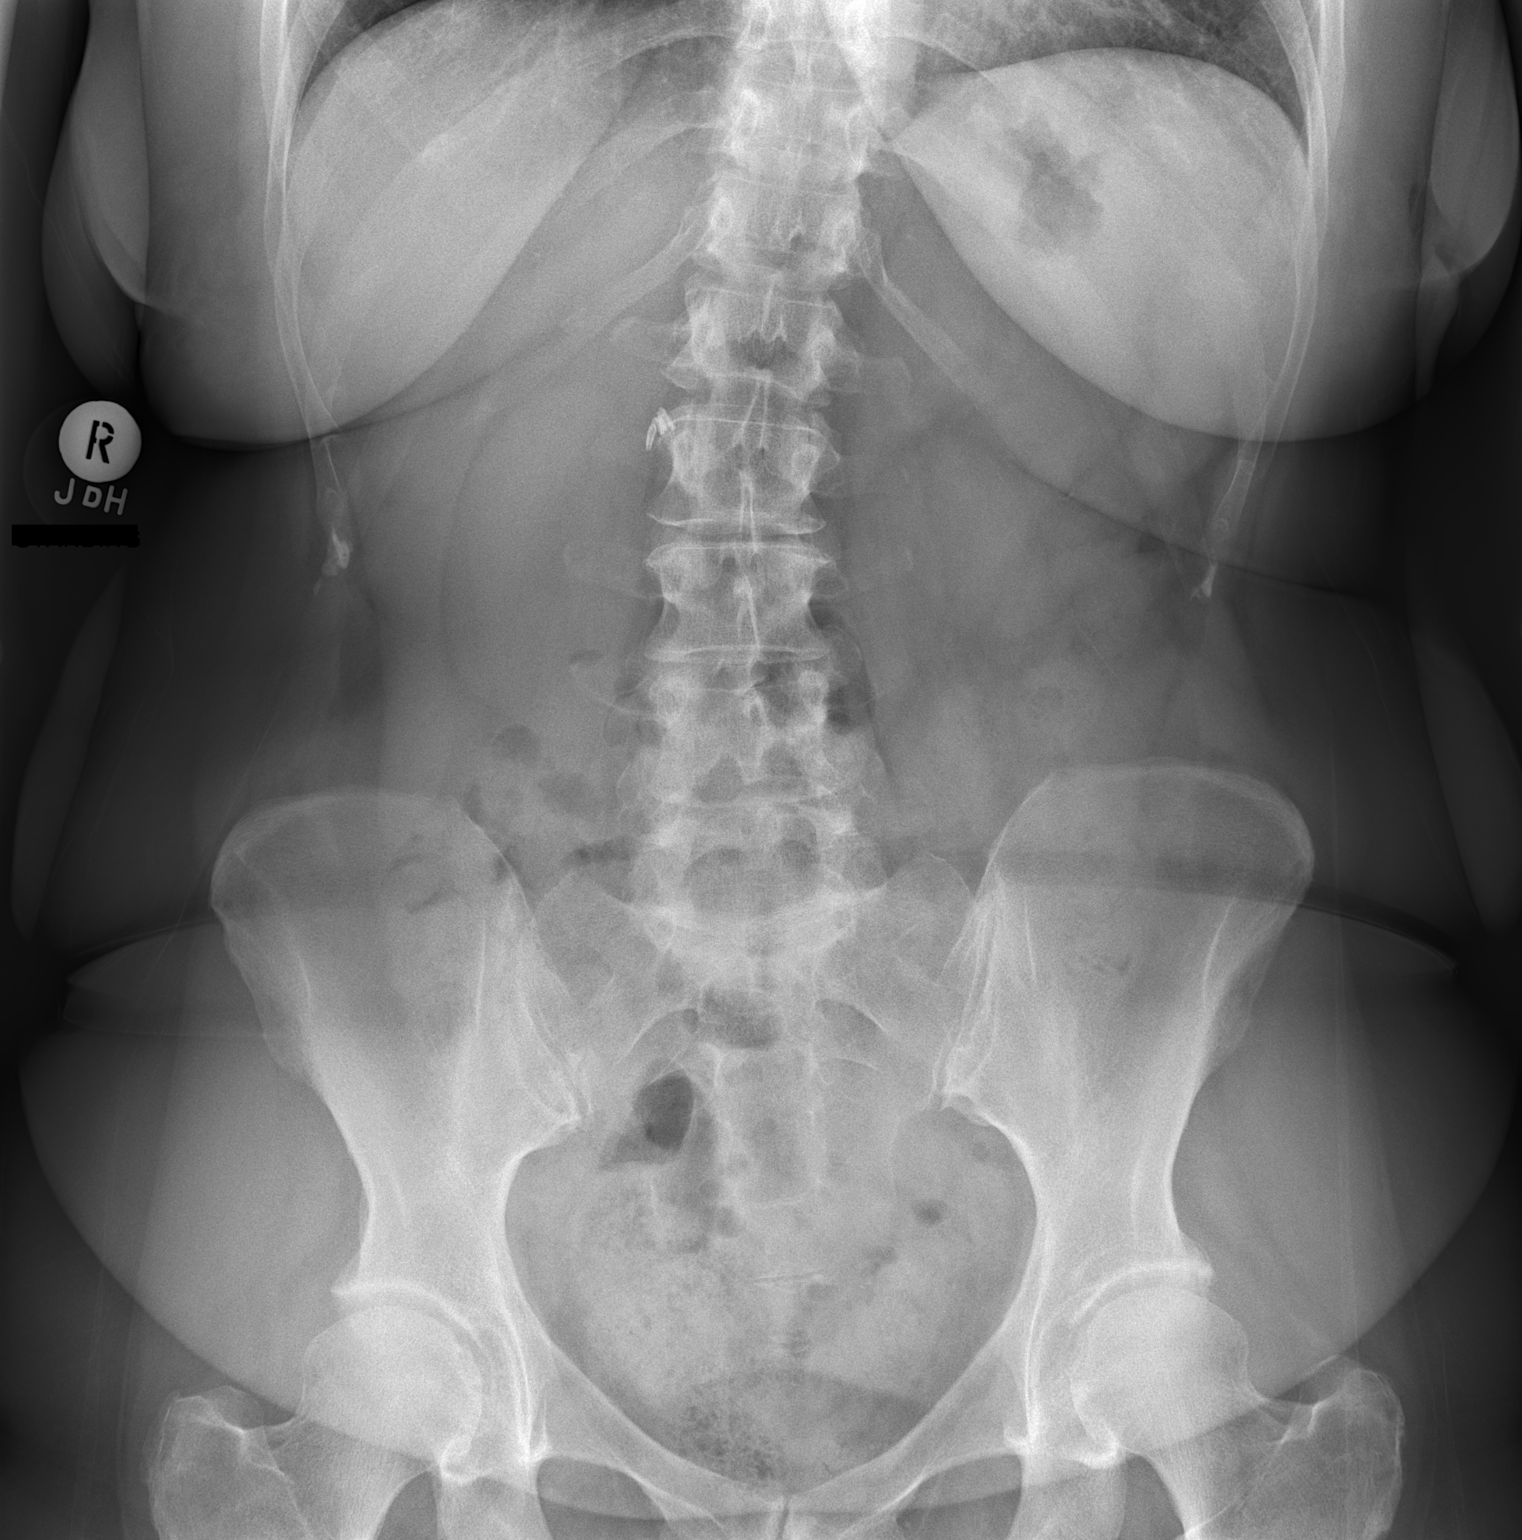

[1 of 1 positions shown; findings below may reference images not displayed]

FINDINGS: Mild curvature in the thoracolumbar spine is similar to the previous
examination. Again noted are surgical clips in the right upper
abdomen. Nonobstructive bowel gas pattern. Stool in the pelvis.
Limited evaluation for free air because the right hemidiaphragm is
incompletely imaged. No acute bone findings. No large abdominal or
pelvic calcifications.
IMPRESSION: 1. Normal bowel gas pattern.
2. Stool in the pelvis.

## 2018-06-15 MED ORDER — CIPROFLOXACIN HCL 500 MG PO TABS: 500.0000 mg | ORAL_TABLET | Freq: Two times a day (BID) | ORAL | 0 refills | Status: AC

## 2018-06-15 MED ORDER — METRONIDAZOLE 500 MG PO TABS: 500.0000 mg | ORAL_TABLET | Freq: Three times a day (TID) | ORAL | 0 refills | Status: AC

## 2018-06-15 NOTE — Patient Instructions (Signed)
Start on cipro/flaygl for diverticulitis Do liquid diet for a few days with jello/brothes then go to bowel rest Bowel rest means no wheat/fiber, so please eat white stuff like potatoes, soup, crackers until you are feeling better and then slowly advance your diet back to veggies and fiber like wheat.   Call the office or go to the ER if bloody stools, persistent diarrhea, not peeing regularly, unable to take oral fluids, vomiting, high fever, severe weakness, abdominal pain or failure to improve in 3 days.   Diverticulitis Diverticulitis is inflammation or infection of small pouches in your colon that form when you have a condition called diverticulosis. The pouches in your colon are called diverticula. Your colon, or large intestine, is where water is absorbed and stool is formed. Complications of diverticulitis can include:  Bleeding.  Severe infection.  Severe pain.  Perforation of your colon.  Obstruction of your colon.  What are the causes? Diverticulitis is caused by bacteria. Diverticulitis happens when stool becomes trapped in diverticula. This allows bacteria to grow in the diverticula, which can lead to inflammation and infection. What increases the risk? People with diverticulosis are at risk for diverticulitis. Eating a diet that does not include enough fiber from fruits and vegetables may make diverticulitis more likely to develop. What are the signs or symptoms? Symptoms of diverticulitis may include:  Abdominal pain and tenderness. The pain is normally located on the left side of the abdomen, but may occur in other areas.  Fever and chills.  Bloating.  Cramping.  Nausea.  Vomiting.  Constipation.  Diarrhea.  Blood in your stool.  How is this diagnosed? Your health care provider will ask you about your medical history and do a physical exam. You may need to have tests done because many medical conditions can cause the same symptoms as diverticulitis.  Tests may include:  Blood tests.  Urine tests.  Imaging tests of the abdomen, including X-rays and CT scans.  When your condition is under control, your health care provider may recommend that you have a colonoscopy. A colonoscopy can show how severe your diverticula are and whether something else is causing your symptoms. How is this treated? Most cases of diverticulitis are mild and can be treated at home. Treatment may include:  Taking over-the-counter pain medicines.  Following a clear liquid diet.  Taking antibiotic medicines by mouth for 7-10 days.  More severe cases may be treated at a hospital. Treatment may include:  Not eating or drinking.  Taking prescription pain medicine.  Receiving antibiotic medicines through an IV tube.  Receiving fluids and nutrition through an IV tube.  Surgery.  Follow these instructions at home:  Follow your health care provider's instructions carefully.  Follow a full liquid diet or other diet as directed by your health care provider. After your symptoms improve, your health care provider may tell you to change your diet. He or she may recommend you eat a high-fiber diet. Fruits and vegetables are good sources of fiber. Fiber makes it easier to pass stool.  Take fiber supplements or probiotics as directed by your health care provider.  Only take medicines as directed by your health care provider.  Keep all your follow-up appointments. Contact a health care provider if:  Your pain does not improve.  You have a hard time eating food.  Your bowel movements do not return to normal. Get help right away if:  Your pain becomes worse.  Your symptoms do not get better.  Your  symptoms suddenly get worse.  You have a fever.  You have repeated vomiting.  You have bloody or black, tarry stools. This information is not intended to replace advice given to you by your health care provider. Make sure you discuss any questions you have  with your health care provider. Document Released: 01/12/2005 Document Revised: 09/10/2015 Document Reviewed: 02/27/2013 Elsevier Interactive Patient Education  2017 [employer].

## 2018-09-13 ENCOUNTER — Ambulatory Visit: Payer: Self-pay | Admitting: Physician Assistant

## 2018-09-18 NOTE — Progress Notes (Signed)
3 month follow up  Assessment and Plan:  IBS-C/constipation Will try linzess 145, can increase to higher dose If not better suggest GI referral.   Edema Get compression socks If not better will do HCTZ but warned that can worsening renal function  Medication management -     CBC with Differential/Platelet -     Hemoglobin A1c -     Magnesium  Factor V Leiden (HCC) Continue ASA, monitor   NASH (nonalcoholic steatohepatitis) Check labs, avoid tylenol, alcohol, weight loss advised.  -     COMPLETE METABOLIC PANEL WITH GFR  Hiatal hernia Continue PPI/H2 blocker, diet discussed  Endometriosis ? Contributing to AB pain, will treat with IBS-C and if not better suggest GYN or GI  Vitamin D deficiency -     VITAMIN D 25 Hydroxy (Vit-D Deficiency, Fractures)  Mixed hyperlipidemia check lipids decrease fatty foods increase activity.  -     TSH -     Lipid panel -     EKG 12-Lead  Venous insufficiency Likely varicose veins, no warmth, tenderness, hard cords, will refer for evaluation since having pain.    Discussed med's effects and SE's. Screening labs and tests as requested with regular follow-up as recommended. Over 30 minutes of exam, counseling, chart review, and complex, high level critical decision making was performed this visit.  Future Appointments  Date Time Provider [place]  03/04/2019  3:00 PM Vicie Mutters, PA-C [id number] None  06/07/2019  2:00 PM Princess Bruins, MD [facility]    HPI  63 y.o. 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  Say she has "lumps" on bilateral legs. She wears compression socks.  She states she feels she has fluid everywhere, in AB, in legs, has not been this bad in 2-3 years. Drinks 3 16 ounce bottle.   She has history of IBS C, had AB xray 05/2018 showed stool burden. Colonoscopy last year that was normal other than polyps.   Patient states that occ her urine has a pink tint to it. Worse first thing in the AM. Had CT AB 2018 which was normal, no history of kidney stones.   She has history of NASH, CKD3 and elevated ANA and homocysteine.  Has had a negative connective tissue work up and has seen rheum.   Her blood pressure has been controlled at home, today their BP is BP: 128/76 She does not workout due to pain. She denies chest pain, shortness of breath, dizziness.    She is on cholesterol medication and denies myalgias. Her cholesterol is not at goal. The cholesterol last visit was:   Lab Results  Component Value Date   CHOL 234 (H) 02/21/2018   HDL 64 02/21/2018   LDLCALC 142 (H) 02/21/2018   TRIG 150 (H) 02/21/2018   CHOLHDL 3.7 02/21/2018   Lab Results  Component Value Date   CREATININE 0.85 06/15/2018   BUN 17 06/15/2018   NA 142 06/15/2018   K 5.1 06/15/2018   CL 106 06/15/2018   CO2 29 06/15/2018    She has been working  on diet and exercise for prediabetes, and denies nausea, paresthesia of the feet, polydipsia and polyuria. Last A1C in the office was:  Lab Results  Component Value Date   HGBA1C 5.6 02/21/2018   Patient is on Vitamin D supplement.   Lab Results  Component Value Date   VD25OH 38 02/21/2018     BMI is Body mass index is 27.76 kg/m., she is working on diet and exercise. Wt Readings from Last 3 Encounters:  09/20/18 172 lb (78 kg)  06/15/18 166 lb 3.2 oz (75.4 kg)  05/30/18 166 lb (75.3 kg)    Lab Results  Component Value Date   GFRNONAA 74 06/15/2018    Current Medications:  Current Outpatient Medications on File  Prior to Visit  Medication Sig Dispense Refill  . aspirin EC 81 MG tablet Take 81 mg by mouth. Takes twice a week    . b complex vitamins tablet Take 1 tablet by mouth daily.    Marland Kitchen BIOTIN PO Take 5,000 mcg by mouth.    . Cholecalciferol (VITAMIN D) 2000 units tablet Take 4,000 Units by mouth daily.     Marland Kitchen CINNAMON PO Take by mouth.    . cyclobenzaprine (FLEXERIL) 10 MG tablet Take 1 tablet (10 mg total) by mouth at bedtime as needed for muscle spasms. 90 tablet 0  . diclofenac sodium (VOLTAREN) 1 % GEL Apply 4 g topically 4 (four) times daily as needed. 500 g 6  . Magnesium 400 MG TABS Take by mouth daily.    . NON FORMULARY [place]- Herbal Supplements. Zyflamend for inflamation    . NON FORMULARY [place]- Whole Meg fish oil    . OVER THE COUNTER MEDICATION     . Probiotic Product (ALIGN) 4 MG CAPS Take by mouth daily.    Marland Kitchen thiamine (VITAMIN B-1) 100 MG tablet Take 100 mg by mouth daily.    Marland Kitchen ZINC-VITAMIN C PO Take by mouth daily.     No current facility-administered medications on file prior to visit.    Allergies:  Allergies  Allergen Reactions  . 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   Surgical History:  She has a past surgical history that includes Cholecystectomy; Abdominal hysterectomy (1998); Laparoscopic endometriosis fulguration; Hemorrhoid surgery (1996); and Back surgery (2000). Family History:  Herfamily history includes Arthritis in her mother; Atrial fibrillation in her brother; Bowel Disease in her mother; Cancer (age of onset: 25) in her maternal grandmother; Cirrhosis in her mother; Colon cancer in her maternal grandmother; Depression in her mother and sister; Diabetes in her brother, father, and  mother; Gout in her mother; Heart disease in her father; Hyperlipidemia in her son; Hypertension in her brother; Valvular heart disease in her brother and sister. Social History:  She reports that she has never smoked. She has never used smokeless tobacco. She reports current alcohol use. She reports that she does not use drugs.  Review of Systems: Review of Systems  Constitutional: Positive for malaise/fatigue. Negative for chills, diaphoresis, fever and weight loss.  HENT: Negative.   Eyes: Negative.   Respiratory: Negative.   Cardiovascular: Positive for leg swelling. Negative for chest pain, palpitations, orthopnea, claudication and PND.  Gastrointestinal: Positive for heartburn. Negative for abdominal pain, blood in stool, constipation, diarrhea, melena, nausea and vomiting.  Genitourinary: Negative.   Musculoskeletal: Positive for myalgias. Negative for back pain, falls, joint pain and neck pain.  Skin: Negative for itching and rash.  Neurological: Negative.  Negative for weakness.  Endo/Heme/Allergies: Negative.   Psychiatric/Behavioral: Negative for hallucinations, memory loss, substance abuse and suicidal ideas. The patient has insomnia. The patient is not nervous/anxious.     Physical Exam: Estimated body mass index is 27.76 kg/m as calculated from the following:   Height as of this encounter: 5' 6"  (1.676 m).   Weight as of this encounter: 172 lb (78 kg). BP 128/76   Pulse 75   Temp 97.9 F (36.6 C)   Ht 5' 6"  (1.676 m)   Wt 172 lb (78 kg)   SpO2 98%   BMI 27.76 kg/m  General Appearance: Well nourished, in no apparent distress.  Eyes: PERRLA, EOMs, conjunctiva no swelling or erythema, normal fundi and vessels.  Sinuses: No Frontal/maxillary tenderness  ENT/Mouth: Ext aud canals clear, normal light reflex with TMs without erythema, bulging. Good dentition. No erythema, swelling, or exudate on post pharynx. Tonsils not swollen or erythematous. Hearing normal.  Neck:  Supple, thyroid normal. No bruits  Respiratory: Respiratory effort normal, BS equal bilaterally without rales, rhonchi, wheezing or stridor.  Cardio: RRR without murmurs, rubs or gallops. Brisk peripheral pulses without edema.  Chest: symmetric, with normal excursions and percussion.  Breasts: defer Abdomen: Soft, left upper quadrant tenderness, no guarding, rebound, hernias, masses, or organomegaly.  Lymphatics: Non tender without lymphadenopathy.  Genitourinary: defer Musculoskeletal: Full ROM all peripheral extremities,5/5 strength, and normal gait. Right medial joint line tenderness of knee, no effusion.  Skin: + varicose veins bilateral legs,no erythema, swelling, warmth. Patient in compression socks today. Warm, dry without rashes, lesions, ecchymosis. Neuro: Cranial nerves intact, reflexes equal bilaterally. Normal muscle tone, no cerebellar symptoms. Sensation intact.  Psych: Awake and oriented X 3, normal affect, Insight and Judgment appropriate.    Vicie Mutters 3:48 PM [facility]

## 2018-09-20 ENCOUNTER — Ambulatory Visit: Payer: 59 | Admitting: Physician Assistant

## 2018-09-20 ENCOUNTER — Encounter: Payer: Self-pay | Admitting: Physician Assistant

## 2018-09-20 ENCOUNTER — Other Ambulatory Visit: Payer: Self-pay

## 2018-09-20 VITALS — BP 128/76 | HR 75 | Temp 97.9°F | Ht 66.0 in | Wt 172.0 lb

## 2018-09-20 DIAGNOSIS — E782 Mixed hyperlipidemia: Secondary | ICD-10-CM | POA: Diagnosis not present

## 2018-09-20 DIAGNOSIS — N189 Chronic kidney disease, unspecified: Secondary | ICD-10-CM | POA: Diagnosis not present

## 2018-09-20 DIAGNOSIS — E559 Vitamin D deficiency, unspecified: Secondary | ICD-10-CM

## 2018-09-20 DIAGNOSIS — D6851 Activated protein C resistance: Secondary | ICD-10-CM

## 2018-09-20 DIAGNOSIS — R319 Hematuria, unspecified: Secondary | ICD-10-CM

## 2018-09-20 DIAGNOSIS — R7989 Other specified abnormal findings of blood chemistry: Secondary | ICD-10-CM

## 2018-09-20 DIAGNOSIS — Z79899 Other long term (current) drug therapy: Secondary | ICD-10-CM

## 2018-09-20 DIAGNOSIS — K7581 Nonalcoholic steatohepatitis (NASH): Secondary | ICD-10-CM

## 2018-09-20 DIAGNOSIS — E7211 Homocystinuria: Secondary | ICD-10-CM

## 2018-09-20 MED ORDER — HYDROCHLOROTHIAZIDE 12.5 MG PO TABS: 25.0000 mg | ORAL_TABLET | Freq: Every day | ORAL | 3 refills | Status: DC

## 2018-09-20 NOTE — Patient Instructions (Addendum)
[email or web address]  Check out [facility]om [facility] Check out mini habits for weight loss book  Check out 7 min workout app by Wynetta Emery and Wynetta Emery  I'm going to give you linzess dosage 145 Take on an empty stomach, at least 30 minutes before your first meal of the day. Take your medicine at regular intervals.  Take this for at least 2 weeks and see how you do, if it helps but not enough we can try the higher dose which is for IBS-C  This is a fluid pill that makes you pee more often, take it in the morning  If can deplete your magnesium and potassium so if you get leg cramps let me know and we may need to replace this Monitor your weight and blood pressure while on it If you get dizzy while on it stop the medication and call me Any questions or concerns stop the medication and call the office.  We will see you in 4-6 weeks in the office to check labs   Linaclotide oral capsules What is this medicine? LINACLOTIDE (lin a KLOE tide) is used to treat irritable bowel syndrome (IBS) with constipation as the main problem. It may also be used for relief of chronic constipation. This medicine may be used for other purposes; ask your health care provider or pharmacist if you have questions. COMMON BRAND NAME(S): Linzess What should I tell my health care provider before I take this medicine? They need to know if you have any of these conditions: -history of stool (fecal) impaction -now have diarrhea or have diarrhea often -other medical condition -stomach or intestinal disease, including bowel obstruction or abdominal adhesions -an unusual or allergic reaction to linaclotide, other medicines, foods, dyes, or preservatives -pregnant or trying to get pregnant -breast-feeding How should I use this medicine? Take this medicine by mouth with a glass of water. Follow the directions on the prescription label. Do not cut, crush or chew this medicine. Take on an empty stomach, at least  30 minutes before your first meal of the day. Take your medicine at regular intervals. Do not take your medicine more often than directed. Do not stop taking except on your doctor's advice. A special MedGuide will be given to you by the pharmacist with each prescription and refill. Be sure to read this information carefully each time. Talk to your pediatrician regarding the use of this medicine in children. This medicine is not approved for use in children. Overdosage: If you think you have taken too much of this medicine contact a poison control center or emergency room at once. NOTE: This medicine is only for you. Do not share this medicine with others. What if I miss a dose? If you miss a dose, just skip that dose. Wait until your next dose, and take only that dose. Do not take double or extra doses. What may interact with this medicine? -certain medicines for bowel problems or bladder incontinence (these can cause constipation) This list may not describe all possible interactions. Give your health care provider a list of all the medicines, herbs, non-prescription drugs, or dietary supplements you use. Also tell them if you smoke, drink alcohol, or use illegal drugs. Some items may interact with your medicine. What should I watch for while using this medicine? Visit your doctor for regular check ups. Tell your doctor if your symptoms do not get better or if they get worse. Diarrhea is a common side effect of this medicine. It  often begins within 2 weeks of starting this medicine. Stop taking this medicine and call your doctor if you get severe diarrhea. Stop taking this medicine and call your doctor or go to the nearest hospital emergency room right away if you develop unusual or severe stomach-area (abdominal) pain, especially if you also have bright red, bloody stools or black stools that look like tar. What side effects may I notice from receiving this medicine? Side effects that you should  report to your doctor or health care professional as soon as possible: -allergic reactions like skin rash, itching or hives, swelling of the face, lips, or tongue -black, tarry stools -bloody or watery diarrhea -new or worsening stomach pain -severe or prolonged diarrhea Side effects that usually do not require medical attention (report to your doctor or health care professional if they continue or are bothersome): -bloating -gas -loose stools This list may not describe all possible side effects. Call your doctor for medical advice about side effects. You may report side effects to FDA at 1-800-FDA-1088. Where should I keep my medicine? Keep out of the reach of children. Store at room temperature between 20 and 25 degrees C (68 and 77 degrees F). Keep this medicine in the original container. Keep tightly closed in a dry place. Do not remove the desiccant packet from the bottle, it helps to protect your medicine from moisture. Throw away any unused medicine after the expiration date. NOTE: This sheet is a summary. It may not cover all possible information. If you have questions about this medicine, talk to your doctor, pharmacist, or health care provider.  2019 Elsevier/Gold Standard (2015-05-07 12:17:04)   Irritable Bowel Syndrome, Adult  Irritable bowel syndrome (IBS) is a group of symptoms that affects the organs responsible for digestion (gastrointestinal or GI tract). IBS is not one specific disease. To regulate how the GI tract works, the body sends signals back and forth between the intestines and the brain. If you have IBS, there may be a problem with these signals. As a result, the GI tract does not function normally. The intestines may become more sensitive and overreact to certain things. This may be especially true when you eat certain foods or when you are under stress. There are four types of IBS. These may be determined based on the consistency of your stool (feces):  IBS with  diarrhea.  IBS with constipation.  Mixed IBS.  Unsubtyped IBS. It is important to know which type of IBS you have. Certain treatments are more likely to be helpful for certain types of IBS. What are the causes? The exact cause of IBS is not known. What increases the risk? You may have a higher risk for IBS if you:  Are female.  Are younger than 64.  Have a family history of IBS.  Have a mental health condition, such as depression, anxiety, or post-traumatic stress disorder.  Have had a bacterial infection of your GI tract. What are the signs or symptoms? Symptoms of IBS vary from person to person. The main symptom is abdominal pain or discomfort. Other symptoms usually include one or more of the following:  Diarrhea, constipation, or both.  Abdominal swelling or bloating.  Feeling full after eating a small or regular-sized meal.  Frequent gas.  Mucus in the stool.  A feeling of having more stool left after a bowel movement. Symptoms tend to come and go. They may be triggered by stress, mental health conditions, or certain foods. How is this diagnosed? This  condition may be diagnosed based on a physical exam, your medical history, and your symptoms. You may have tests, such as:  Blood tests.  Stool test.  X-rays.  CT scan.  Colonoscopy. This is a procedure in which your GI tract is viewed with a long, thin, flexible tube. How is this treated? There is no cure for IBS, but treatment can help relieve symptoms. Treatment depends on the type of IBS you have, and may include:  Changes to your diet, such as: ? Avoiding foods that cause symptoms. ? Drinking more water. ? Following a low-FODMAP (fermentable oligosaccharides, disaccharides, monosaccharides, and polyols) diet for up to 6 weeks, or as told by your health care provider. FODMAPs are sugars that are hard for some people to digest. ? Eating more fiber. ? Eating medium-sized meals at the same times every  day.  Medicines. These may include: ? Fiber supplements, if you have constipation. ? Medicine to control diarrhea (antidiarrheal medicines). ? Medicine to help control muscle tightening (spasms) in your GI tract (antispasmodic medicines). ? Medicines to help with mental health conditions, such as antidepressants or tranquilizers.  Talk therapy or counseling.  Working with a diet and nutrition specialist (dietitian) to help create a food plan that is right for you.  Managing your stress. Follow these instructions at home: Eating and drinking  Eat a healthy diet.  Eat medium-sized meals at about the same time every day. Do not eat large meals.  Gradually eat more fiber-rich foods. These include whole grains, fruits, and vegetables. This may be especially helpful if you have IBS with constipation.  Eat a diet low in FODMAPs.  Drink enough fluid to keep your urine pale yellow.  Keep a journal of foods that seem to trigger symptoms.  Avoid foods and drinks that: ? Contain added sugar. ? Make your symptoms worse. Dairy products, caffeinated drinks, and carbonated drinks can make symptoms worse for some people. General instructions  Take over-the-counter and prescription medicines and supplements only as told by your health care provider.  Get enough exercise. Do at least 150 minutes of moderate-intensity exercise each week.  Manage your stress. Getting enough sleep and exercise can help you manage stress.  Keep all follow-up visits as told by your health care provider and therapist. This is important. Alcohol Use  Do not drink alcohol if: ? Your health care provider tells you not to drink. ? You are pregnant, may be pregnant, or are planning to become pregnant.  If you drink alcohol, limit how much you have: ? 0-1 drink a day for women. ? 0-2 drinks a day for men.  Be aware of how much alcohol is in your drink. In the [place] one drink equals one typical bottle of beer (12  oz), one-half glass of wine (5 oz), or one shot of hard liquor (1 oz). Contact a health care provider if you have:  Constant pain.  Weight loss.  Difficulty or pain when swallowing.  Diarrhea that gets worse. Get help right away if you have:  Severe abdominal pain.  Fever.  Diarrhea with symptoms of dehydration, such as dizziness or dry mouth.  Bright red blood in your stool.  Stool that is black and tarry.  Abdominal swelling.  Vomiting that does not stop.  Blood in your vomit. Summary  Irritable bowel syndrome (IBS) is not one specific disease. It is a group of symptoms that affects digestion.  Your intestines may become more sensitive and overreact to certain things. This may  be especially true when you eat certain foods or when you are under stress.  There is no cure for IBS, but treatment can help relieve symptoms. This information is not intended to replace advice given to you by your health care provider. Make sure you discuss any questions you have with your health care provider. Document Released: 04/04/2005 Document Revised: 03/28/2017 Document Reviewed: 03/28/2017 Elsevier Interactive Patient Education  2019 [employer].

## 2018-09-21 LAB — COMPLETE METABOLIC PANEL WITH GFR
AG Ratio: 2.1 (calc) (ref 1.0–2.5)
ALT: 22 U/L (ref 6–29)
AST: 24 U/L (ref 10–35)
Albumin: 4.6 g/dL (ref 3.6–5.1)
Alkaline phosphatase (APISO): 56 U/L (ref 37–153)
BUN: 17 mg/dL (ref 7–25)
CO2: 24 mmol/L (ref 20–32)
Calcium: 9.5 mg/dL (ref 8.6–10.4)
Chloride: 106 mmol/L (ref 98–110)
Creat: 0.7 mg/dL (ref 0.50–0.99)
GFR, Est African American: 108 mL/min/{1.73_m2} (ref >=60)
GFR, Est Non African American: 94 mL/min/{1.73_m2} (ref >=60)
Globulin: 2.2 g/dL (calc) (ref 1.9–3.7)
Glucose, Bld: 87 mg/dL (ref 65–99)
Potassium: 4.1 mmol/L (ref 3.5–5.3)
Sodium: 141 mmol/L (ref 135–146)
Total Bilirubin: 0.6 mg/dL (ref 0.2–1.2)
Total Protein: 6.8 g/dL (ref 6.1–8.1)

## 2018-09-21 LAB — LIPID PANEL
Cholesterol: 224 mg/dL — ABNORMAL HIGH (ref <200)
HDL: 61 mg/dL (ref >=50)
LDL Cholesterol (Calc): 131 mg/dL (calc) — ABNORMAL HIGH
Non-HDL Cholesterol (Calc): 163 mg/dL (calc) — ABNORMAL HIGH (ref <130)
Total CHOL/HDL Ratio: 3.7 (calc) (ref <5.0)
Triglycerides: 187 mg/dL — ABNORMAL HIGH (ref <150)

## 2018-09-21 LAB — CBC WITH DIFFERENTIAL/PLATELET
Absolute Monocytes: 412 cells/uL (ref 200–950)
Basophils Absolute: 58 cells/uL (ref 0–200)
Basophils Relative: 1 %
Eosinophils Absolute: 209 cells/uL (ref 15–500)
Eosinophils Relative: 3.6 %
HCT: 41.8 % (ref 35.0–45.0)
Hemoglobin: 14.7 g/dL (ref 11.7–15.5)
Lymphs Abs: 2221 cells/uL (ref 850–3900)
MCH: 32.3 pg (ref 27.0–33.0)
MCHC: 35.2 g/dL (ref 32.0–36.0)
MCV: 91.9 fL (ref 80.0–100.0)
MPV: 10.6 fL (ref 7.5–12.5)
Monocytes Relative: 7.1 %
Neutro Abs: 2900 cells/uL (ref 1500–7800)
Neutrophils Relative %: 50 %
Platelets: 261 10*3/uL (ref 140–400)
RBC: 4.55 10*6/uL (ref 3.80–5.10)
RDW: 12.1 % (ref 11.0–15.0)
Total Lymphocyte: 38.3 %
WBC: 5.8 10*3/uL (ref 3.8–10.8)

## 2018-09-21 LAB — URINALYSIS, ROUTINE W REFLEX MICROSCOPIC
Bilirubin Urine: NEGATIVE
Glucose, UA: NEGATIVE
Hgb urine dipstick: NEGATIVE
Ketones, ur: NEGATIVE
Leukocytes,Ua: NEGATIVE
Nitrite: NEGATIVE
Protein, ur: NEGATIVE
Specific Gravity, Urine: 1.01 (ref 1.001–1.03)
pH: 7 (ref 5.0–8.0)

## 2018-09-21 LAB — MAGNESIUM: Magnesium: 2.2 mg/dL (ref 1.5–2.5)

## 2018-09-21 LAB — URINE CULTURE
MICRO NUMBER:: 538007
SPECIMEN QUALITY:: ADEQUATE

## 2018-09-21 LAB — TSH: TSH: 1.75 mIU/L (ref 0.40–4.50)

## 2018-09-21 LAB — VITAMIN D 25 HYDROXY (VIT D DEFICIENCY, FRACTURES): Vit D, 25-Hydroxy: 38 ng/mL (ref 30–100)

## 2018-09-24 LAB — CBC WITH DIFFERENTIAL/PLATELET

## 2018-09-24 LAB — VITAMIN D 25 HYDROXY (VIT D DEFICIENCY, FRACTURES)

## 2018-09-24 LAB — TSH

## 2018-09-24 LAB — URINALYSIS, ROUTINE W REFLEX MICROSCOPIC

## 2018-09-24 LAB — URINE CULTURE

## 2018-09-24 LAB — LIPID PANEL

## 2018-09-24 LAB — COMPLETE METABOLIC PANEL WITH GFR

## 2018-10-08 ENCOUNTER — Telehealth: Payer: Self-pay | Admitting: *Deleted

## 2018-10-08 MED ORDER — LINACLOTIDE 290 MCG PO CAPS: 290.0000 ug | ORAL_CAPSULE | Freq: Every day | ORAL | 1 refills | Status: DC

## 2018-10-08 NOTE — Telephone Encounter (Signed)
Spoke with patient regarding Linzess RX.  She states, due to the cost of the medication, she will try to schedule an appointment with Dr Havery Moros again. She was advised to call back, if she needs assistant with the appointment.

## 2018-10-17 ENCOUNTER — Telehealth: Payer: Self-pay | Admitting: General Surgery

## 2018-10-17 NOTE — Telephone Encounter (Signed)
Covid-19 screening questions   Do you now or have you had a fever in the last 14 days? No  Do you have any respiratory symptoms of shortness of breath or cough now or in the last 14 days? No  Do you have any family members or close contacts with diagnosed or suspected Covid-19 in the past 14 days? No  Have you been tested for Covid-19 and found to be positive? No  Patient instructed to wear a mask to appt. The patient verbalized understanding

## 2018-10-18 ENCOUNTER — Ambulatory Visit: Payer: 59 | Admitting: Physician Assistant

## 2018-10-18 ENCOUNTER — Encounter: Payer: Self-pay | Admitting: Nurse Practitioner

## 2018-10-18 ENCOUNTER — Ambulatory Visit: Payer: 59 | Admitting: Nurse Practitioner

## 2018-10-18 VITALS — BP 116/78 | HR 80 | Temp 97.9°F | Ht 66.5 in | Wt 170.2 lb

## 2018-10-18 DIAGNOSIS — K5900 Constipation, unspecified: Secondary | ICD-10-CM

## 2018-10-18 NOTE — Patient Instructions (Signed)
If you are age 62 or older, your body mass index should be between 23-30. Your Body mass index is 27.07 kg/m. If this is out of the aforementioned range listed, please consider follow up with your Primary Care Provider.  If you are age 18 or younger, your body mass index should be between 19-25. Your Body mass index is 27.07 kg/m. If this is out of the aformentioned range listed, please consider follow up with your Primary Care Provider.   You have been given samples of Trulance - take once daily.  You have been given Suprep to purge bowels.  Follow instructions and drink lots of water.  Continue Miralax.  Call with an update in a few days.  Thank you for choosing me and [facility].   Tye Savoy, NP

## 2018-10-22 ENCOUNTER — Telehealth: Payer: Self-pay | Admitting: Nurse Practitioner

## 2018-10-22 ENCOUNTER — Encounter: Payer: Self-pay | Admitting: Nurse Practitioner

## 2018-10-22 NOTE — Progress Notes (Signed)
Chief Complaint:    Constipation   IMPRESSION and PLAN:    1. 63 yo female with severe constipation, slow transit. No BM in a week.. No N/V. Abdominal exam unremarkable. Failed Linzess -Purge bowels with bowel prep (sample given) -following above, start trial of Trulance daily (samples given).  -call in next several days with an update  2. Bloating. Tried to reassure her that bloating is almost always a benign process.  -First we need to treat constipation. If bloating persists then needs low gas diet.       HPI:     Patient is a 63 yo female known to Dr. Havery Moros for a history of GERD, colon polyps and diverticulosis (colonoscopy Sept 2019,  due for surveillanceS colonoscopy 2024.   She has a history of constipation and it has been really problematic over last few months. Hasn't had a BM in several days. She doesn't get any urge to defecate for days at a time. She stays hydrated, TSH is normal. No recent medication changes. She has been less active because Gym is closed during [place]. Miralax hasn't been helpful. She started Linzess several weeks ago but still goes days without a BM. She feels bloated and asking about getting an EGD to check on the small hiatal hernia diagnosed a few years back. . She has chronic right inguinal pain which we though was probably musculoskeletal.    Review of systems:     No chest pain, no SOB, no fevers, no urinary sx   Past Medical History:  Diagnosis Date  . Allergy   . Cataract   . Colon polyps   . Elevated cholesterol   . Endometriosis 02/02/2017  . Factor V Leiden ([place]) 02/02/2017  . GERD (gastroesophageal reflux disease)   . IBS (irritable bowel syndrome)   . Liver hemangioma 02/02/2017  . NASH (nonalcoholic steatohepatitis) 02/02/2017    Patient's surgical history, family medical history, social history, medications and allergies were all reviewed in Epic   Creatinine clearance cannot be calculated (Patient's most recent lab  result is older than the maximum 21 days allowed.)  Current Outpatient Medications  Medication Sig Dispense Refill  . Ascorbic Acid (VITAMIN C PO) Take 500-1,000 Units by mouth daily.    Marland Kitchen aspirin EC 81 MG tablet Take 81 mg by mouth. Takes twice a week    . b complex vitamins tablet Take 1 tablet by mouth daily.    Marland Kitchen BIOTIN PO Take 5,000 mcg by mouth daily.     Marland Kitchen CINNAMON PO Take 1 tablet by mouth as needed.     . Magnesium 400 MG TABS Take 1 tablet by mouth daily.     . polyethylene glycol (MIRALAX / GLYCOLAX) 17 g packet Take 17 g by mouth daily.    . Probiotic Product (ALIGN) 4 MG CAPS Take 1 capsule by mouth at bedtime.     . thiamine (VITAMIN B-1) 100 MG tablet Take 100 mg by mouth every other day.     Marland Kitchen ZINC-VITAMIN C PO Take by mouth daily.    . Cholecalciferol (VITAMIN D) 2000 units tablet Take 4,000 Units by mouth daily.     . cyclobenzaprine (FLEXERIL) 10 MG tablet Take 1 tablet (10 mg total) by mouth at bedtime as needed for muscle spasms. (Patient not taking: Reported on 10/18/2018) 90 tablet 0  . hydrochlorothiazide (HYDRODIURIL) 12.5 MG tablet Take 2 tablets (25 mg total) by mouth daily. (Patient not taking: Reported on 10/18/2018) 30  tablet 3   No current facility-administered medications for this visit.     Physical Exam:     BP 116/78   Pulse 80   Temp 97.9 F (36.6 C)   Ht 5' 6.5" (1.689 m)   Wt 170 lb 4 oz (77.2 kg)   BMI 27.07 kg/m   GENERAL:  Pleasant female in NAD PSYCH: : Cooperative, normal affect EENT:  conjunctiva pink, mucous membranes moist, neck supple without masses CARDIAC:  RRR,  no peripheral edema PULM: Normal respiratory effort, lungs CTA bilaterally, no wheezing ABDOMEN:  Nondistended, soft, nontender. No obvious masses, no hepatomegaly,  normal bowel sounds SKIN:  turgor, no lesions seen Musculoskeletal:  Normal muscle tone, normal strength NEURO: Alert and oriented x 3, no focal neurologic deficits   Samantha Mckinney , NP 10/22/2018, 2:01 PM

## 2018-10-22 NOTE — Telephone Encounter (Signed)
Calling with an update as instructed at her visit. She did have good results with purging using the colon prep solution. She began Trulance the following day. Dosing with Miralax in the evenings.  Daily bowel movements every day since Saturday.She feels "the pain I was telling Nevin Bloodgood about. It's in my right lower side towards the middle. I feel it when stools passes."  No bowel movement yet today. "Lots of gurgling."  She notes LE edema with indentions caused by her shoes and sandals. She has increased her water intake. She is interested in "the diet we were discussing to help me with my symptoms."

## 2018-10-23 NOTE — Progress Notes (Signed)
Agree with assessment and plan as outlined.  

## 2018-10-24 ENCOUNTER — Other Ambulatory Visit: Payer: Self-pay

## 2018-10-24 MED ORDER — TRULANCE 3 MG PO TABS: 1.0000 | ORAL_TABLET | Freq: Every day | ORAL | 6 refills | Status: DC

## 2018-10-24 NOTE — Telephone Encounter (Signed)
Patient called said that she has not heard back on the update.

## 2018-10-24 NOTE — Telephone Encounter (Signed)
Patient is advised. Diet emailed to her.

## 2018-10-24 NOTE — Telephone Encounter (Signed)
Samantha Mckinney, the right lower pain sounds like what she described to Dr. Havery Moros in the past.  He didn't feel it was GI related  I would like for her to continue the truLance, glad she had good results with the bowel prep. I do not know why she is having edema, should not be related to GI system.  As far as the diet we discussed, can you give her a low gas diet to follow?  This could help with bloating. Thanks

## 2018-11-01 ENCOUNTER — Telehealth: Payer: Self-pay

## 2018-11-01 NOTE — Telephone Encounter (Signed)
I called to do this and it has already been done and approved the customer rep said for a year.

## 2018-11-28 ENCOUNTER — Other Ambulatory Visit: Payer: Self-pay | Admitting: *Deleted

## 2018-11-28 DIAGNOSIS — Z20822 Contact with and (suspected) exposure to covid-19: Secondary | ICD-10-CM

## 2018-11-30 LAB — NOVEL CORONAVIRUS, NAA: SARS-CoV-2, NAA: NOT DETECTED

## 2018-12-17 ENCOUNTER — Telehealth: Payer: Self-pay | Admitting: Nurse Practitioner

## 2018-12-17 NOTE — Telephone Encounter (Signed)
On Trulance for constipation. She did well with this for about 2 weeks. She then began having liquid bowel movement within an hour of taking it. She would empty about 4 times before it would stop. She would have abdominal pain in her lower abdomen at the area of the scar from her hysterectomy. She decreased the Trulance to QOD. On the days off Trulance she had difficult bowel movements, but not liquid.  Basically she has develop daily low abdomen pain that is worse with Trulance.

## 2018-12-17 NOTE — Telephone Encounter (Signed)
Spoke with the patient. She is going to stop Trulance because she does feel it worsens the discomfort and also creates a new problem with the diarrhea.

## 2019-01-09 ENCOUNTER — Other Ambulatory Visit: Payer: Self-pay

## 2019-01-09 DIAGNOSIS — Z20822 Contact with and (suspected) exposure to covid-19: Secondary | ICD-10-CM

## 2019-01-11 LAB — NOVEL CORONAVIRUS, NAA: SARS-CoV-2, NAA: NOT DETECTED

## 2019-01-15 ENCOUNTER — Encounter: Payer: Self-pay | Admitting: Gynecology

## 2019-01-18 ENCOUNTER — Ambulatory Visit: Payer: 59 | Admitting: Obstetrics & Gynecology

## 2019-01-24 ENCOUNTER — Other Ambulatory Visit: Payer: Self-pay

## 2019-01-25 ENCOUNTER — Encounter: Payer: Self-pay | Admitting: Obstetrics & Gynecology

## 2019-01-25 ENCOUNTER — Ambulatory Visit: Payer: 59 | Admitting: Obstetrics & Gynecology

## 2019-01-25 VITALS — BP 124/84

## 2019-01-25 DIAGNOSIS — K5904 Chronic idiopathic constipation: Secondary | ICD-10-CM | POA: Diagnosis not present

## 2019-01-25 DIAGNOSIS — Z90710 Acquired absence of both cervix and uterus: Secondary | ICD-10-CM

## 2019-01-25 DIAGNOSIS — R1030 Lower abdominal pain, unspecified: Secondary | ICD-10-CM | POA: Diagnosis not present

## 2019-01-25 DIAGNOSIS — Z90722 Acquired absence of ovaries, bilateral: Secondary | ICD-10-CM

## 2019-01-25 DIAGNOSIS — Z9079 Acquired absence of other genital organ(s): Secondary | ICD-10-CM | POA: Diagnosis not present

## 2019-01-25 NOTE — Progress Notes (Signed)
    Samantha Mckinney 1955-05-21 [id number]        63 y.o.  G1P1L1  RP: Lower abdominal wall pain/tenderness x a few months  HPI: History of TAH/BSO for endometriosis at age 3.  Stopped hormone replacement therapy in 2012.  Lower abdominal wall pain and tenderness for a few months, just above the TAH/BSO scar.  Patient has seen her family doctor many times for her discomfort.  Complains of increased gas and constipation.  Not very physically active since [place] pandemic.  No change in discomfort with intercourse.  Urine normal.  No fever.   OB History  Gravida Para Term Preterm AB Living  1 1       1   SAB TAB Ectopic Multiple Live Births               # Outcome Date GA Lbr Len/2nd Weight Sex Delivery Anes PTL Lv  1 Para             Past medical history,surgical history, problem list, medications, allergies, family history and social history were all reviewed and documented in the EPIC chart.   Directed ROS with pertinent positives and negatives documented in the history of present illness/assessment and plan.  Exam:  Vitals:   01/25/19 1124  BP: 124/84   General appearance:  Normal  Abdomen: Soft, nondistended, no rebound.  Bowel sounds present.  Tender at the lower abdominal wall all across, just above the TAH/BSO scar.  No nodule or mass felt.  Gynecologic exam: Vulva normal.  Bimanual exam: Vaginal vault and vagina normal.  No mass felt at the vaginal vault.  Mildly tender on bimanual exam but more so from the abdominal hand, no pelvic mass felt.   Assessment/Plan:  63 y.o. G1P1   1. Abdominal wall pain in suprapubic region Rule out adhesions between bowels and lower abdominal wall.  Rule out lesion at the lower abdominal wall.  Endometriotic lesion unlikely in the context of longstanding postmenopausal status without hormone replacement therapy.  Will do an abdominal, pelvic and lower abdominal wall ultrasound.  Message sent to organize.  2. S/P total hysterectomy and BSO  (bilateral salpingo-oophorectomy)  3. Chronic idiopathic constipation Counseling done to improve bowel movements with increase water intake, increase fibers in nutrition, more specifically increase vegetables with fibers, prunes, grapes, beans.  Increase physical activity with a 1 hour walk every day.  Other orders - Turmeric (QC TUMERIC COMPLEX PO); Take by mouth.  Counseling on above issues and coordination of care more than 50% for 25 minutes.  Princess Bruins MD, 11:29 AM 01/25/2019

## 2019-01-25 NOTE — Patient Instructions (Signed)
1. Abdominal wall pain in suprapubic region Rule out adhesions between bowels and lower abdominal wall.  Rule out lesion at the lower abdominal wall.  Endometriotic lesion unlikely in the context of longstanding postmenopausal status without hormone replacement therapy.  Will do an abdominal, pelvic and lower abdominal wall ultrasound.  Message sent to organize.  2. S/P total hysterectomy and BSO (bilateral salpingo-oophorectomy)  3. Chronic idiopathic constipation Counseling done to improve bowel movements with increase water intake, increase fibers in nutrition, more specifically increase vegetables with fibers, prunes, grapes, beans.  Increase physical activity with a 1 hour walk every day.  Other orders - Turmeric (QC TUMERIC COMPLEX PO); Take by mouth.  Samantha Mckinney, it was a pleasure seeing you today!

## 2019-01-29 ENCOUNTER — Telehealth: Payer: Self-pay | Admitting: *Deleted

## 2019-01-29 DIAGNOSIS — R1030 Lower abdominal pain, unspecified: Secondary | ICD-10-CM

## 2019-01-29 NOTE — Telephone Encounter (Signed)
Sent Dr.Lavoie a message regarding ultrasound,after speaking with ultrasound tech at [facility] suggested CT scan.

## 2019-01-29 NOTE — Telephone Encounter (Signed)
-----   Message from Princess Bruins, MD sent at 01/25/2019 11:46 AM EDT ----- Regarding: Schedule an Abdomen/Pelvic and lower abdominal wall [place] Lower abdominal wall pain.  S/P TAH/BSO many years ago for Endometriosis.  Increase bowel gas/constipation.  ? Bowel adhesions to the lower abdominal wall?  Lower abdominal wall lesion?  Patient would like a Friday appointment.

## 2019-01-31 ENCOUNTER — Encounter: Payer: Self-pay | Admitting: *Deleted

## 2019-01-31 NOTE — Telephone Encounter (Signed)
Per [facility] staff message she would like to proceed with [place] to assess the bowels. Order placed at [facility].

## 2019-02-04 ENCOUNTER — Other Ambulatory Visit: Payer: Self-pay | Admitting: Physician Assistant

## 2019-02-04 DIAGNOSIS — Z1231 Encounter for screening mammogram for malignant neoplasm of breast: Secondary | ICD-10-CM

## 2019-02-07 NOTE — Telephone Encounter (Signed)
patient scheduled on 02/15/19

## 2019-02-15 ENCOUNTER — Ambulatory Visit
Admission: RE | Admit: 2019-02-15 | Discharge: 2019-02-15 | Disposition: A | Discharge status: Home / Self Care | Payer: 59 | Source: Ambulatory Visit | Attending: Obstetrics & Gynecology | Admitting: Obstetrics & Gynecology

## 2019-02-15 DIAGNOSIS — R1030 Lower abdominal pain, unspecified: Secondary | ICD-10-CM

## 2019-02-15 IMAGING — US US ABDOMEN COMPLETE
1 series · 13 of 25 positions shown · non-contrast
Comparison: None.

CLINICAL DATA: Lower abdominal pain.

EXAM:
ABDOMEN ULTRASOUND COMPLETE

[Series 1: us abdomen complete · 0.23mm/px · 95 acquisitions, 13 frames shown]
[im 1/95]
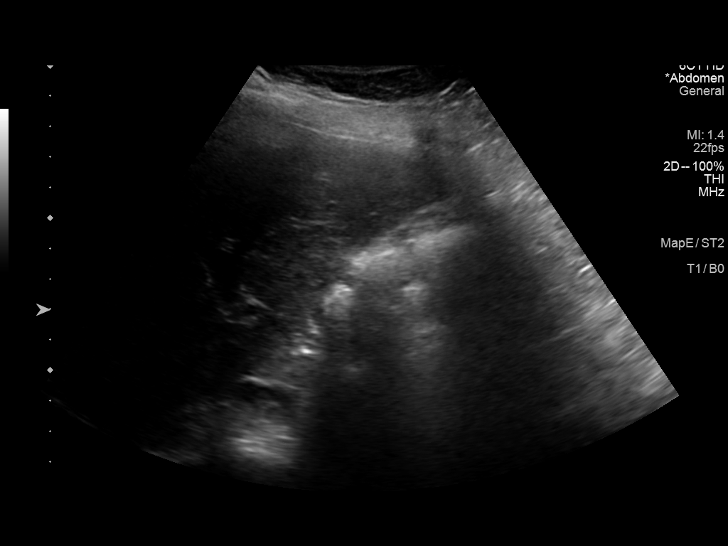
[im 8/95]
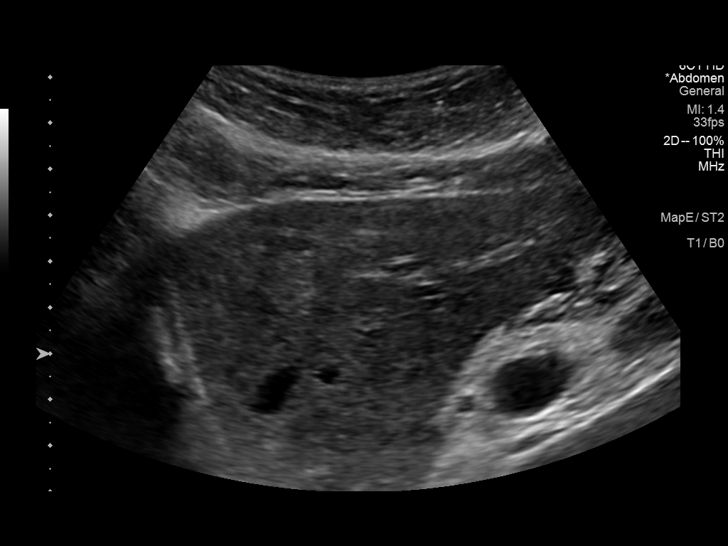
[im 16/95]
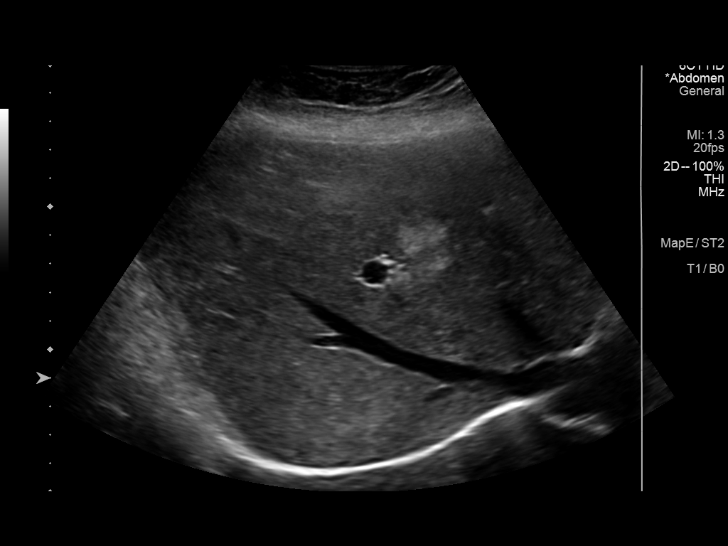
[im 24/95]
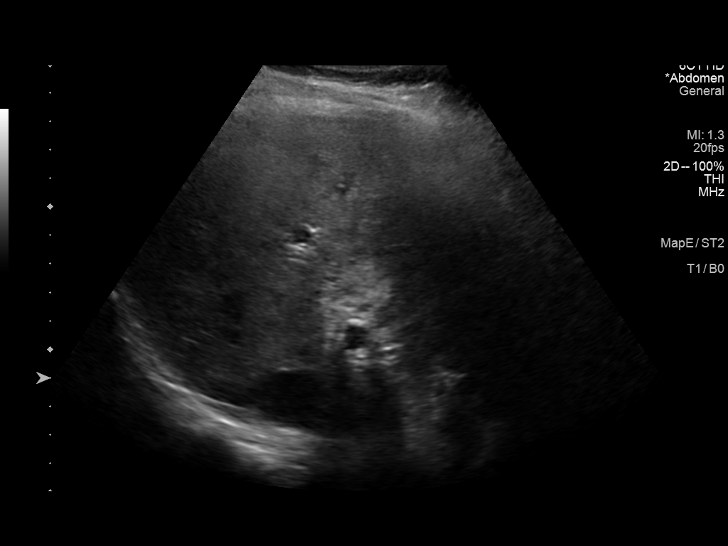
[im 32/95]
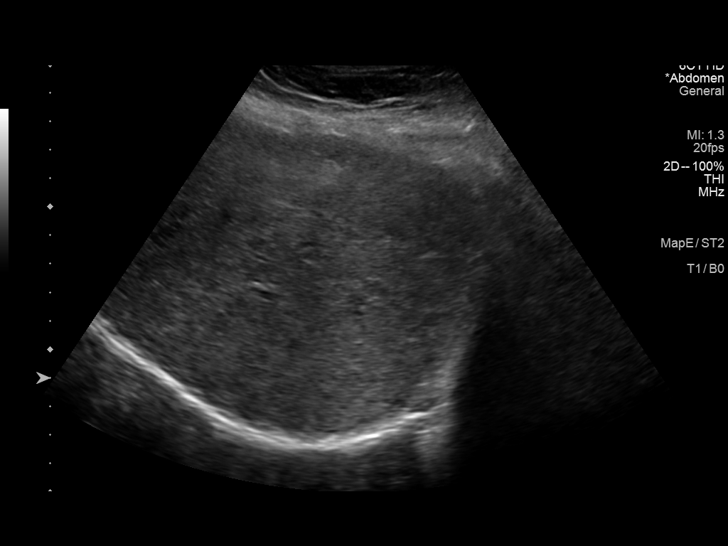
[im 40/95]
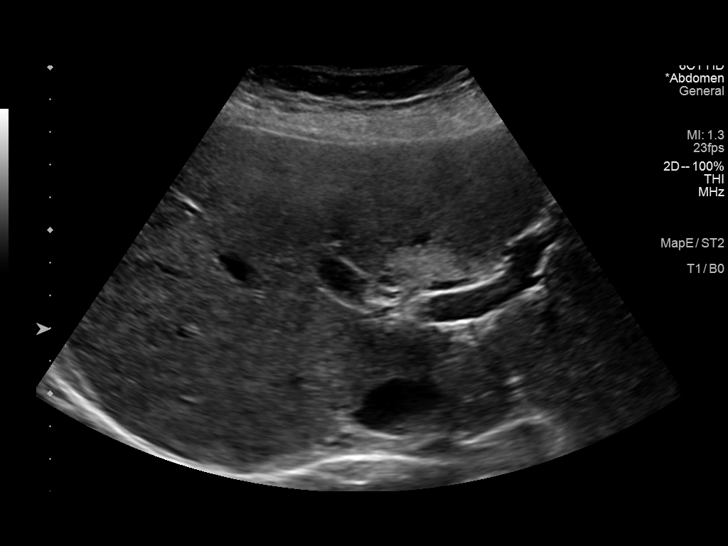
[im 48/95]
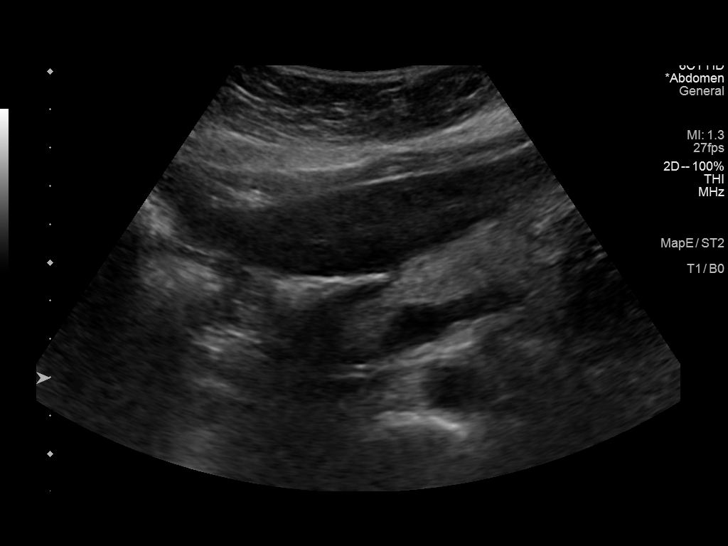
[im 55/95]
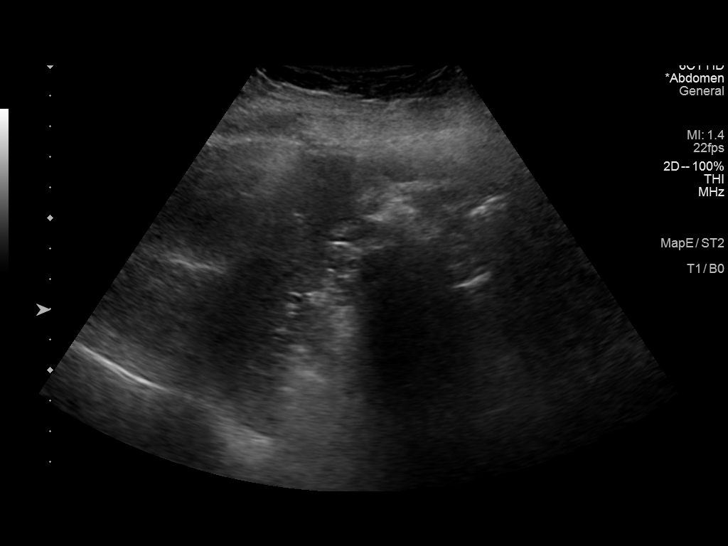
[im 63/95]
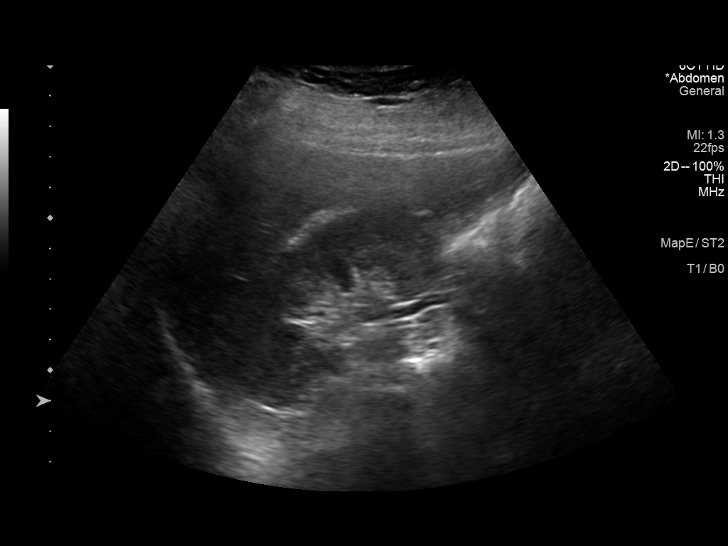
[im 71/95]
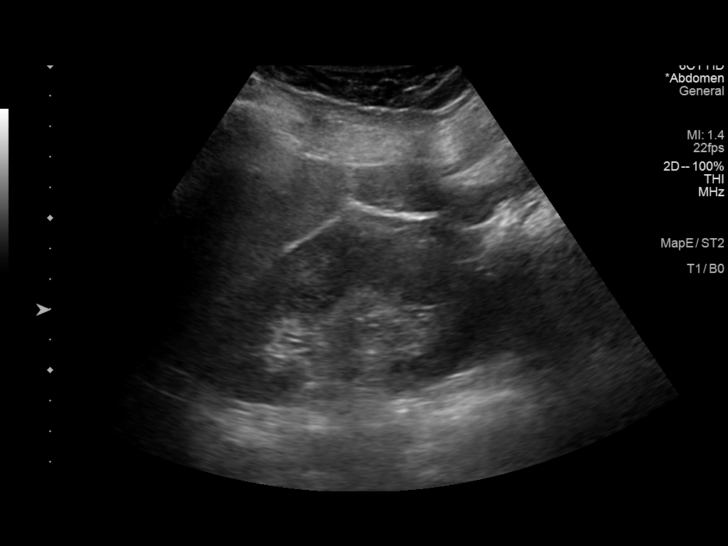
[im 79/95]
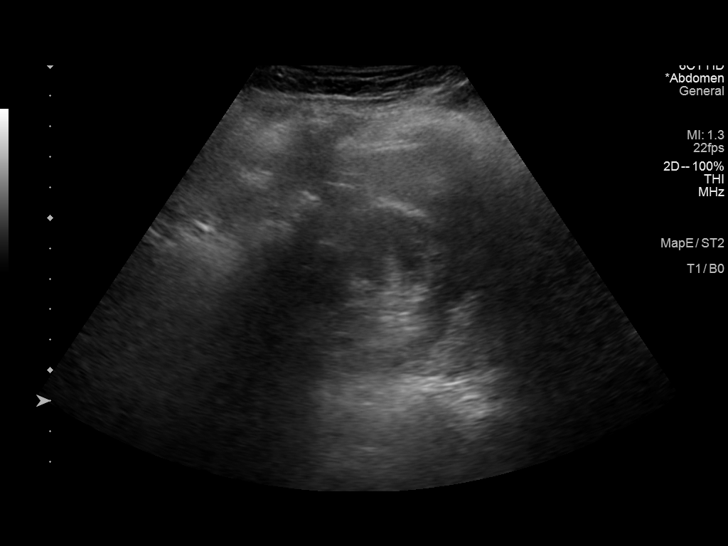
[im 87/95]
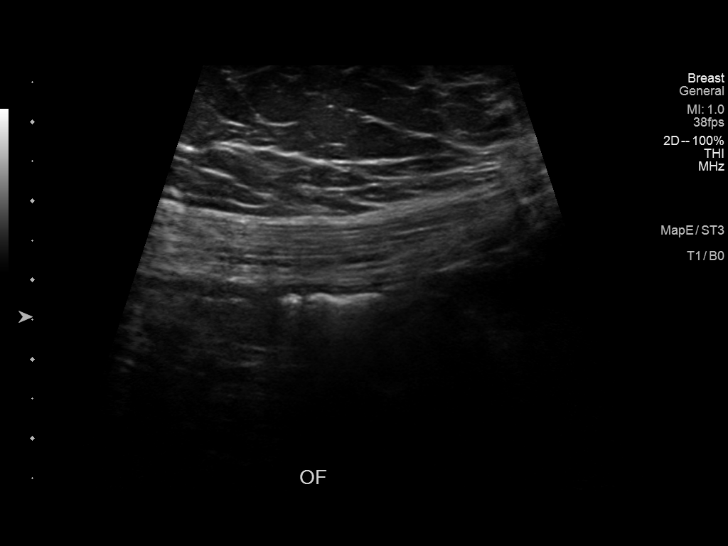
[im 95/95]
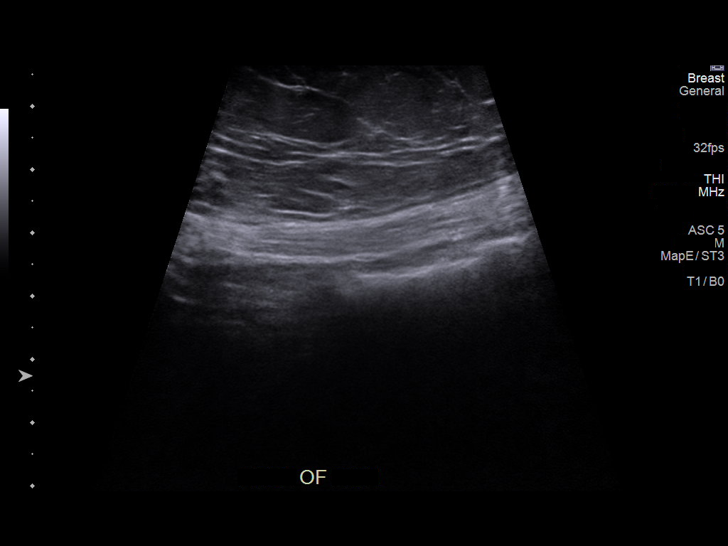

[13 of 25 positions shown; findings below may reference images not displayed]

FINDINGS: Gallbladder: Surgically absent.

Common bile duct: Diameter: 5 mm

Liver: 1.4 cm hyperechoic lesion in the central left liver. There is
another 2 point 2 cm hyperechoic lesion in the liver near the porta
hepatis. Portal vein is patent on color Doppler imaging with normal
direction of blood flow towards the liver.

IVC: No abnormality visualized.

Pancreas: Visualized portion unremarkable.

Spleen: Size and appearance within normal limits.

Right Kidney: Length: 9.5 cm. Echogenicity within normal limits. No
mass or hydronephrosis visualized.

Left Kidney: Length: 10.9 cm. Echogenicity within normal limits. No
mass or hydronephrosis visualized.

Abdominal aorta: No aneurysm visualized.

Other findings: Patient identified the area of concern which is in
the lower anterior abdominal wall near the prior surgical incision
for hysterectomy. Focused ultrasound exam in this region shows no
focal mass lesion within the abdominal wall. Bowel loops deep to the
anterior abdominal wall are poorly visualized by ultrasound.
IMPRESSION: 1. 2 hyperechoic liver lesions, potentially representing
hemangiomas. MRI of the abdomen without and with contrast
recommended to confirm.
2. No evidence for anterior abdominal wall mass lesion in the region
of patient concern.

## 2019-03-04 ENCOUNTER — Encounter: Payer: Self-pay | Admitting: Physician Assistant

## 2019-03-13 ENCOUNTER — Telehealth: Payer: Self-pay | Admitting: Obstetrics & Gynecology

## 2019-03-13 NOTE — Telephone Encounter (Signed)
Called patient 11/25 and discussed the US findings not showing any mass/bowels fixed at the incision scar location.  Severe discomfort associated with constipation/diarrhea.  Patient is scheduling a visit with her Gastro-Enterologist.

## 2019-03-27 ENCOUNTER — Ambulatory Visit
Admission: RE | Admit: 2019-03-27 | Discharge: 2019-03-27 | Disposition: A | Discharge status: Home / Self Care | Payer: 59 | Source: Ambulatory Visit | Attending: Physician Assistant | Admitting: Physician Assistant

## 2019-03-27 ENCOUNTER — Other Ambulatory Visit: Payer: Self-pay

## 2019-03-27 DIAGNOSIS — Z1231 Encounter for screening mammogram for malignant neoplasm of breast: Secondary | ICD-10-CM

## 2019-03-27 IMAGING — MG DIGITAL SCREENING BILAT W/ TOMO W/ CAD
6 of 10 series · 6 of 30 positions shown · non-contrast
Comparison: Previous exam(s).

CLINICAL DATA: Screening.

EXAM:
DIGITAL SCREENING BILATERAL MAMMOGRAM WITH TOMO AND CAD

[R MLO synth-2D (1 of 2)]
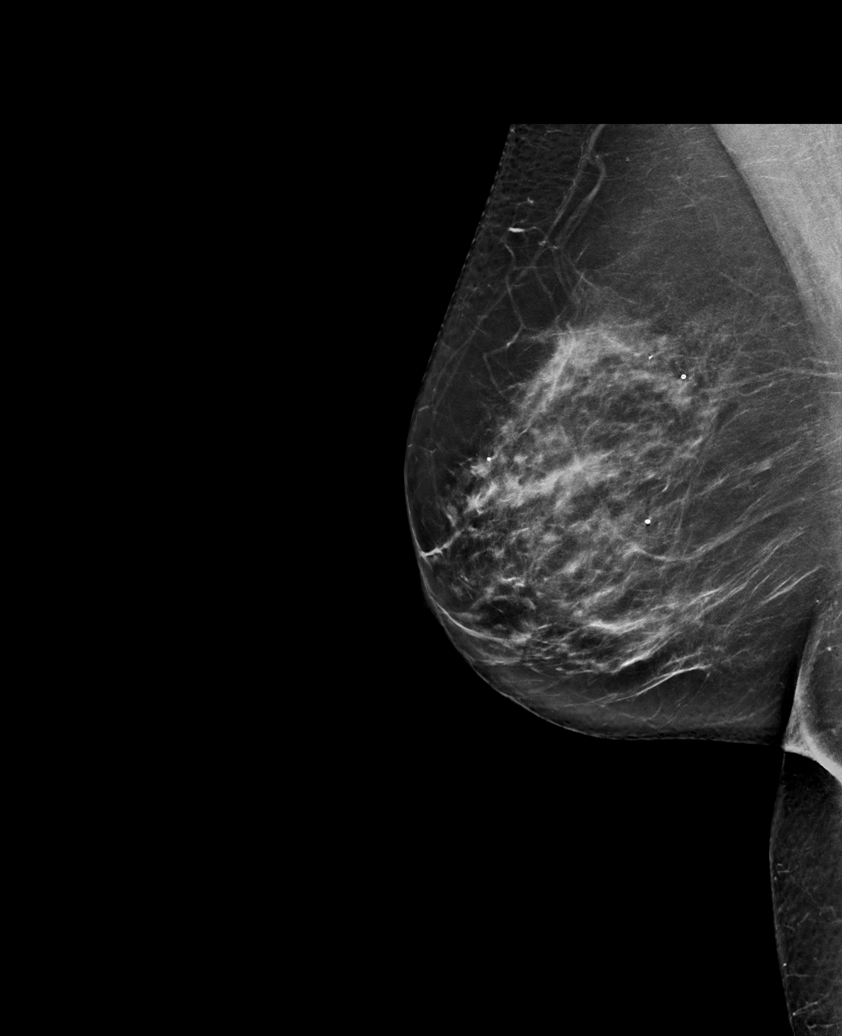

[R MLO synth-2D (2 of 2)]
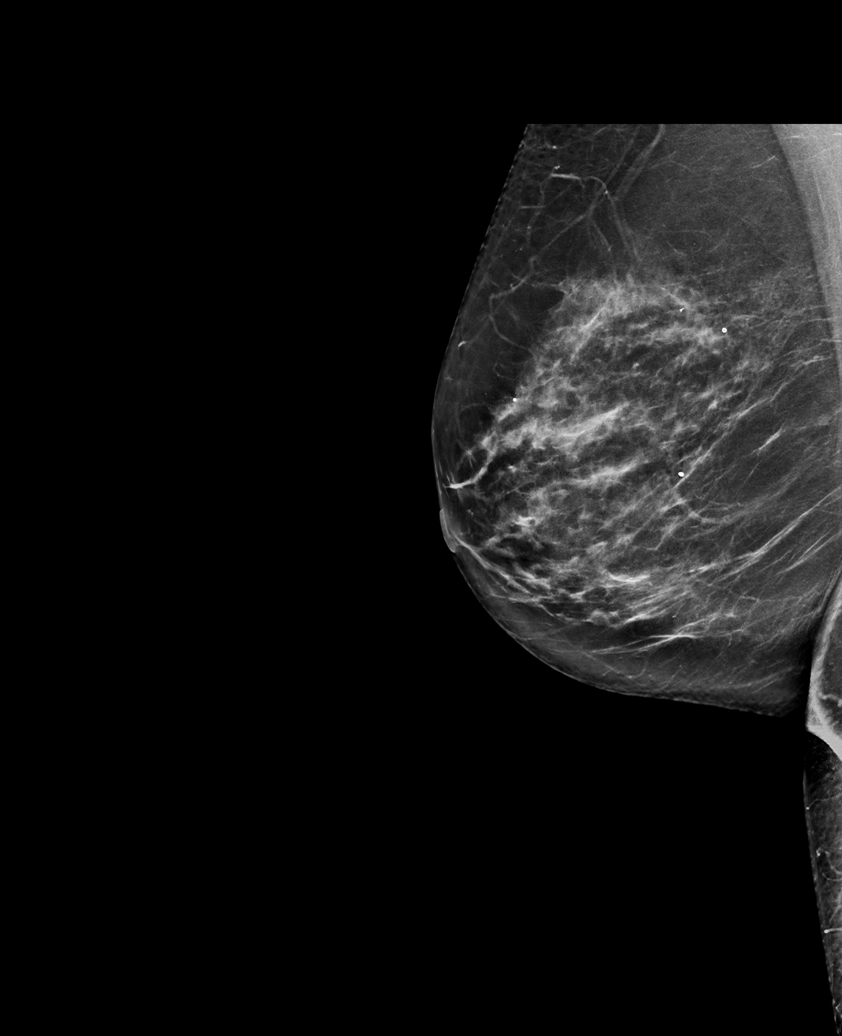

[L CC synth-2D]
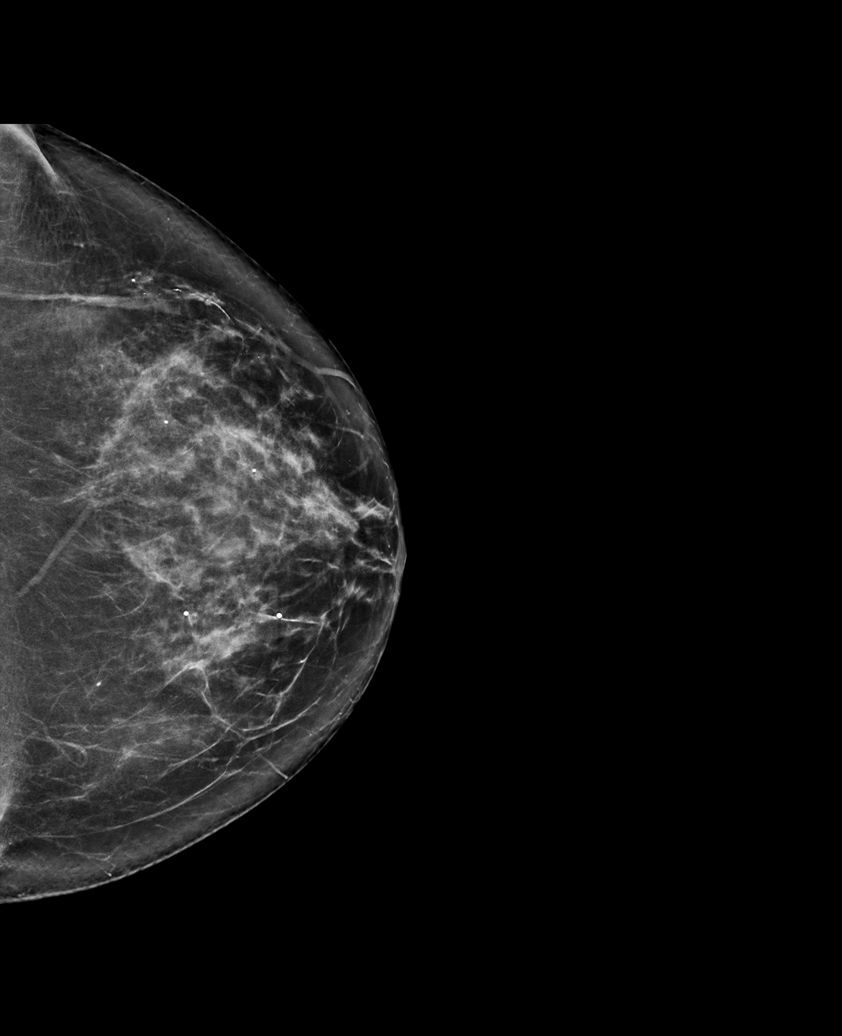

[R CC synth-2D]
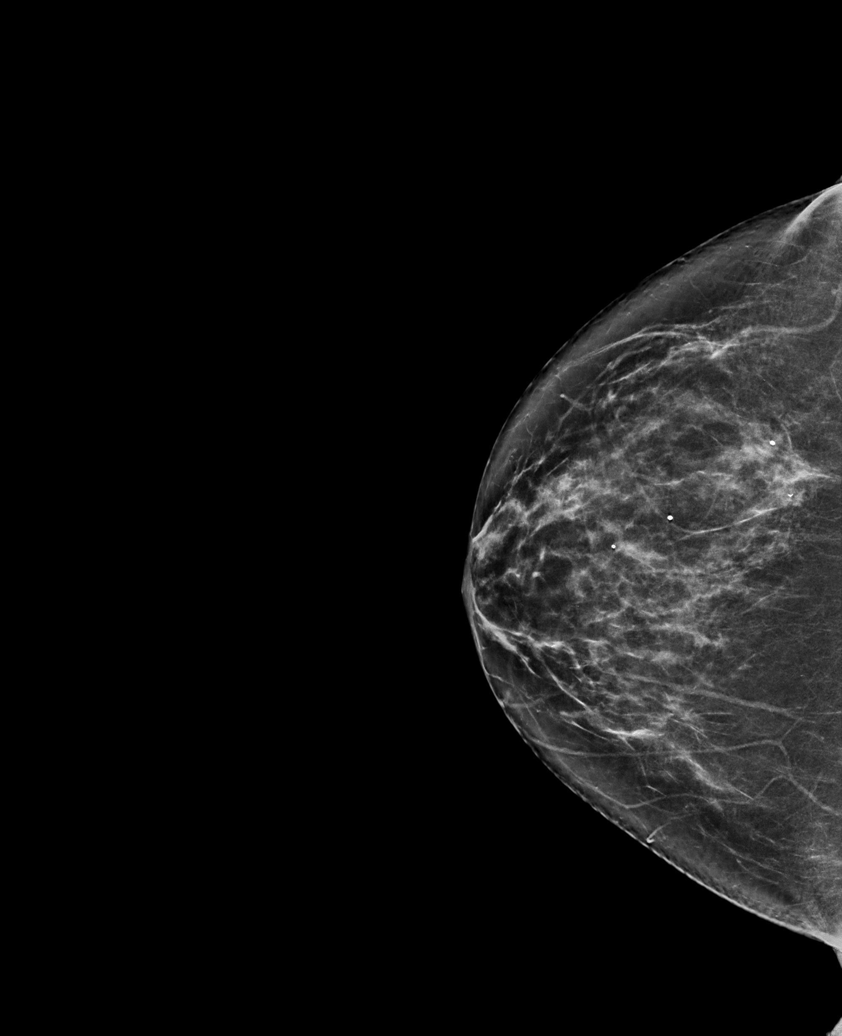

[L MLO synth-2D]
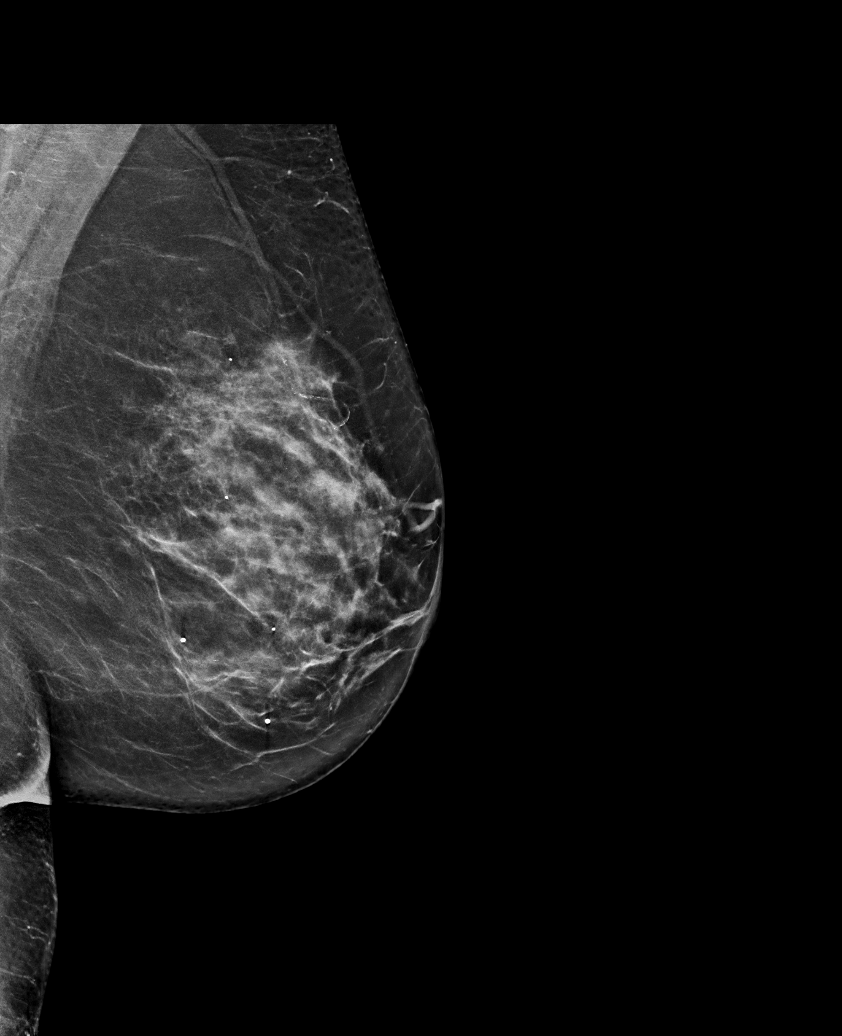

[R CC tomo · tomo slice 37/74.0]
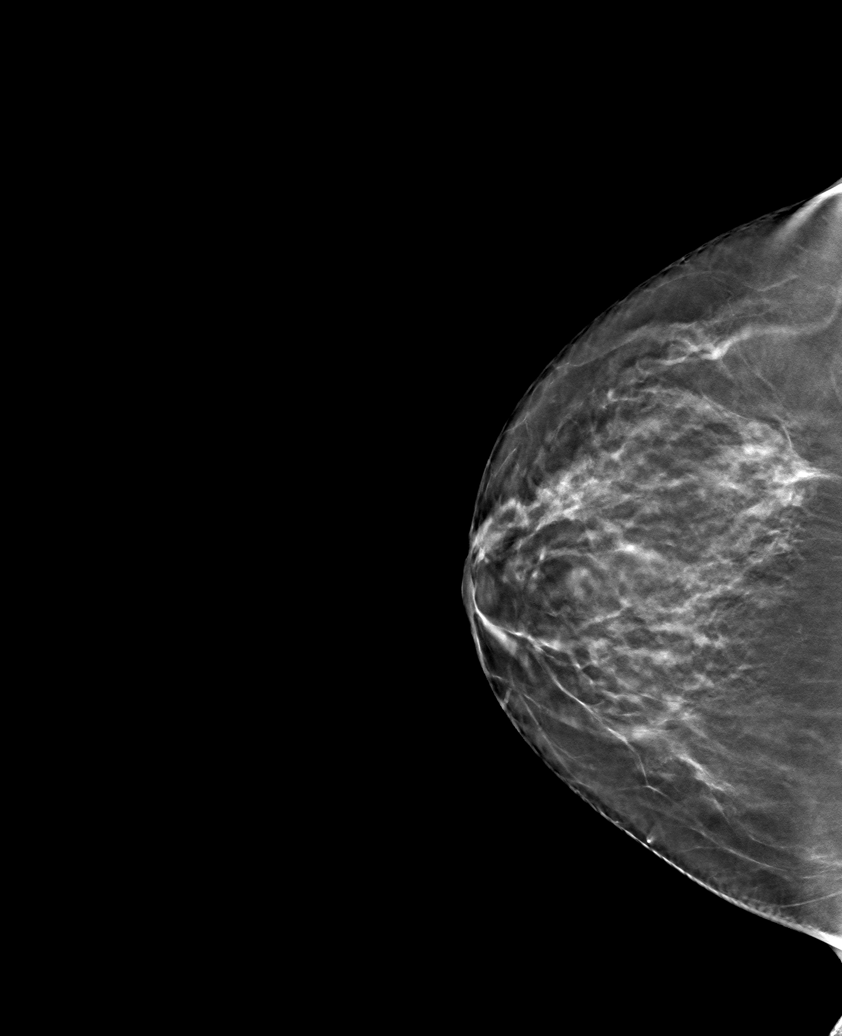

[6 of 30 positions shown; findings below may reference images not displayed]

ACR Breast Density Category c: The breast tissue is heterogeneously
dense, which may obscure small masses.
FINDINGS: There are no findings suspicious for malignancy. Images were
processed with CAD.
IMPRESSION: No mammographic evidence of malignancy. A result letter of this
screening mammogram will be mailed directly to the patient.

RECOMMENDATION:
Screening mammogram in one year. (Code:[5V])

BI-RADS CATEGORY  1: Negative.

## 2019-06-07 ENCOUNTER — Encounter: Payer: 59 | Admitting: Obstetrics & Gynecology

## 2019-06-13 NOTE — Progress Notes (Deleted)
Assessment and Plan:  There are no diagnoses linked to this encounter.    Further disposition pending results of labs. Discussed med's effects and SE's.   Over 30 minutes of exam, counseling, chart review, and critical decision making was performed.   Future Appointments  Date Time Provider [place]  06/17/2019 11:30 AM Liane Comber, NP [facility] None  08/08/2019  3:00 PM Vicie Mutters, PA-C [facility] None  03/04/2020  3:00 PM Vicie Mutters, PA-C [facility] None    ------------------------------------------------------------------------------------------------------------------   HPI There were no vitals taken for this visit.  64 y.o.female presents for ***  Requesting cortisone injection   Past Medical History:  Diagnosis Date  . Allergy   . Cataract   . Colon polyps   . Elevated cholesterol   . Endometriosis 02/02/2017  . Factor V Leiden ([place]) 02/02/2017  . GERD (gastroesophageal reflux disease)   . IBS (irritable bowel syndrome)   . Liver hemangioma 02/02/2017  . NASH (nonalcoholic steatohepatitis) 02/02/2017     Allergies  Allergen Reactions  . Morphine And Related Swelling    Current Outpatient Medications on File Prior to Visit  Medication Sig  . Ascorbic Acid (VITAMIN C PO) Take 500-1,000 Units by mouth daily.  Marland Kitchen aspirin EC 81 MG tablet Take 81 mg by mouth. Takes twice a week  . b complex vitamins tablet Take 1 tablet by mouth daily.  Marland Kitchen BIOTIN PO Take 5,000 mcg by mouth daily.   . Cholecalciferol (VITAMIN D) 2000 units tablet Take 4,000 Units by mouth daily.   Marland Kitchen CINNAMON PO Take 1 tablet by mouth as needed.   . cyclobenzaprine (FLEXERIL) 10 MG tablet Take 1 tablet (10 mg total) by mouth at bedtime as needed for muscle spasms.  . hydrochlorothiazide (HYDRODIURIL) 12.5 MG tablet Take 2 tablets (25 mg total) by mouth daily. (Patient not taking: Reported on 10/18/2018)  . Magnesium 400 MG TABS Take 1 tablet by mouth daily.   Marland Kitchen Plecanatide  (TRULANCE) 3 MG TABS Take 1 tablet by mouth daily.  . polyethylene glycol (MIRALAX / GLYCOLAX) 17 g packet Take 17 g by mouth daily.  . Probiotic Product (ALIGN) 4 MG CAPS Take 1 capsule by mouth at bedtime.   . thiamine (VITAMIN B-1) 100 MG tablet Take 100 mg by mouth every other day.   . Turmeric (QC TUMERIC COMPLEX PO) Take by mouth.  Marland Kitchen ZINC-VITAMIN C PO Take by mouth daily.   No current facility-administered medications on file prior to visit.    ROS: all negative except above.   Physical Exam:  There were no vitals taken for this visit.  General Appearance: Well nourished, in no apparent distress. Eyes: PERRLA, EOMs, conjunctiva no swelling or erythema Sinuses: No Frontal/maxillary tenderness ENT/Mouth: Ext aud canals clear, TMs without erythema, bulging. No erythema, swelling, or exudate on post pharynx.  Tonsils not swollen or erythematous. Hearing normal.  Neck: Supple, thyroid normal.  Respiratory: Respiratory effort normal, BS equal bilaterally without rales, rhonchi, wheezing or stridor.  Cardio: RRR with no MRGs. Brisk peripheral pulses without edema.  Abdomen: Soft, + BS.  Non tender, no guarding, rebound, hernias, masses. Lymphatics: Non tender without lymphadenopathy.  Musculoskeletal: Full ROM, 5/5 strength, normal gait.  Skin: Warm, dry without rashes, lesions, ecchymosis.  Neuro: Cranial nerves intact. Normal muscle tone, no cerebellar symptoms. Sensation intact.  Psych: Awake and oriented X 3, normal affect, Insight and Judgment appropriate.     Izora Ribas, NP 12:41 PM [facility]

## 2019-06-17 ENCOUNTER — Ambulatory Visit: Payer: 59 | Admitting: Adult Health

## 2019-07-01 ENCOUNTER — Encounter: Payer: Self-pay | Admitting: Physician Assistant

## 2019-07-01 ENCOUNTER — Other Ambulatory Visit: Payer: Self-pay

## 2019-07-01 ENCOUNTER — Ambulatory Visit: Payer: 59 | Admitting: Physician Assistant

## 2019-07-01 VITALS — BP 112/80 | HR 68 | Temp 97.5°F | Wt 180.0 lb

## 2019-07-01 DIAGNOSIS — M5442 Lumbago with sciatica, left side: Secondary | ICD-10-CM

## 2019-07-01 DIAGNOSIS — M7062 Trochanteric bursitis, left hip: Secondary | ICD-10-CM

## 2019-07-01 DIAGNOSIS — R1032 Left lower quadrant pain: Secondary | ICD-10-CM

## 2019-07-01 DIAGNOSIS — R609 Edema, unspecified: Secondary | ICD-10-CM | POA: Diagnosis not present

## 2019-07-01 MED ORDER — DEXAMETHASONE SODIUM PHOSPHATE 100 MG/10ML IJ SOLN
10.0000 mg | Freq: Once | INTRAMUSCULAR | Status: AC
  Administered 2019-07-01: 10 mg via INTRAMUSCULAR

## 2019-07-01 MED ORDER — HYDROCHLOROTHIAZIDE 12.5 MG PO TABS: ORAL_TABLET | ORAL | 0 refills | Status: DC

## 2019-07-01 NOTE — Progress Notes (Signed)
Subjective:    Patient ID: Samantha Mckinney, female    DOB: 1955-07-28, 64 y.o.   MRN: [id number]  HPI 64 y.o. WF with history of back surgery L4/L5 in 2000 and FM presents with left hip pain x 6 weeks. [facility] and [facility]ractor, that was not helping and now she is doing [facility] therapy who said she has a lot of "systemic" inflammation, she complains of swelling in her hands, redness in her hands. Admits to not eating well. Very frustrated that she can not work out right.   She started to work out on treadmill and started to have back pain. Left lower back, then at her lateral left hip, comes around and down her leg into her foot.  She has lower back pain. Left hip pain worse with lying down on that hip at night.  Patient denies fever, hematuria, incontinence, numbness, tingling, weakness and saddle anesthesia She states she has bilateral leg/arm tingling/burning pain. Has history of FM  Patient has long standing history of AB pain, constipation, back pain, normal colonoscopy last year. Had CT AB 2018 that was normal. Has never had pelvic floor PT.   Son got married in [place] in [place], does not have a good relationship with her daughter in law and she is very stressed. She is tearful right now. Admits to not eating well. Bought a house in [place] and she is very stressed.   BMI is Body mass index is 28.62 kg/m., she is working on diet and exercise. Wt Readings from Last 3 Encounters:  07/01/19 180 lb (62 kg)  10/18/18 170 lb 4 oz (62 kg)  09/20/18 172 lb (62 kg)     Blood pressure 112/80, pulse 68, temperature (!) 97.5 F (36.4 C), weight 180 lb (81.6 kg), SpO2 98 %.  Medications   Current Outpatient Medications (Cardiovascular):  .  hydrochlorothiazide (HYDRODIURIL) 12.5 MG tablet, Take 2 tablets (25 mg total) by mouth daily. (Patient not taking: Reported on 10/18/2018)   Current Outpatient Medications (Analgesics):  .  aspirin EC 81 MG tablet, Take 81 mg by  mouth. Takes twice a week   Current Outpatient Medications (Other):  Marland Kitchen  Ascorbic Acid (VITAMIN C PO), Take 500-1,000 Units by mouth daily. Marland Kitchen  b complex vitamins tablet, Take 1 tablet by mouth daily. Marland Kitchen  BIOTIN PO, Take 5,000 mcg by mouth daily.  .  Cholecalciferol (VITAMIN D) 2000 units tablet, Take 4,000 Units by mouth daily.  Marland Kitchen  CINNAMON PO, Take 1 tablet by mouth as needed.  .  cyclobenzaprine (FLEXERIL) 10 MG tablet, Take 1 tablet (10 mg total) by mouth at bedtime as needed for muscle spasms. .  Magnesium 400 MG TABS, Take 1 tablet by mouth daily.  Marland Kitchen  Plecanatide (TRULANCE) 3 MG TABS, Take 1 tablet by mouth daily. .  polyethylene glycol (MIRALAX / GLYCOLAX) 17 g packet, Take 17 g by mouth daily. .  Probiotic Product (ALIGN) 4 MG CAPS, Take 1 capsule by mouth at bedtime.  .  thiamine (VITAMIN B-1) 100 MG tablet, Take 100 mg by mouth every other day.  .  Turmeric (QC TUMERIC COMPLEX PO), Take by mouth. Marland Kitchen  ZINC-VITAMIN C PO, Take by mouth daily.  Problem list She has Lung nodule; Factor V Leiden ([place]); Mixed hyperlipidemia; Elevated homocysteine; Vitamin D deficiency; Endometriosis; Liver hemangioma; Hiatal hernia; CKD (chronic kidney disease); and NASH (nonalcoholic steatohepatitis) on their problem list.  Past Surgical History:  Procedure Laterality Date  . ABDOMINAL  HYSTERECTOMY  1998   TOTAL, on estrogen  until 2012  . BACK SURGERY  2000   disc removed L4L5  . CHOLECYSTECTOMY    . ESOPHAGOGASTRODUODENOSCOPY  2014   In [place]  . HEMORRHOID SURGERY  1996  . LAPAROSCOPIC ENDOMETRIOSIS FULGURATION     x 6, 1980-1991    Review of Systems See HPI    Objective:   Physical Exam Constitutional:      Appearance: She is well-developed.  HENT:     Head: Normocephalic and atraumatic.  Eyes:     Conjunctiva/sclera: Conjunctivae normal.     Pupils: Pupils are equal, round, and reactive to light.  Cardiovascular:     Rate and Rhythm: Normal rate and regular rhythm.  Pulmonary:      Effort: Pulmonary effort is normal.     Breath sounds: Normal breath sounds.  Abdominal:     General: Bowel sounds are normal.     Palpations: Abdomen is soft.     Tenderness: There is no abdominal tenderness.  Musculoskeletal:     Cervical back: Normal range of motion and neck supple.     Comments: Patient is able to ambulate well. Gait is  Antalgic. Straight leg raising with dorsiflexion positive left leg for radicular symptoms. Sensory exam in the legs are normal. Knee reflexes are normal Ankle reflexes are normal Strength is normal and symmetric in arms and legs. There is SI tenderness to palpation.  There isparaspinal muscle spasm.  There is not midline tenderness.  ROM of spine with  limited in all spheres due to pain.  Left hip: positives: tenderness over greater trochanter, FROM.  Lymphadenopathy:     Cervical: No cervical adenopathy.  Skin:    General: Skin is warm and dry.     Findings: No rash.  Neurological:     Mental Status: She is alert and oriented to person, place, and time.     Deep Tendon Reflexes: Reflexes are normal and symmetric.        Assessment & Plan:  Samantha Mckinney was seen today for hip pain and back pain.  Diagnoses and all orders for this visit:  Left lower quadrant abdominal pain -     Ambulatory referral to Physical Therapy - ? AB pain, constipation and low back pain may be coming from pelvic floor, will refer for evaluation.   Acute left-sided low back pain with left-sided sciatica -     Ambulatory referral to Orthopedics -     Ambulatory referral to Physical Therapy - patient has had surgery previously, has done PT out patient, weakly positive straight leg test- will refer to ortho.   Left Hip Bursitis-  Injection- area cleaned with alcohol, Dexamethasone 62m and 1 CC lidocaine injected into greater trochanteric, tolerated welll with immediate relief.  Mobic, RICE, and exercises given  Swelling -     hydrochlorothiazide (HYDRODIURIL) 12.5 MG  tablet; Take 1-2 tablets once daily as needed for swelling - will do AS needed Counseled on effect on kidney function Increase water/decrease sugars.   The patient was advised to call immediately if she has any concerning symptoms in the interval. The patient voices understanding of current treatment options and is in agreement with the current care plan.The patient knows to call the clinic with any problems, questions or concerns or go to the ER if any further progression of symptoms.  Has close follow up 1 month

## 2019-07-16 ENCOUNTER — Encounter: Payer: Self-pay | Admitting: Orthopaedic Surgery

## 2019-07-16 ENCOUNTER — Ambulatory Visit: Payer: 59 | Admitting: Orthopaedic Surgery

## 2019-07-16 ENCOUNTER — Other Ambulatory Visit: Payer: Self-pay

## 2019-07-16 DIAGNOSIS — M545 Low back pain, unspecified: Secondary | ICD-10-CM | POA: Insufficient documentation

## 2019-07-16 NOTE — Progress Notes (Signed)
Office Visit Note   Patient: Samantha Mckinney           Date of Birth: 1955/12/06           MRN: [id number] Visit Date: 07/16/2019              Requested by: Vicie Mutters, PA-C [address] [place] [id number] PCP: Unk Pinto, MD   Assessment & Plan: Visit Diagnoses:  1. Low back pain, unspecified back pain laterality, unspecified chronicity, unspecified whether sciatica present     Plan: Patient has physical therapy scheduled.  We will recheck her in 4 weeks if she is not getting better on return visit we will obtain lateral flexion-extension lumbar x-rays as well as AP x-ray lumbar spine and AP lateral cervical spine x-rays.  If her back pain and radicular symptoms on the left or not improved an MRI with and without contrast lumbar spine will be considered.  Follow-Up Instructions: Return in about 4 weeks (around 08/13/2019).   Orders:  No orders of the defined types were placed in this encounter.  No orders of the defined types were placed in this encounter.     Procedures: No procedures performed   Clinical Data: No additional findings.   Subjective: Chief Complaint  Patient presents with  . Neck - Pain  . Lower Back - Pain  . Spine - Pain    HPI 64 year old female new patient visit with low back pain.  She has had a trochanteric injection recently on 07/01/2019 which gave her 3 days improvement and then recurrence.  She has burning sensation that radiates from her neck and her lower back feels like it is tingling down her spine.  She has noticed some limitation in range of motion of her cervical spine.  She has had trouble sleeping particularly when she lays on her left side or right side sometimes she can be supine with the pad behind her knees.  She has been to a chiropractor been through deep massage with [facility].  She has formal physical therapy scheduled at this point.  She has had numbness and tingling in her left leg occasional pain  in her right.  Previous surgery lumbar microdiscectomy in 2000 done in [place].  She states she did well after this and has had some off-and-on back pain but normally with a short course of therapy and activity modification her symptoms have resolved.  She states this time is been present for many weeks and not getting better.  Previous lumbar images 2019 showed multilevel degenerative changes.  No spondylolysis.  Facet changes and some demineralization of the lumbar spine suggestive.  Review of Systems positive history of kidney disease lung nodule hyperlipidemia vitamin D deficiency factor V Leiden , no acute bowel bladder symptoms.  Posterior neck and low back pain.   Objective: Vital Signs: BP 131/82   Pulse 75   Ht 5' 6"  (1.676 m)   Wt 180 lb (81.6 kg)   BMI 29.05 kg/m   Physical Exam Constitutional:      Appearance: She is well-developed.  HENT:     Head: Normocephalic.     Right Ear: External ear normal.     Left Ear: External ear normal.  Eyes:     Pupils: Pupils are equal, round, and reactive to light.  Neck:     Thyroid: No thyromegaly.     Trachea: No tracheal deviation.  Cardiovascular:     Rate and Rhythm: Normal rate.  Pulmonary:  Effort: Pulmonary effort is normal.  Abdominal:     Palpations: Abdomen is soft.  Skin:    General: Skin is warm and dry.  Neurological:     Mental Status: She is alert and oriented to person, place, and time.  Psychiatric:        Behavior: Behavior normal.     Ortho Exam patient is able to heel and toe walk.  She can forward flex fingertips to floor with her knee straight.  She has some sciatic notch tenderness bilateral trochanteric bursal tenderness worse on the left than right negative logroll to the hips knees reach full extension some pain with popliteal compression on the left negative on the right.  Well-healed lumbar incision at L4-5.  Specialty Comments:  No specialty comments available.  Imaging: No results  found.   PMFS History: Patient Active Problem List   Diagnosis Date Noted  . Low back pain 07/16/2019  . Lung nodule 02/02/2017  . Factor V Leiden ([place]) 02/02/2017  . Mixed hyperlipidemia 02/02/2017  . 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  . Vitamin D deficiency 02/02/2017  . Endometriosis 02/02/2017  . Liver hemangioma 02/02/2017  . Hiatal hernia 02/02/2017  . CKD (chronic kidney disease) 02/02/2017  . NASH (nonalcoholic steatohepatitis) 02/02/2017   Past Medical History:  Diagnosis Date  . Allergy   . Cataract   . Colon polyps   . Elevated cholesterol   . Endometriosis 02/02/2017  . Factor V Leiden ([place]) 02/02/2017  . GERD (gastroesophageal reflux disease)   . IBS (irritable bowel syndrome)   . Liver hemangioma 02/02/2017  . NASH (nonalcoholic steatohepatitis) 02/02/2017    Family History  Problem Relation Age of Onset  . Cirrhosis Mother   . Diabetes Mother   . Arthritis Mother   . Gout Mother   . Depression Mother   . Bowel Disease Mother   . Heart disease Father   . Diabetes Father   . Valvular heart disease Sister   . Depression Sister   . Diabetes Brother   . Hyperlipidemia Son   . Cancer Maternal Grandmother 69       colon cancer  . Colon cancer Maternal Grandmother   . Atrial fibrillation Brother   . Valvular heart disease Brother   . Hypertension Brother   . Esophageal cancer Neg Hx     Past Surgical History:  Procedure Laterality Date  . ABDOMINAL HYSTERECTOMY  1998   TOTAL, on estrogen  until 2012  . BACK SURGERY  2000   disc removed L4L5  . CHOLECYSTECTOMY    . ESOPHAGOGASTRODUODENOSCOPY  2014   In [place]  . HEMORRHOID SURGERY  1996  . LAPAROSCOPIC ENDOMETRIOSIS FULGURATION     x 6, 1980-1991   Social History   Occupational History  . Not on file  Tobacco Use  . Smoking status: Never Smoker  . Smokeless tobacco: Never Used  Substance and Sexual Activity  . Alcohol use: Yes    Comment: OCC   . Drug use: Never  . Sexual activity: Not  Currently    Partners: Male    Comment: 1st intercourse- 49, partners- 81, married- 107 yrs

## 2019-07-17 ENCOUNTER — Telehealth: Payer: Self-pay | Admitting: Orthopaedic Surgery

## 2019-07-17 NOTE — Telephone Encounter (Signed)
noted 

## 2019-07-17 NOTE — Telephone Encounter (Signed)
Patient called and stated that she was told she couldn't have a MRI until the insurance would approve in 4 weeks. Patient wants to know if she can have a xray instead.  Please call patient [phone]

## 2019-07-17 NOTE — Telephone Encounter (Signed)
Please advise. Per note, you were going to wait until follow up to obtain xrays. Patient would like to have those done now.

## 2019-07-17 NOTE — Telephone Encounter (Signed)
I called her. Plan is as stated in  office note. She was thinking the xrays would tell the therapist what needed to be fixed. Explained to her again , followup as planned. FYI

## 2019-07-25 NOTE — Progress Notes (Signed)
Subjective:    Patient ID: Samantha Mckinney, female    DOB: 12-05-1955, 64 y.o.   MRN: [id number]  Assessment & Plan:  AB pain, nausea, melena.  Patient was going to leave and do outpatient treatment however before she left, she has severe AB pain and had dark black/tarry stool.  She is crying and states she is not feeling well.  Will refer to ER to rule out blood loss anemia, dehydration, PUD, diverticulitis.  Due to severe pain, nausea and poor PO intake and now with melana will send to the ER for stat labs and imaging.   Blood pressure 130/86, pulse 91, temperature 97.9 F (36.6 C), weight 175 lb (79.4 kg), SpO2 98 %.   HPI 64 y.o. WF with long standing history of AB pain, constipation, back pain, normal colonoscopy last year. Had CT AB 2018 that was normal. Sent recently for pelvic floor PT for the AB pain and constipation, last saw Nevin Bloodgood NP 10/2018 with GI and Dr. Dellis Filbert GYN for AB pain 01/2019.  She states 5-6 days ago she had severe attack of left lower quadrant pain. She states she would double over in pain, have a small amount of stool come out, then double over in pain and a small amount of stool came out.  She has been having nausea, constant, only able to keep liquids down.   She is passing gas, last BM this AM.  No blood in stool, no dark black stool.  States with standing able to feel lump in her AB.  She is on tumeric, cinnamon and zinc, Vitamin C She has been taking aleve daily for months due to her back pain, stopped when the nausea started. No ETOH.   Blood pressure 130/86, pulse 91, temperature 97.9 F (36.6 C), weight 175 lb (79.4 kg), SpO2 98 %.  Medications   Current Outpatient Medications (Cardiovascular):  .  hydrochlorothiazide (HYDRODIURIL) 12.5 MG tablet, Take 1-2 tablets once daily as needed for swelling   Current Outpatient Medications (Analgesics):  .  aspirin EC 81 MG tablet, Take 81 mg by mouth. Takes twice a week   Current Outpatient Medications  (Other):  Marland Kitchen  Ascorbic Acid (VITAMIN C PO), Take 500-1,000 Units by mouth daily. Marland Kitchen  b complex vitamins tablet, Take 1 tablet by mouth daily. Marland Kitchen  BIOTIN PO, Take 5,000 mcg by mouth daily.  .  Cholecalciferol (VITAMIN D) 2000 units tablet, Take 4,000 Units by mouth daily.  Marland Kitchen  CINNAMON PO, Take 1 tablet by mouth as needed.  .  cyclobenzaprine (FLEXERIL) 10 MG tablet, Take 1 tablet (10 mg total) by mouth at bedtime as needed for muscle spasms. .  Magnesium 400 MG TABS, Take 1 tablet by mouth daily.  .  polyethylene glycol (MIRALAX / GLYCOLAX) 17 g packet, Take 17 g by mouth daily. .  Probiotic Product (ALIGN) 4 MG CAPS, Take 1 capsule by mouth at bedtime.  .  thiamine (VITAMIN B-1) 100 MG tablet, Take 100 mg by mouth every other day.  .  Turmeric (QC TUMERIC COMPLEX PO), Take by mouth. Marland Kitchen  ZINC-VITAMIN C PO, Take by mouth daily. .  ciprofloxacin (CIPRO) 500 MG tablet, Take 1 tablet (500 mg total) by mouth 2 (two) times daily for 7 days. .  famotidine (PEPCID) 40 MG tablet, Take 1 tablet (40 mg total) by mouth every evening. Marland Kitchen  Hyoscyamine Sulfate SL (LEVSIN/SL) 0.125 MG SUBL, Place 0.125 mg under the tongue every 4 (four) hours as needed. .  metroNIDAZOLE (FLAGYL)  500 MG tablet, Take 1 tablet (500 mg total) by mouth 3 (three) times daily for 7 days. .  ondansetron (ZOFRAN) 8 MG tablet, 1/2-1 tablet as needed for nausea and vomiting every 6 hours.  Problem list She has Lung nodule; Factor V Leiden ([place]); Mixed hyperlipidemia; Elevated homocysteine; Vitamin D deficiency; Endometriosis; Liver hemangioma; Hiatal hernia; CKD (chronic kidney disease); NASH (nonalcoholic steatohepatitis); and Low back pain on their problem list.   Review of Systems  Constitutional: Positive for appetite change, chills and fatigue. Negative for diaphoresis, fever and unexpected weight change.  HENT: Negative.   Respiratory: Negative.   Gastrointestinal: Positive for abdominal distention, abdominal pain, constipation,  diarrhea, nausea and vomiting. Negative for anal bleeding and blood in stool.       Black stool  Genitourinary: Negative.   Musculoskeletal: Positive for back pain. Negative for arthralgias, gait problem, joint swelling, myalgias, neck pain and neck stiffness.  Skin: Negative.   Neurological: Positive for light-headedness. Negative for dizziness, tremors, seizures, syncope, facial asymmetry, speech difficulty, weakness, numbness and headaches.  Psychiatric/Behavioral: Negative for behavioral problems, confusion, decreased concentration, dysphoric mood and hallucinations. The patient is nervous/anxious. The patient is not hyperactive.        Objective:   Physical Exam Constitutional:      Appearance: She is well-developed.  HENT:     Head: Normocephalic and atraumatic.     Right Ear: External ear normal.     Left Ear: External ear normal.  Eyes:     Conjunctiva/sclera: Conjunctivae normal.     Pupils: Pupils are equal, round, and reactive to light.  Neck:     Thyroid: No thyromegaly.  Cardiovascular:     Rate and Rhythm: Normal rate and regular rhythm.     Heart sounds: Normal heart sounds. No murmur. No friction rub. No gallop.   Pulmonary:     Effort: Pulmonary effort is normal. No respiratory distress.     Breath sounds: Normal breath sounds. No wheezing.  Abdominal:     General: Bowel sounds are decreased. There is distension. There is no abdominal bruit.     Palpations: Abdomen is soft. Abdomen is not rigid. There is no shifting dullness, mass or pulsatile mass.     Tenderness: There is abdominal tenderness in the epigastric area and left lower quadrant. There is guarding. There is no rebound. Negative signs include Murphy's sign, Rovsing's sign, McBurney's sign, psoas sign and obturator sign.     Hernia: No hernia is present.     Comments: Negative heel tap  Musculoskeletal:        General: Normal range of motion.     Cervical back: Normal range of motion and neck supple.   Lymphadenopathy:     Cervical: No cervical adenopathy.  Skin:    General: Skin is warm and dry.  Neurological:     Mental Status: She is alert and oriented to person, place, and time.

## 2019-07-26 ENCOUNTER — Encounter: Payer: Self-pay | Admitting: Physician Assistant

## 2019-07-26 ENCOUNTER — Emergency Department (HOSPITAL_COMMUNITY): Payer: 59

## 2019-07-26 ENCOUNTER — Encounter (HOSPITAL_COMMUNITY): Payer: Self-pay

## 2019-07-26 ENCOUNTER — Other Ambulatory Visit: Payer: Self-pay

## 2019-07-26 ENCOUNTER — Ambulatory Visit (INDEPENDENT_AMBULATORY_CARE_PROVIDER_SITE_OTHER): Payer: 59 | Admitting: Physician Assistant

## 2019-07-26 ENCOUNTER — Emergency Department (HOSPITAL_COMMUNITY)
Admission: EM | Admit: 2019-07-26 | Discharge: 2019-07-26 | Disposition: A | Discharge status: Home / Self Care | Payer: 59 | Attending: Emergency Medicine | Admitting: Emergency Medicine

## 2019-07-26 VITALS — BP 130/86 | HR 91 | Temp 97.9°F | Wt 175.0 lb

## 2019-07-26 DIAGNOSIS — K921 Melena: Secondary | ICD-10-CM | POA: Diagnosis not present

## 2019-07-26 DIAGNOSIS — R1030 Lower abdominal pain, unspecified: Secondary | ICD-10-CM | POA: Diagnosis present

## 2019-07-26 DIAGNOSIS — Z79899 Other long term (current) drug therapy: Secondary | ICD-10-CM | POA: Diagnosis not present

## 2019-07-26 DIAGNOSIS — R1032 Left lower quadrant pain: Secondary | ICD-10-CM

## 2019-07-26 DIAGNOSIS — K769 Liver disease, unspecified: Secondary | ICD-10-CM | POA: Diagnosis not present

## 2019-07-26 DIAGNOSIS — K5792 Diverticulitis of intestine, part unspecified, without perforation or abscess without bleeding: Secondary | ICD-10-CM | POA: Diagnosis not present

## 2019-07-26 DIAGNOSIS — N189 Chronic kidney disease, unspecified: Secondary | ICD-10-CM | POA: Insufficient documentation

## 2019-07-26 LAB — COMPREHENSIVE METABOLIC PANEL
ALT: 25 U/L (ref 0–44)
AST: 21 U/L (ref 15–41)
Albumin: 4.2 g/dL (ref 3.5–5.0)
Alkaline Phosphatase: 55 U/L (ref 38–126)
Anion gap: 13 (ref 5–15)
BUN: 15 mg/dL (ref 8–23)
CO2: 24 mmol/L (ref 22–32)
Calcium: 9.6 mg/dL (ref 8.9–10.3)
Chloride: 106 mmol/L (ref 98–111)
Creatinine, Ser: 0.72 mg/dL (ref 0.44–1.00)
GFR calc Af Amer: >60 mL/min (ref >60)
GFR calc non Af Amer: >60 mL/min (ref >60)
Glucose, Bld: 99 mg/dL (ref 70–99)
Potassium: 4.3 mmol/L (ref 3.5–5.1)
Sodium: 143 mmol/L (ref 135–145)
Total Bilirubin: 0.7 mg/dL (ref 0.3–1.2)
Total Protein: 7.2 g/dL (ref 6.5–8.1)

## 2019-07-26 LAB — CBC
HCT: 43.4 % (ref 36.0–46.0)
Hemoglobin: 14.1 g/dL (ref 12.0–15.0)
MCH: 30.7 pg (ref 26.0–34.0)
MCHC: 32.5 g/dL (ref 30.0–36.0)
MCV: 94.6 fL (ref 80.0–100.0)
Platelets: 281 10*3/uL (ref 150–400)
RBC: 4.59 MIL/uL (ref 3.87–5.11)
RDW: 11.8 % (ref 11.5–15.5)
WBC: 5.2 10*3/uL (ref 4.0–10.5)
nRBC: 0 % (ref 0.0–0.2)

## 2019-07-26 LAB — LIPASE, BLOOD: Lipase: 23 U/L (ref 11–51)

## 2019-07-26 IMAGING — CT CT ABD-PELV W/ CM
2 of 5 series · 15 of 46 positions shown, 17 images · IV contrast (omnipaque)
Comparison: None

CLINICAL DATA: Suspected diverticulitis.

EXAM:
CT ABDOMEN AND PELVIS WITH CONTRAST
TECHNIQUE: Multidetector CT imaging of the abdomen and pelvis was performed
using the standard protocol following bolus administration of
intravenous contrast.
CONTRAST:  100mL OMNIPAQUE IOHEXOL 300 MG/ML  SOLN

[Series 3: abdomen 5.0 · axial · 0.75mm/px · z∈[-445,-70]mm · 12 of 87 slices shown, 14 images]
[im 6/87  soft-tissue]
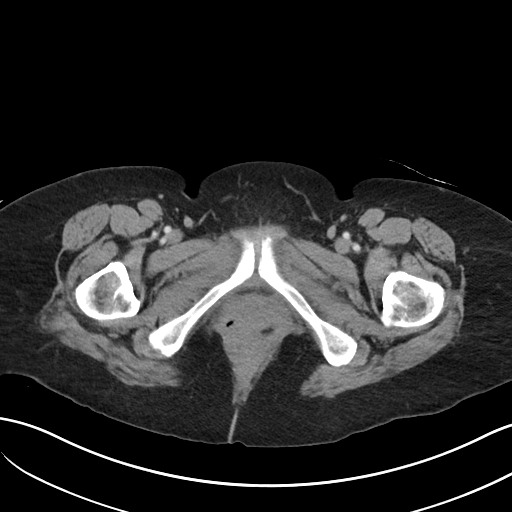
[im 6/87  bone]
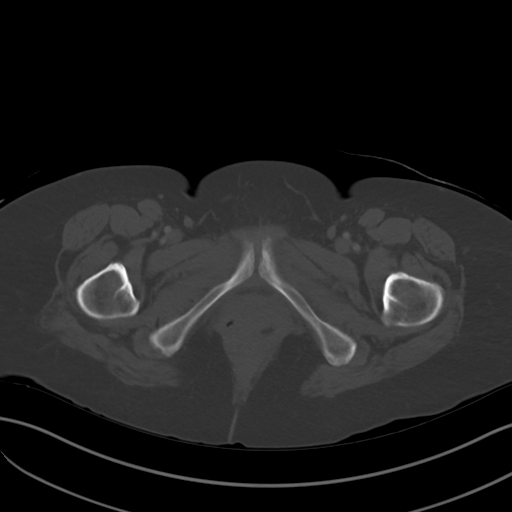
[im 12/87  soft-tissue]
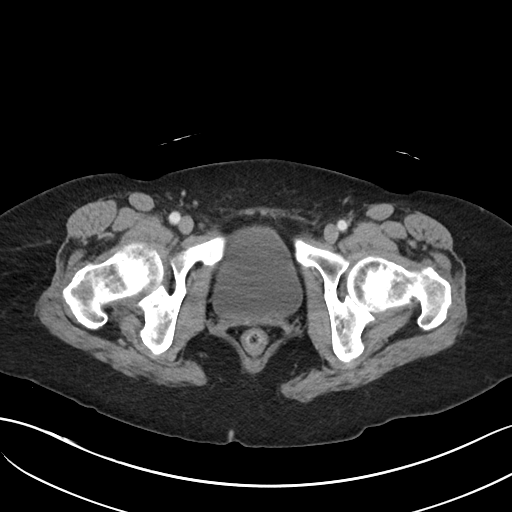
[im 18/87  soft-tissue]
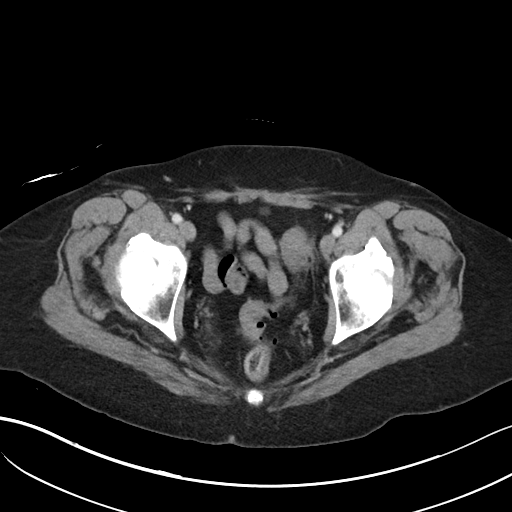
[im 29/87  soft-tissue]
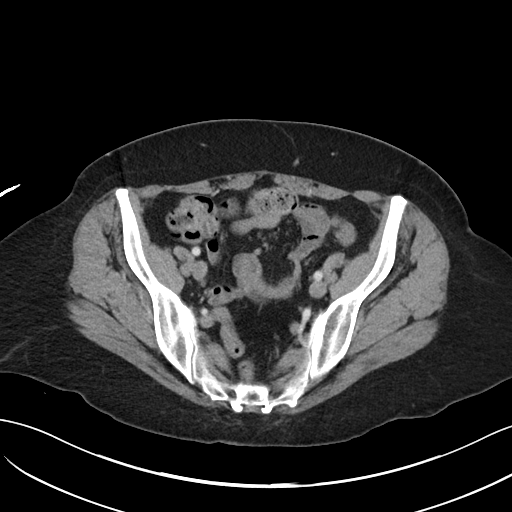
[im 35/87  soft-tissue]
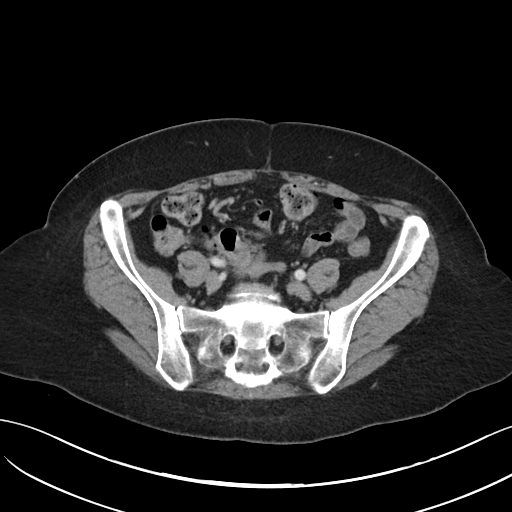
[im 41/87  soft-tissue]
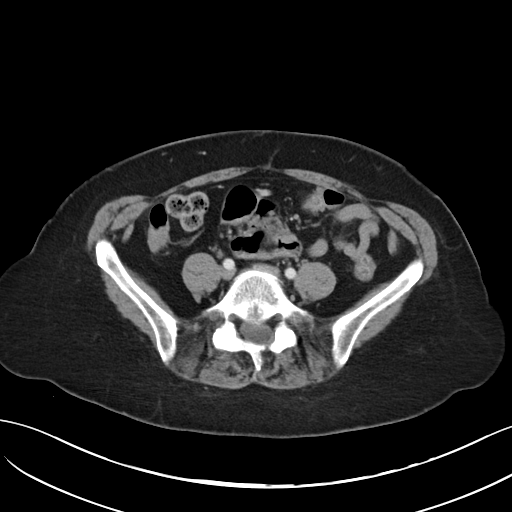
[im 46/87  soft-tissue]
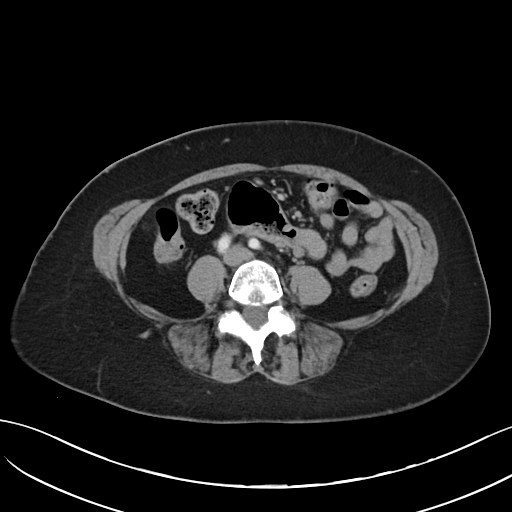
[im 52/87  soft-tissue]
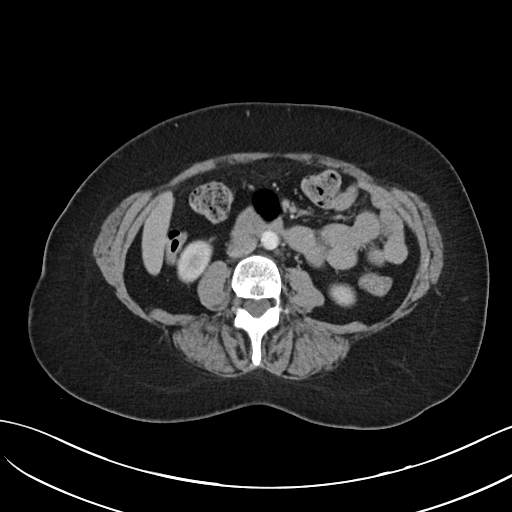
[im 58/87  soft-tissue]
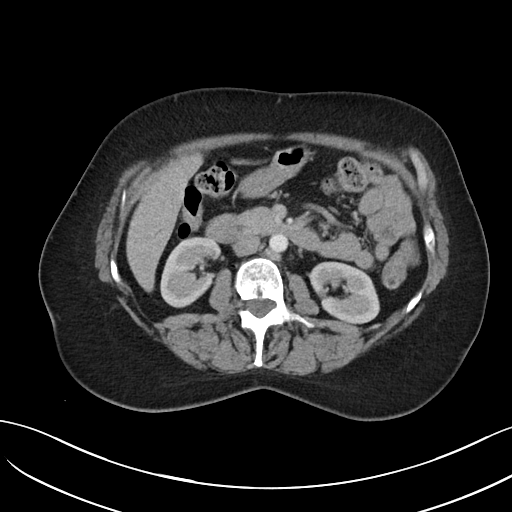
[im 58/87  bone]
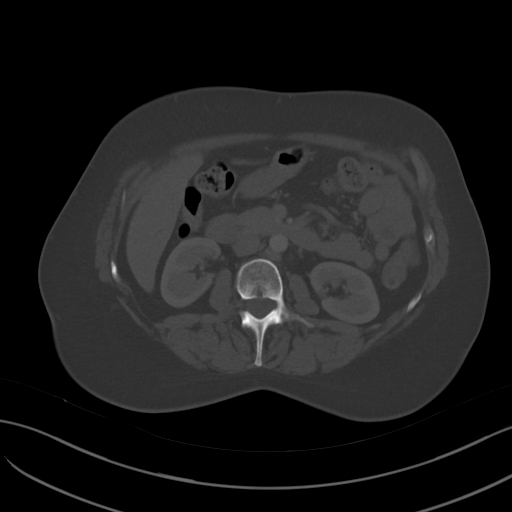
[im 69/87  soft-tissue]
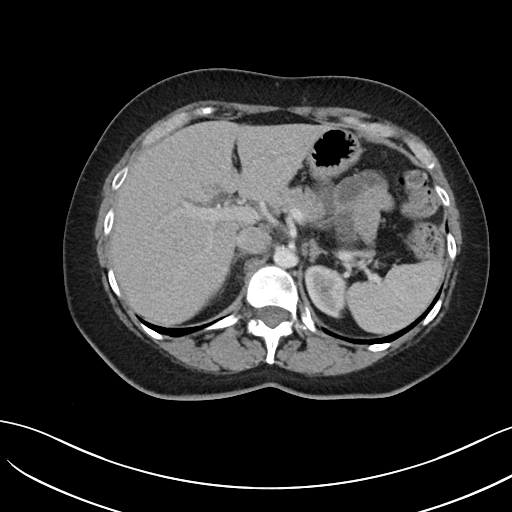
[im 75/87  soft-tissue]
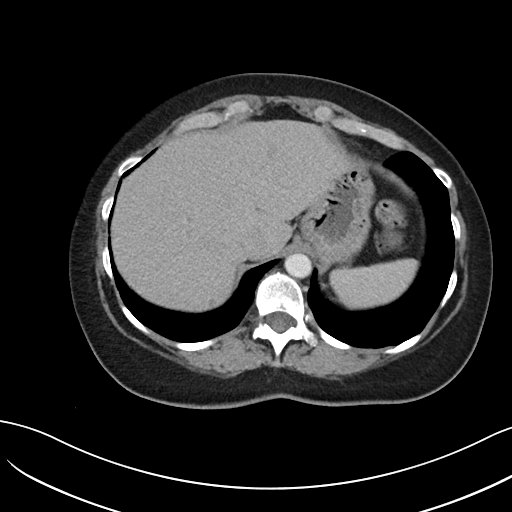
[im 81/87  soft-tissue]
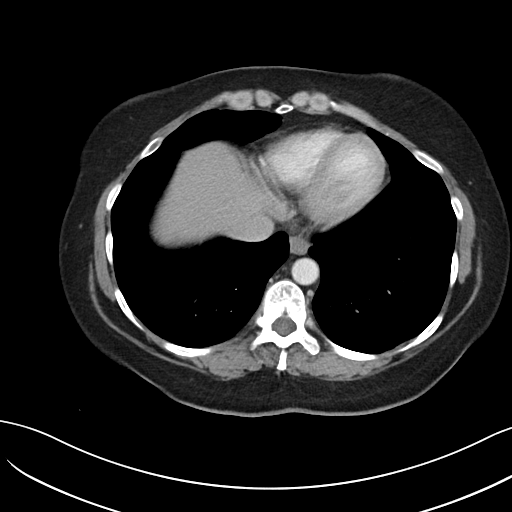

[Series 8: abdomen 3.0 mpr cor · coronal · 0.64mm/px · 3 of 85 slices shown]
[im 29/85  soft-tissue]
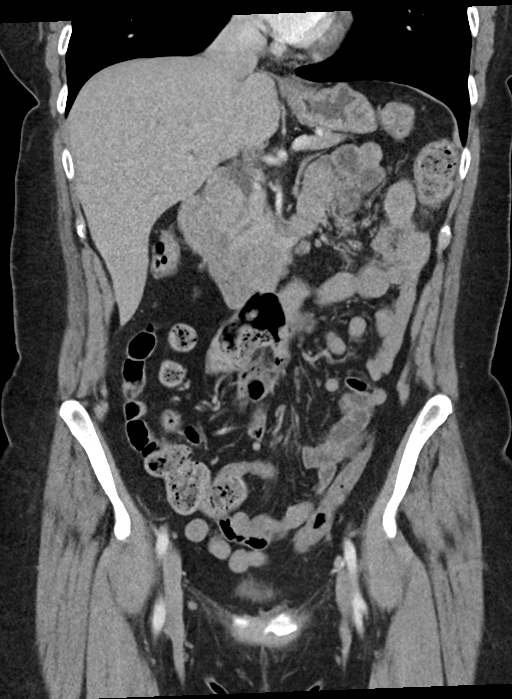
[im 38/85  soft-tissue]
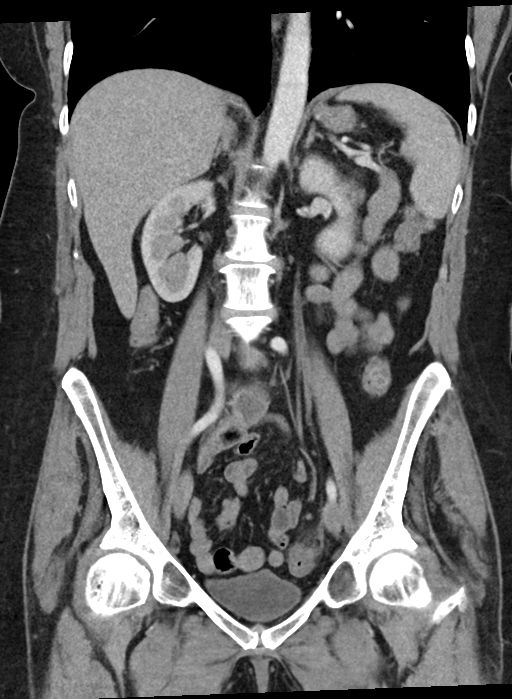
[im 47/85  soft-tissue]
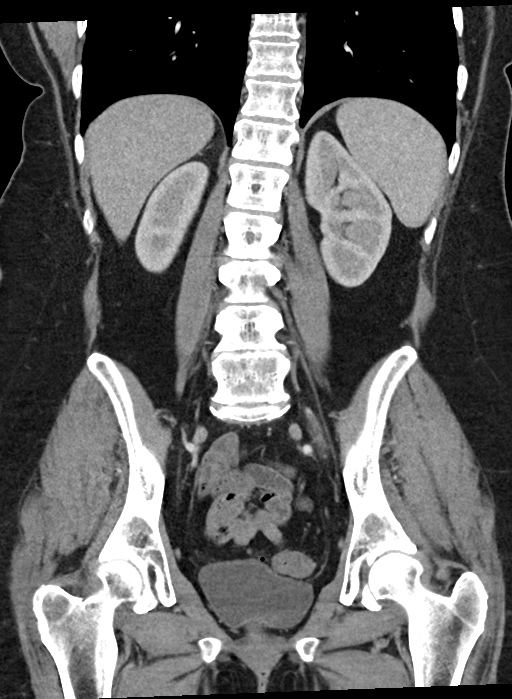

[15 of 46 positions shown; findings below may reference images not displayed]

FINDINGS: Lower chest: Basilar atelectasis without effusion or consolidation.

Hepatobiliary: Vague area of low density in the left hepatic lobe
lateral segment (image 14, series [DATE] x 1.1 cm. Does not meet
criteria for cyst or hemangioma.

Subtle area of low attenuation also posterior right hepatic lobe
measuring 6 mm. Lobular hepatic contours. Post cholecystectomy
without evidence of biliary ductal dilation.

Pancreas: Pancreas is normal without ductal dilation or suspicious
lesion. No peripancreatic inflammation.

Spleen: Spleen is normal size without focal lesion.

Adrenals/Urinary Tract: Adrenal glands are normal. Kidneys enhance
symmetrically. No hydronephrosis.

Stomach/Bowel: Stomach and small bowel with normal CT appearance.
Appendix is normal.

Scattered diverticulosis. The segmental thickening of the
descending/sigmoid colon with pericolonic stranding and adjacent
fascial thickening. Small lymph node in the sigmoid mesentery.

Vascular/Lymphatic: Vascular structures in the abdomen are patent.
No upper abdominal adenopathy or retroperitoneal adenopathy.

Reproductive: Urinary bladder is normal. Uterus is surgically
absent. No adnexal mass. No pelvic adenopathy.

Other: No free air. No abscess.

Musculoskeletal: No acute bone finding or destructive bone process.
IMPRESSION: 1. Findings consistent with acute diverticulitis of the
descending/sigmoid colon. No abscess or free air. Given segmental
thickening would suggest correlation with recent colonoscopy results
or follow-up colonoscopy as warranted.
2. Vague area of low density in the left hepatic lobe lateral
segment measuring 1.4 x 1.1 cm. Does not meet criteria for cyst or
hemangioma. Subtle 6 mm area of low attenuation also in the
posterior right hepatic lobe. When the patient is clinically stable
and able to follow directions and hold their breath (preferably as
an outpatient) further evaluation with dedicated abdominal MRI
should be considered.
3. These results were called by telephone at the time of
interpretation on [DATE] at [DATE] to provider HERMENEGILDO ,
who verbally acknowledged these results.

## 2019-07-26 MED ORDER — FENTANYL CITRATE (PF) 100 MCG/2ML IJ SOLN
100.0000 ug | Freq: Once | INTRAMUSCULAR | Status: AC
  Administered 2019-07-26: 100 ug via INTRAVENOUS
  Filled 2019-07-26: qty 2

## 2019-07-26 MED ORDER — METRONIDAZOLE 500 MG PO TABS
500.0000 mg | ORAL_TABLET | Freq: Once | ORAL | Status: AC
  Administered 2019-07-26: 500 mg via ORAL
  Filled 2019-07-26: qty 1

## 2019-07-26 MED ORDER — FAMOTIDINE 40 MG PO TABS: 40.0000 mg | ORAL_TABLET | Freq: Every evening | ORAL | 1 refills | Status: DC

## 2019-07-26 MED ORDER — ONDANSETRON HCL 4 MG/2ML IJ SOLN
4.0000 mg | Freq: Once | INTRAMUSCULAR | Status: AC
  Administered 2019-07-26: 4 mg via INTRAVENOUS
  Filled 2019-07-26: qty 2

## 2019-07-26 MED ORDER — SODIUM CHLORIDE 0.9 % IV BOLUS
1000.0000 mL | Freq: Once | INTRAVENOUS | Status: AC
  Administered 2019-07-26: 1000 mL via INTRAVENOUS

## 2019-07-26 MED ORDER — METRONIDAZOLE 500 MG PO TABS: 500.0000 mg | ORAL_TABLET | Freq: Three times a day (TID) | ORAL | 0 refills | Status: AC

## 2019-07-26 MED ORDER — ONDANSETRON HCL 8 MG PO TABS: ORAL_TABLET | ORAL | 1 refills | Status: DC

## 2019-07-26 MED ORDER — CIPROFLOXACIN HCL 500 MG PO TABS: 500.0000 mg | ORAL_TABLET | Freq: Two times a day (BID) | ORAL | 0 refills | Status: AC

## 2019-07-26 MED ORDER — IOHEXOL 300 MG/ML  SOLN
100.0000 mL | Freq: Once | INTRAMUSCULAR | Status: AC | PRN
  Administered 2019-07-26: 18:00:00 100 mL via INTRAVENOUS

## 2019-07-26 MED ORDER — HYOSCYAMINE SULFATE SL 0.125 MG SL SUBL: 0.1250 mg | SUBLINGUAL_TABLET | SUBLINGUAL | 0 refills | Status: DC | PRN

## 2019-07-26 MED ORDER — CIPROFLOXACIN HCL 500 MG PO TABS
500.0000 mg | ORAL_TABLET | Freq: Once | ORAL | Status: AC
  Administered 2019-07-26: 500 mg via ORAL
  Filled 2019-07-26: qty 1

## 2019-07-26 NOTE — Discharge Instructions (Signed)
If you develop worsening, continued, or recurrent abdominal pain, uncontrolled vomiting, fever, chest or back pain, or any other new/concerning symptoms then return to the ER for evaluation.    Your CT scan showed a nonspecific lesion in your liver.  You will need MRI as an outpatient from your primary care provider to further evaluate what this is.

## 2019-07-26 NOTE — ED Triage Notes (Signed)
Pt from home with ems for lower abd pain for the past week. No n.v. pt a.o

## 2019-07-26 NOTE — ED Provider Notes (Signed)
[facility] Provider Note   CSN: [id number] Arrival date & time: 07/26/19  1120     History Chief Complaint  Patient presents with  . Abdominal Pain    Samantha Mckinney is a 64 y.o. female.  HPI 64 year old female presents with abdominal pain.  She is had some abdominal pain in her lower abdomen for quite some time.  However over the last 5 days or so the pain has been worse and she is noticed nausea, bloating, and increased belching.  Had some increased pain with bowel movements with small bowel movements a day or 2 ago.  Has not noticed any blood.  Went to her doctor today and after they drew some blood, she went and had another bowel movement but this time it was black/dark.  She is also feeling near syncopal after this happened.  She was encouraged to come to the ED for evaluation.  Pain is currently moderate.  No fevers.  She is been having back pain for several weeks on her left lower back.  No urinary symptoms.   Past Medical History:  Diagnosis Date  . Allergy   . Cataract   . Colon polyps   . Elevated cholesterol   . Endometriosis 02/02/2017  . Factor V Leiden ([place]) 02/02/2017  . GERD (gastroesophageal reflux disease)   . IBS (irritable bowel syndrome)   . Liver hemangioma 02/02/2017  . NASH (nonalcoholic steatohepatitis) 02/02/2017    Patient Active Problem List   Diagnosis Date Noted  . Low back pain 07/16/2019  . Lung nodule 02/02/2017  . Factor V Leiden ([place]) 02/02/2017  . Mixed hyperlipidemia 02/02/2017  . 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  . Vitamin D deficiency 02/02/2017  . Endometriosis 02/02/2017  . Liver hemangioma 02/02/2017  . Hiatal hernia 02/02/2017  . CKD (chronic kidney disease) 02/02/2017  . NASH (nonalcoholic steatohepatitis) 02/02/2017    Past Surgical History:  Procedure Laterality Date  . ABDOMINAL HYSTERECTOMY  1998   TOTAL, on estrogen  until 2012  . BACK SURGERY  2000   disc removed L4L5  .  CHOLECYSTECTOMY    . ESOPHAGOGASTRODUODENOSCOPY  2014   In [place]  . HEMORRHOID SURGERY  1996  . LAPAROSCOPIC ENDOMETRIOSIS FULGURATION     x 6, 1980-1991     OB History    Gravida  1   Para  1   Term      Preterm      AB      Living  1     SAB      TAB      Ectopic      Multiple      Live Births              Family History  Problem Relation Age of Onset  . Cirrhosis Mother   . Diabetes Mother   . Arthritis Mother   . Gout Mother   . Depression Mother   . Bowel Disease Mother   . Heart disease Father   . Diabetes Father   . Valvular heart disease Sister   . Depression Sister   . Diabetes Brother   . Hyperlipidemia Son   . Cancer Maternal Grandmother 50       colon cancer  . Colon cancer Maternal Grandmother   . Atrial fibrillation Brother   . Valvular heart disease Brother   . Hypertension Brother   . Esophageal cancer Neg Hx     Social History   Tobacco Use  .  Smoking status: Never Smoker  . Smokeless tobacco: Never Used  Substance Use Topics  . Alcohol use: Yes    Comment: OCC   . Drug use: Never    Home Medications Prior to Admission medications   Medication Sig Start Date End Date Taking? Authorizing Provider  Ascorbic Acid (VITAMIN C PO) Take 500-1,000 Units by mouth daily.   Yes [provider]  b complex vitamins tablet Take 1 tablet by mouth daily.   Yes [provider]  BIOTIN PO Take 5,000 mcg by mouth daily.    Yes [provider]  Cholecalciferol (VITAMIN D) 2000 units tablet Take 4,000 Units by mouth daily.    Yes [provider]  Magnesium 400 MG TABS Take 400 mg by mouth daily.    Yes [provider]  Multiple Vitamin (MULTIVITAMIN PO) Take 1 capsule by mouth daily. Nature Bounty 100% fruit & vegetable.   Yes [provider]  polyethylene glycol (MIRALAX / GLYCOLAX) 17 g packet Take 17 g by mouth daily as needed for mild constipation.    Yes [provider]    Probiotic Product (ALIGN) 4 MG CAPS Take 1 capsule by mouth at bedtime.    Yes [provider]  thiamine (VITAMIN B-1) 100 MG tablet Take 100 mg by mouth every other day.    Yes [provider]  Turmeric (QC TUMERIC COMPLEX PO) Take 1 capsule by mouth daily.    Yes [provider]  ZINC-VITAMIN C PO Take 1 tablet by mouth daily.    Yes [provider]  ciprofloxacin (CIPRO) 500 MG tablet Take 1 tablet (500 mg total) by mouth 2 (two) times daily for 7 days. Patient not taking: Reported on 07/26/2019 07/26/19 08/02/19  Vicie Mutters, PA-C  cyclobenzaprine (FLEXERIL) 10 MG tablet Take 1 tablet (10 mg total) by mouth at bedtime as needed for muscle spasms. Patient not taking: Reported on 07/26/2019 02/26/18   Vicie Mutters, PA-C  famotidine (PEPCID) 40 MG tablet Take 1 tablet (40 mg total) by mouth every evening. Patient not taking: Reported on 07/26/2019 07/26/19 07/25/20  Vicie Mutters, PA-C  hydrochlorothiazide (HYDRODIURIL) 12.5 MG tablet Take 1-2 tablets once daily as needed for swelling Patient not taking: Reported on 07/26/2019 07/01/19   Vicie Mutters, PA-C  Hyoscyamine Sulfate SL (LEVSIN/SL) 0.125 MG SUBL Place 0.125 mg under the tongue every 4 (four) hours as needed. Patient not taking: Reported on 07/26/2019 07/26/19   Vicie Mutters, PA-C  metroNIDAZOLE (FLAGYL) 500 MG tablet Take 1 tablet (500 mg total) by mouth 3 (three) times daily for 7 days. Patient not taking: Reported on 07/26/2019 07/26/19 08/02/19  Vicie Mutters, PA-C  ondansetron (ZOFRAN) 8 MG tablet 1/2-1 tablet as needed for nausea and vomiting every 6 hours. Patient not taking: Reported on 07/26/2019 07/26/19   Vicie Mutters, PA-C    Allergies    Morphine and related  Review of Systems   Review of Systems  Constitutional: Negative for fever.  Gastrointestinal: Positive for abdominal pain and nausea. Negative for vomiting.  Genitourinary: Negative for dysuria.  Musculoskeletal: Positive for back  pain.  All other systems reviewed and are negative.   Physical Exam Updated Vital Signs BP (!) 149/80 (BP Location: Left Arm)   Pulse 84   Temp 98.3 F (36.8 C) (Oral)   Resp 17   Ht 5' 6"  (1.676 m)   Wt 79.4 kg   SpO2 98%   BMI 28.25 kg/m   Physical Exam Vitals and nursing note reviewed.  Constitutional:      General: She is not in acute distress.    Appearance: She is well-developed. She is not ill-appearing or diaphoretic.  HENT:     Head: Normocephalic and atraumatic.     Right Ear: External ear normal.     Left Ear: External ear normal.     Nose: Nose normal.  Eyes:     General:        Right eye: No discharge.        Left eye: No discharge.  Cardiovascular:     Rate and Rhythm: Normal rate and regular rhythm.     Heart sounds: Normal heart sounds.  Pulmonary:     Effort: Pulmonary effort is normal.     Breath sounds: Normal breath sounds.  Abdominal:     Palpations: Abdomen is soft.     Tenderness: There is abdominal tenderness in the right lower quadrant, suprapubic area and left lower quadrant. There is no right CVA tenderness or left CVA tenderness.  Genitourinary:    Comments: Patient defers rectal exam Skin:    General: Skin is warm and dry.  Neurological:     Mental Status: She is alert.  Psychiatric:        Mood and Affect: Mood is not anxious.     ED Results / Procedures / Treatments   Labs (all labs ordered are listed, but only abnormal results are displayed) Labs Reviewed  LIPASE, BLOOD  COMPREHENSIVE METABOLIC PANEL  CBC  URINALYSIS, ROUTINE W REFLEX MICROSCOPIC    EKG None  Radiology CT ABDOMEN PELVIS W CONTRAST  Result Date: 07/26/2019 CLINICAL DATA:  Suspected diverticulitis. EXAM: CT ABDOMEN AND PELVIS WITH CONTRAST TECHNIQUE: Multidetector CT imaging of the abdomen and pelvis was performed using the standard protocol following bolus administration of intravenous contrast. CONTRAST:  166m OMNIPAQUE IOHEXOL 300 MG/ML  SOLN  COMPARISON:  None FINDINGS: Lower chest: Basilar atelectasis without effusion or consolidation. Hepatobiliary: Vague area of low density in the left hepatic lobe lateral segment (image 14, series 3) 1.4 x 1.1 cm. Does not meet criteria for cyst or hemangioma. Subtle area of low attenuation also posterior right hepatic lobe measuring 6 mm. Lobular hepatic contours. Post cholecystectomy without evidence of biliary ductal dilation. Pancreas: Pancreas is normal without ductal dilation or suspicious lesion. No peripancreatic inflammation. Spleen: Spleen is normal size without focal lesion. Adrenals/Urinary Tract: Adrenal glands are normal. Kidneys enhance symmetrically. No hydronephrosis. Stomach/Bowel: Stomach and small bowel with normal CT appearance. Appendix is normal. Scattered diverticulosis. The segmental thickening of the descending/sigmoid colon with pericolonic stranding and adjacent fascial thickening. Small lymph node in the sigmoid mesentery. Vascular/Lymphatic: Vascular structures in the abdomen are patent. No upper abdominal adenopathy or retroperitoneal adenopathy. Reproductive: Urinary bladder is normal. Uterus is surgically absent. No adnexal mass. No pelvic adenopathy. Other: No free air. No abscess. Musculoskeletal: No acute bone finding or destructive bone process. IMPRESSION: 1. Findings consistent with acute diverticulitis of the descending/sigmoid colon. No abscess or free air. Given segmental thickening would suggest correlation with recent colonoscopy results or follow-up colonoscopy as warranted. 2. Vague area of low density in the left hepatic lobe lateral segment measuring 1.4 x 1.1 cm. Does not meet criteria for cyst or hemangioma. Subtle 6 mm area of low attenuation also in the posterior right hepatic lobe. When the patient is clinically stable and able to follow directions and hold their breath (preferably as an outpatient) further evaluation with dedicated abdominal MRI should be  considered. 3. These results  were called by telephone at the time of interpretation on 07/26/2019 at 5:52 pm to provider Sherwood Gambler , who verbally acknowledged these results. Electronically Signed   By: Zetta Bills M.D.   On: 07/26/2019 17:53    Procedures Procedures (including critical care time)  Medications Ordered in ED Medications  ciprofloxacin (CIPRO) tablet 500 mg (has no administration in time range)  metroNIDAZOLE (FLAGYL) tablet 500 mg (has no administration in time range)  sodium chloride 0.9 % bolus 1,000 mL (1,000 mLs Intravenous New Bag/Given 07/26/19 1654)  ondansetron (ZOFRAN) injection 4 mg (4 mg Intravenous Given 07/26/19 1655)  fentaNYL (SUBLIMAZE) injection 100 mcg (100 mcg Intravenous Given 07/26/19 1655)  iohexol (OMNIPAQUE) 300 MG/ML solution 100 mL (100 mLs Intravenous Contrast Given 07/26/19 1731)    ED Course  I have reviewed the triage vital signs and the nursing notes.  Pertinent labs & imaging results that were available during my care of the patient were reviewed by me and considered in my medical decision making (see chart for details).    MDM Rules/Calculators/A&P                      CT obtained given focal lower abdominal tenderness.  CT does show acute diverticulitis without complication.  There is also nonspecific liver lesion which I discussed with patient and husband.  She was prescribed Cipro and Flagyl by her PCP and so I will give her her first dose here and she will start those tomorrow.  Otherwise, she feels like her pain is controlled and can take over-the-counter meds for her pain.  Offered hydrocodone but she declines.  We discussed strict return precautions. Final Clinical Impression(s) / ED Diagnoses Final diagnoses:  Acute diverticulitis  Liver lesion    Rx / DC Orders ED Discharge Orders    None       Sherwood Gambler, MD 07/26/19 [phone]

## 2019-07-26 NOTE — Patient Instructions (Addendum)
Stop the tumeric, cinnamon, zinc, and vitamin C Can take tylenol, try to avoid aleve/ibuprofen  Can take zofran for nausea  Can take levsin for stomach cramping   Start on cipro/flaygl for diverticulitis Do liquid diet for a few days with jello/brothes then go to bowel rest Bowel rest means no wheat/fiber, so please eat white stuff like potatoes, soup, crackers until you are feeling better and then slowly advance your diet back to veggies and fiber like wheat.   We will get a CT scan of your AB  Please go to the ER if you have any severe AB pain, unable to hold down food/water, blood in stool or vomit, chest pain, shortness of breath, or any worsening symptoms.    Diverticulitis Diverticulitis is inflammation or infection of small pouches in your colon that form when you have a condition called diverticulosis. The pouches in your colon are called diverticula. Your colon, or large intestine, is where water is absorbed and stool is formed. Complications of diverticulitis can include:  Bleeding.  Severe infection.  Severe pain.  Perforation of your colon.  Obstruction of your colon.  What are the causes? Diverticulitis is caused by bacteria. Diverticulitis happens when stool becomes trapped in diverticula. This allows bacteria to grow in the diverticula, which can lead to inflammation and infection. What increases the risk? People with diverticulosis are at risk for diverticulitis. Eating a diet that does not include enough fiber from fruits and vegetables may make diverticulitis more likely to develop. What are the signs or symptoms? Symptoms of diverticulitis may include:  Abdominal pain and tenderness. The pain is normally located on the left side of the abdomen, but may occur in other areas.  Fever and chills.  Bloating.  Cramping.  Nausea.  Vomiting.  Constipation.  Diarrhea.  Blood in your stool.  How is this diagnosed? Your health care provider will ask  you about your medical history and do a physical exam. You may need to have tests done because many medical conditions can cause the same symptoms as diverticulitis. Tests may include:  Blood tests.  Urine tests.  Imaging tests of the abdomen, including X-rays and CT scans.  When your condition is under control, your health care provider may recommend that you have a colonoscopy. A colonoscopy can show how severe your diverticula are and whether something else is causing your symptoms. How is this treated? Most cases of diverticulitis are mild and can be treated at home. Treatment may include:  Taking over-the-counter pain medicines.  Following a clear liquid diet.  Taking antibiotic medicines by mouth for 7-10 days.  More severe cases may be treated at a hospital. Treatment may include:  Not eating or drinking.  Taking prescription pain medicine.  Receiving antibiotic medicines through an IV tube.  Receiving fluids and nutrition through an IV tube.  Surgery.  Follow these instructions at home:  Follow your health care provider's instructions carefully.  Follow a full liquid diet or other diet as directed by your health care provider. After your symptoms improve, your health care provider may tell you to change your diet. He or she may recommend you eat a high-fiber diet. Fruits and vegetables are good sources of fiber. Fiber makes it easier to pass stool.  Take fiber supplements or probiotics as directed by your health care provider.  Only take medicines as directed by your health care provider.  Keep all your follow-up appointments. Contact a health care provider if:  Your pain does not  improve.  You have a hard time eating food.  Your bowel movements do not return to normal. Get help right away if:  Your pain becomes worse.  Your symptoms do not get better.  Your symptoms suddenly get worse.  You have a fever.  You have repeated vomiting.  You have  bloody or black, tarry stools. This information is not intended to replace advice given to you by your health care provider. Make sure you discuss any questions you have with your health care provider. Document Released: 01/12/2005 Document Revised: 09/10/2015 Document Reviewed: 02/27/2013 Elsevier Interactive Patient Education  2017 [employer].

## 2019-07-26 NOTE — ED Notes (Signed)
Pt's O2 dropped to 84% after Fentanyl was given. Pt placed on 2L O2, and saturation came back up to 98%

## 2019-08-07 NOTE — Progress Notes (Signed)
Complete Physical  Assessment and Plan:  Encounter for general adult medical examination with abnormal findings 1 year  Abnormal liver CT Get MRI  Factor V Leiden ([place]) Monitor  NASH (nonalcoholic steatohepatitis) -     COMPLETE METABOLIC PANEL WITH GFR  Chronic kidney disease, unspecified CKD stage -     COMPLETE METABOLIC PANEL WITH GFR Increase fluids, avoid NSAIDS, monitor sugars, will monitor  Elevated homocysteine Monitor  Mixed hyperlipidemia -     Lipid panel check lipids decrease fatty foods increase activity.   Lung nodule Get CXR, patient self reported   Vitamin D deficiency -     VITAMIN D 25 Hydroxy (Vit-D Deficiency, Fractures)  Low back pain, unspecified back pain laterality, unspecified chronicity, unspecified whether sciatica present Continue follow up ortho  Endometriosis S/p TAH  Hiatal hernia Monitor  Medication management -     Magnesium  Hypertension, unspecified type -     CBC with Differential/Platelet -     COMPLETE METABOLIC PANEL WITH GFR -     TSH -     Urinalysis, Routine w reflex microscopic -     Microalbumin / creatinine urine ratio -     EKG 12-Lead - continue medications, DASH diet, exercise and monitor at home. Call if greater than 130/80.   Liver disease -     MR ABDOMEN WO CONTRAST; Future Has history of hemangioma- however CT states it did not meet criteria for hemangioma so will proceed with workup especially given the patient's multitude of symptoms  Abdominal bloating -     Celiac Disease Comprehensive Panel with Reflexes -     metoCLOPramide (REGLAN) 5 MG tablet; Take 1 tablet (5 mg total) by mouth 3 (three) times daily. Stop lactulose for a while Sibling with celiac, check labs ? SBOG, follow up GI- may benefit from pelvic floor PT/dietician referral  Diverticulitis Patient improved, still with nausea ? Unable to tolerate lactose right now due to inflammation- stop for a while and can reintroduce Check  CBC Follow up with GI  Screening, anemia, deficiency, iron -     Iron,Total/Total Iron Binding Cap -     Vitamin B12  Screening for diabetes mellitus -     Hemoglobin A1c  Yeast dermatitis -     ketoconazole (NIZORAL) 2 % cream; Apply 1 application topically 2 (two) times daily. -     triamcinolone cream (KENALOG) 0.5 %; Apply 1 application topically 2 (two) times daily. If not improved follow up   Discussed med's effects and SE's. Screening labs and tests as requested with regular follow-up as recommended. Over 40 minutes of exam, counseling, chart review, and complex, high level critical decision making was performed this visit.  Future Appointments  Date Time Provider [place]  08/10/2020  3:00 PM Vicie Mutters, PA-C [id number] None    HPI  64 y.o. female  presents for a complete physical and follow up for has Lung nodule; Factor V Leiden ([place]); Mixed hyperlipidemia; Elevated homocysteine; Vitamin D deficiency; Endometriosis; Abnormal liver CT; Hiatal hernia; CKD (chronic kidney disease); NASH (nonalcoholic steatohepatitis); and Low back pain on their problem list..   Most recently she had diverticulitis, went to ER, treated with Cipro and flagyl. She states she is doing better but she continues to have nausea, gassy, constipation and sticking with mash potatoes and broths, she gets , will refer back to GI.  Had colonoscopy 2019 with Dr. Havery Moros.   Will do atkins protein shake for breakfast and lunch and cottage  cheese- a lot of milk products.  On the CT scan it did show 1.4 x 1.1 cm low density area and subtle 6 mm area on her liver that needs AB MRI follow up.   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 She has been going to PT, and they say she has "lumps" on bilateral legs. She wears compression socks.   She has history of NASH, CKD3 and elevated ANA and homocysteine.  Has had a negative connective tissue work up and has seen rheum.   Her blood pressure has  been controlled at home, today their BP is BP: 128/74 She does not workout due to pain. She denies chest pain, shortness of breath, dizziness.    She is on cholesterol medication and denies myalgias. Her cholesterol is not at goal. The cholesterol last visit was:   Lab Results  Component Value Date   CHOL 224 (H) 09/20/2018   HDL 61 09/20/2018   LDLCALC 131 (H) 09/20/2018   TRIG 187 (H) 09/20/2018   CHOLHDL 3.7 09/20/2018    She has been working on diet and exercise for prediabetes, and denies nausea, paresthesia of the feet, polydipsia and polyuria. Last A1C in the office was:  Lab Results  Component Value Date   HGBA1C 5.6 02/21/2018   Patient is on Vitamin D supplement.   Lab Results  Component Value Date   VD25OH 38 09/20/2018     BMI is Body mass index is 28.57 kg/m., she is working on diet and exercise. Wt Readings from Last 3 Encounters:  08/08/19 177 lb (80.3 kg)  07/26/19 175 lb (79.4 kg)  07/26/19 175 lb (79.4 kg)      Lab Results  Component Value Date   GFRNONAA >60 07/26/2019    Current Medications:    Current Outpatient Medications (Cardiovascular):  .  hydrochlorothiazide (HYDRODIURIL) 12.5 MG tablet, Take 1-2 tablets once daily as needed for swelling     Current Outpatient Medications (Other):  Marland Kitchen  Ascorbic Acid (VITAMIN C PO), Take 500-1,000 Units by mouth daily. Marland Kitchen  b complex vitamins tablet, Take 1 tablet by mouth daily. Marland Kitchen  BIOTIN PO, Take 5,000 mcg by mouth daily.  .  Cholecalciferol (VITAMIN D) 2000 units tablet, Take 4,000 Units by mouth daily.  .  cyclobenzaprine (FLEXERIL) 10 MG tablet, Take 1 tablet (10 mg total) by mouth at bedtime as needed for muscle spasms. .  famotidine (PEPCID) 40 MG tablet, Take 1 tablet (40 mg total) by mouth every evening. Marland Kitchen  Hyoscyamine Sulfate SL (LEVSIN/SL) 0.125 MG SUBL, Place 0.125 mg under the tongue every 4 (four) hours as needed. .  Magnesium 400 MG TABS, Take 400 mg by mouth daily.  .  Multiple Vitamin  (MULTIVITAMIN PO), Take 1 capsule by mouth daily. Nature Bounty 100% fruit & vegetable. .  ondansetron (ZOFRAN) 8 MG tablet, 1/2-1 tablet as needed for nausea and vomiting every 6 hours. .  polyethylene glycol (MIRALAX / GLYCOLAX) 17 g packet, Take 17 g by mouth daily as needed for mild constipation.  .  Probiotic Product (ALIGN) 4 MG CAPS, Take 1 capsule by mouth at bedtime.  .  thiamine (VITAMIN B-1) 100 MG tablet, Take 100 mg by mouth every other day.  .  Turmeric (QC TUMERIC COMPLEX PO), Take 1 capsule by mouth daily.  Marland Kitchen  ZINC-VITAMIN C PO, Take 1 tablet by mouth daily.   Allergies:  Allergies  Allergen Reactions  . Morphine And Related Swelling   Medical History:  She has Lung nodule; Factor V Leiden ([place]); Mixed hyperlipidemia; Elevated homocysteine; Vitamin D deficiency; Endometriosis; Abnormal liver CT; Hiatal hernia; CKD (chronic kidney disease); NASH (nonalcoholic steatohepatitis); and Low back pain on their problem list.   Health Maintenance:   Immunization History  Administered Date(s) Administered  . Tdap 02/21/2018    Tetanus: 2019 Pneumovax: Prevnar 13:  Flu vaccine: declines Shingrix:   LMP: TAH Pap: TAH MGM:03/27/2019  DEXA: N/A Colonoscopy: 12/25/2017 EGD: N/A  Patient Care Team: Unk Pinto, MD as PCP - General (Internal Medicine) Vicie Mutters, PA-C as Physician Assistant (Physician Assistant)  Surgical History:  She has a past surgical history that includes Cholecystectomy; Abdominal hysterectomy (1998); Laparoscopic endometriosis fulguration; Hemorrhoid surgery (1996); Back surgery (2000); and Esophagogastroduodenoscopy (2014). Family History:  Herfamily history includes Arthritis in her mother; Atrial fibrillation in her brother; Bowel Disease in her mother; Cancer (age of onset: 54) in her maternal grandmother; Cirrhosis in her mother; Colon cancer in her maternal grandmother; Depression in her mother and sister; Diabetes in her brother,  father, and mother; Gout in her mother; Heart disease in her father; Hyperlipidemia in her son; Hypertension in her brother; Valvular heart disease in her brother and sister. Social History:  She reports that she has never smoked. She has never used smokeless tobacco. She reports current alcohol use. She reports that she does not use drugs.  Review of Systems: Review of Systems  Constitutional: Positive for malaise/fatigue. Negative for chills, diaphoresis, fever and weight loss.  HENT: Negative.   Eyes: Negative.   Respiratory: Negative.   Cardiovascular: Positive for leg swelling. Negative for chest pain, palpitations, orthopnea, claudication and PND.  Gastrointestinal: Positive for heartburn. Negative for abdominal pain, blood in stool, constipation, diarrhea, melena, nausea and vomiting.  Genitourinary: Negative.   Musculoskeletal: Positive for myalgias. Negative for back pain, falls, joint pain and neck pain.  Skin: Negative for itching and rash.  Neurological: Negative.  Negative for weakness.  Endo/Heme/Allergies: Negative.   Psychiatric/Behavioral: Negative for hallucinations, memory loss, substance abuse and suicidal ideas. The patient has insomnia. The patient is not nervous/anxious.     Physical Exam: Estimated body mass index is 28.57 kg/m as calculated from the following:   Height as of this encounter: 5' 6"  (1.676 m).   Weight as of this encounter: 177 lb (80.3 kg). BP 128/74   Pulse 65   Temp (!) 97.5 F (36.4 C)   Ht 5' 6"  (1.676 m)   Wt 177 lb (80.3 kg)   SpO2 97%   BMI 28.57 kg/m  General Appearance: Well nourished, in no apparent distress.  Eyes: PERRLA, EOMs, conjunctiva no swelling or erythema, normal fundi and vessels.  Sinuses: No Frontal/maxillary tenderness  ENT/Mouth: Ext aud canals clear, normal light reflex with TMs without erythema, bulging. Good dentition. No erythema, swelling, or exudate on post pharynx. Tonsils not swollen or erythematous. Hearing  normal.  Neck: Supple, thyroid normal. No bruits  Respiratory: Respiratory effort normal, BS equal bilaterally without rales, rhonchi, wheezing or stridor.  Cardio: RRR without murmurs, rubs or gallops. Brisk peripheral pulses without edema.  Chest: symmetric, with normal excursions and percussion.  Breasts: defer Abdomen: Soft, left upper quadrant tenderness, no guarding, rebound, hernias, masses, or organomegaly.  Lymphatics: Non tender without lymphadenopathy.  Genitourinary: defer Musculoskeletal: Full ROM all peripheral extremities,5/5 strength, and normal gait. Right medial joint line tenderness of knee, no effusion.  Skin: + varicose veins bilateral legs,no erythema, swelling, warmth. Patient in compression socks today. Warm, dry without rashes, lesions,  ecchymosis. Neuro: Cranial nerves intact, reflexes equal bilaterally. Normal muscle tone, no cerebellar symptoms. Sensation intact.  Psych: Awake and oriented X 3, normal affect, Insight and Judgment appropriate.   EKG: WNL, no ST changes AORTA SCAN: defer  Vicie Mutters 3:28 PM [facility]

## 2019-08-08 ENCOUNTER — Other Ambulatory Visit: Payer: Self-pay

## 2019-08-08 ENCOUNTER — Encounter: Payer: Self-pay | Admitting: Physician Assistant

## 2019-08-08 ENCOUNTER — Ambulatory Visit: Payer: 59 | Admitting: Physician Assistant

## 2019-08-08 VITALS — BP 128/74 | HR 65 | Temp 97.5°F | Ht 66.0 in | Wt 177.0 lb

## 2019-08-08 DIAGNOSIS — Z0000 Encounter for general adult medical examination without abnormal findings: Secondary | ICD-10-CM

## 2019-08-08 DIAGNOSIS — E559 Vitamin D deficiency, unspecified: Secondary | ICD-10-CM

## 2019-08-08 DIAGNOSIS — Z0001 Encounter for general adult medical examination with abnormal findings: Secondary | ICD-10-CM

## 2019-08-08 DIAGNOSIS — R140 Abdominal distension (gaseous): Secondary | ICD-10-CM

## 2019-08-08 DIAGNOSIS — R911 Solitary pulmonary nodule: Secondary | ICD-10-CM | POA: Diagnosis not present

## 2019-08-08 DIAGNOSIS — M545 Low back pain, unspecified: Secondary | ICD-10-CM

## 2019-08-08 DIAGNOSIS — N189 Chronic kidney disease, unspecified: Secondary | ICD-10-CM

## 2019-08-08 DIAGNOSIS — N809 Endometriosis, unspecified: Secondary | ICD-10-CM

## 2019-08-08 DIAGNOSIS — K769 Liver disease, unspecified: Secondary | ICD-10-CM

## 2019-08-08 DIAGNOSIS — E782 Mixed hyperlipidemia: Secondary | ICD-10-CM

## 2019-08-08 DIAGNOSIS — B372 Candidiasis of skin and nail: Secondary | ICD-10-CM

## 2019-08-08 DIAGNOSIS — Z131 Encounter for screening for diabetes mellitus: Secondary | ICD-10-CM

## 2019-08-08 DIAGNOSIS — K449 Diaphragmatic hernia without obstruction or gangrene: Secondary | ICD-10-CM

## 2019-08-08 DIAGNOSIS — Z130 Encounter for screening for diseases of the blood and blood-forming organs and certain disorders involving the immune mechanism: Secondary | ICD-10-CM

## 2019-08-08 DIAGNOSIS — D6851 Activated protein C resistance: Secondary | ICD-10-CM

## 2019-08-08 DIAGNOSIS — K5792 Diverticulitis of intestine, part unspecified, without perforation or abscess without bleeding: Secondary | ICD-10-CM

## 2019-08-08 DIAGNOSIS — I10 Essential (primary) hypertension: Secondary | ICD-10-CM

## 2019-08-08 DIAGNOSIS — R932 Abnormal findings on diagnostic imaging of liver and biliary tract: Secondary | ICD-10-CM

## 2019-08-08 DIAGNOSIS — Z79899 Other long term (current) drug therapy: Secondary | ICD-10-CM

## 2019-08-08 DIAGNOSIS — R7989 Other specified abnormal findings of blood chemistry: Secondary | ICD-10-CM

## 2019-08-08 DIAGNOSIS — K7581 Nonalcoholic steatohepatitis (NASH): Secondary | ICD-10-CM

## 2019-08-08 MED ORDER — METOCLOPRAMIDE HCL 5 MG PO TABS: 5.0000 mg | ORAL_TABLET | Freq: Three times a day (TID) | ORAL | 1 refills | Status: DC

## 2019-08-08 MED ORDER — KETOCONAZOLE 2 % EX CREA: 1.0000 "application " | TOPICAL_CREAM | Freq: Two times a day (BID) | CUTANEOUS | 0 refills | Status: DC

## 2019-08-08 MED ORDER — TRIAMCINOLONE ACETONIDE 0.5 % EX CREA: 1.0000 "application " | TOPICAL_CREAM | Freq: Two times a day (BID) | CUTANEOUS | 2 refills | Status: DC

## 2019-08-08 NOTE — Patient Instructions (Addendum)
Use both of the creams twice a day for at least a month for fungus.   Use the reglan AS needed, 30 mins before food, do not use longer than 3 months.   Please schedule with Dr. Havery Moros follow up  We will be setting up an MRI for your liver, I will give you an anxiety medication before.   Low-Fiber Eating Plan Fiber is found in fruits, vegetables, whole grains, and beans. Eating a diet low in fiber helps to reduce how often you have bowel movements and how much you produce during a bowel movement. A low-fiber eating plan may help your digestive system heal if:  You have certain conditions, such as Crohn's disease or diverticulitis.  You recently had radiation therapy on your pelvis or bowel.  You recently had intestinal surgery.  You have a new surgical opening in your abdomen (colostomy or ileostomy).  Your intestine is narrowed (stricture). Your health care provider will determine how long you need to stay on this diet. Your health care provider may recommend that you work with a diet and nutrition specialist (dietitian). What are tips for following this plan? General guidelines  Follow recommendations from your dietitian about how much fiber you should have each day.  Most people on this eating plan should try to eat less than 10 grams (g) of fiber each day. Your daily fiber goal is _________________ g.  Take vitamin and mineral supplements as told by your health care provider or dietitian. Chewable or liquid forms are best when on this eating plan. Reading food labels  Check food labels for the amount of dietary fiber.  Choose foods that have less than 2 grams of fiber in one serving. Cooking  Use white flour and other allowed grains for baking and cooking.  Cook meat using methods that keep it tender, such as braising or poaching.  Cook eggs until the yolk is completely solid.  Cook with healthy oils, such as olive oil or canola oil. Meal planning   Eat 5-6 small  meals throughout the day instead of 3 large meals.  If you are lactose intolerant: ? Choose low-lactose dairy foods. ? Do not eat dairy foods, if told by your dietitian.  Limit fat and oils to less than 8 teaspoons a day.  Eat small portions of desserts. What foods are allowed? The items listed below may not be a complete list. Talk with your dietitian about what dietary choices are best for you. Grains All bread and crackers made with white flour. Waffles, pancakes, and [place] toast. Bagels. Pretzels. Melba toast, zwieback, and matzoh. Cooked and dried cereals that do not contain whole grains, added fiber, seeds, or dried fruit. CornmealDomenick Gong. Hot and cold cereals made with refined corn, wheat, rice, or oats. Plain pasta and noodles. White rice. Vegetables Well-cooked or canned vegetables without skin, seeds, or stems. Cooked potatoes without skins. Vegetable juice. Fruits Soft-cooked or canned fruits without skin and seeds. Peeled ripe banana. Applesauce. Fruit juice without pulp. Meats and other protein foods Ground meat. Tender cuts of meat or poultry. Eggs. Fish, seafood, and shellfish. Smooth nut butters. Tofu. Dairy All milk products and drinks. Lactose-free milks, including rice, soy, and almond milks. Yogurt without fruit, nuts, chocolate, or granola mix-ins. Sour cream. Cottage cheese. Cheese. Beverages Decaf coffee. Fruit and vegetable juices or smoothies (in small amounts, with no pulp or skins, and with fruits from allowed list). Sports drinks. Herbal tea. Fats and oils Olive oil, canola oil, sunflower oil, flaxseed  oil, and grapeseed oil. Mayonnaise. Cream cheese. Margarine. Butter. Sweets and desserts Plain cakes and cookies. Cream pies and pies made with allowed fruits. Pudding. Custard. Fruit gelatin. Sherbet. Popsicles. Ice cream without nuts. Plain hard candy. Honey. Jelly. Molasses. Syrups, including chocolate syrup. Chocolate. Marshmallows. Gumdrops. Seasoning and  other foods Bouillon. Broth. Cream soups made from allowed foods. Strained soup. Casseroles made with allowed foods. Ketchup. Mild mustard. Mild salad dressings. Plain gravies. Vinegar. Spices in moderation. Salt. Sugar. What foods are not allowed? The items listed below may not be a complete list. Talk with your dietitian about what dietary choices are best for you. Grains Whole wheat and whole grain breads and crackers. Multigrain breads and crackers. Rye bread. Whole grain or multigrain cereals. Cereals with nuts, raisins, or coconut. Bran. Coarse wheat cereals. Granola. High-fiber cereals. Cornmeal or corn bread. Whole grain pasta. Wild or brown rice. Quinoa. Popcorn. Buckwheat. Wheat germ. Vegetables Potato skins. Raw or undercooked vegetables. All beans and bean sprouts. Cooked greens. Corn. Peas. Cabbage. Beets. Broccoli. Brussels sprouts. Cauliflower. Mushrooms. Onions. Peppers. Parsnips. Okra. Sauerkraut. Fruit Raw or dried fruit. Berries. Fruit juice with pulp. Prune juice. Meats and other protein foods Tough, fibrous meats with gristle. Fatty meat. Poultry with skin. Fried meat, Sales executive, or fish. Deli or lunch meats. Sausage, bacon, and hot dogs. Nuts and chunky nut butter. Dried peas, beans, and lentils. Dairy Yogurt with fruit, nuts, chocolate, or granola mix-ins. Beverages Caffeinated coffee and teas. Fats and oils Avocado. Coconut. Sweets and desserts Desserts, cookies, or candies that contain nuts or coconut. Dried fruit. Jams and preserves with seeds. Marmalade. Any dessert made with fruits or grains that are not allowed. Seasoning and other foods Corn tortilla chips. Soups made with vegetables or grains that are not allowed. Relish. Horseradish. Angie Fava. Olives. Summary  Most people on a low-fiber eating plan should eat less than 10 grams of fiber a day. Follow recommendations from your dietitian about how much fiber you should have each day.  Always check food labels to  see the dietary fiber content of packaged foods. In general, a low-fiber food will have fewer than 2 grams of fiber per serving.  In general, try to avoid whole grains, raw fruits and vegetables, dried fruit, tough cuts of meat, nuts, and seeds.  Take a vitamin and mineral supplement as told by your health care provider or dietitian. This information is not intended to replace advice given to you by your health care provider. Make sure you discuss any questions you have with your health care provider. Document Revised: 07/27/2018 Document Reviewed: 06/07/2016 Elsevier Patient Education  2020 [employer].   Skin Yeast Infection  A skin yeast infection is a condition in which there is an overgrowth of yeast (candida) that normally lives on the skin. This condition usually occurs in areas of the skin that are constantly warm and moist, such as the armpits or the groin. What are the causes? This condition is caused by a change in the normal balance of the yeast and bacteria that live on the skin. What increases the risk? You are more likely to develop this condition if you:  Are obese.  Are pregnant.  Take birth control pills.  Have diabetes.  Take antibiotic medicines.  Take steroid medicines.  Are malnourished.  Have a weak body defense system (immune system).  Are 87 years of age or older.  Wear tight clothing. What are the signs or symptoms? The most common symptom of this condition is itchiness in  the affected area. Other symptoms include:  Red, swollen area of the skin.  Bumps on the skin. How is this diagnosed?  This condition is diagnosed with a medical history and physical exam.  Your health care provider may check for yeast by taking light scrapings of the skin to be viewed under a microscope. How is this treated? This condition is treated with medicine. Medicines may be prescribed or be available over the counter. The medicines may be:  Taken by mouth  (orally).  Applied as a cream or powder to your skin. Follow these instructions at home:   Take or apply over-the-counter and prescription medicines only as told by your health care provider.  Maintain a healthy weight. If you need help losing weight, talk with your health care provider.  Keep your skin clean and dry.  If you have diabetes, keep your blood sugar under control.  Keep all follow-up visits as told by your health care provider. This is important. Contact a health care provider if:  Your symptoms go away and then return.  Your symptoms do not get better with treatment.  Your symptoms get worse.  Your rash spreads.  You have a fever or chills.  You have new symptoms.  You have new warmth or redness of your skin. Summary  A skin yeast infection is a condition in which there is an overgrowth of yeast (candida) that normally lives on the skin. This condition is caused by a change in the normal balance of the yeast and bacteria that live on the skin.  Take or apply over-the-counter and prescription medicines only as told by your health care provider.  Keep your skin clean and dry.  Contact a health care provider if your symptoms do not get better with treatment. This information is not intended to replace advice given to you by your health care provider. Make sure you discuss any questions you have with your health care provider. Document Revised: 08/22/2017 Document Reviewed: 08/22/2017 Elsevier Patient Education  [employer].

## 2019-08-09 LAB — CELIAC DISEASE COMPREHENSIVE PANEL WITH REFLEXES
(tTG) Ab, IgA: 1 U/mL
Immunoglobulin A: 87 mg/dL (ref 70–320)

## 2019-08-09 LAB — CBC WITH DIFFERENTIAL/PLATELET
Absolute Monocytes: 400 cells/uL (ref 200–950)
Basophils Absolute: 70 cells/uL (ref 0–200)
Basophils Relative: 1.3 %
Eosinophils Absolute: 211 cells/uL (ref 15–500)
Eosinophils Relative: 3.9 %
HCT: 43.7 % (ref 35.0–45.0)
Hemoglobin: 14.4 g/dL (ref 11.7–15.5)
Lymphs Abs: 1885 cells/uL (ref 850–3900)
MCH: 30.3 pg (ref 27.0–33.0)
MCHC: 33 g/dL (ref 32.0–36.0)
MCV: 92 fL (ref 80.0–100.0)
MPV: 11.1 fL (ref 7.5–12.5)
Monocytes Relative: 7.4 %
Neutro Abs: 2835 cells/uL (ref 1500–7800)
Neutrophils Relative %: 52.5 %
Platelets: 285 10*3/uL (ref 140–400)
RBC: 4.75 10*6/uL (ref 3.80–5.10)
RDW: 11.8 % (ref 11.0–15.0)
Total Lymphocyte: 34.9 %
WBC: 5.4 10*3/uL (ref 3.8–10.8)

## 2019-08-09 LAB — COMPLETE METABOLIC PANEL WITH GFR
AG Ratio: 2 (calc) (ref 1.0–2.5)
ALT: 33 U/L — ABNORMAL HIGH (ref 6–29)
AST: 24 U/L (ref 10–35)
Albumin: 4.7 g/dL (ref 3.6–5.1)
Alkaline phosphatase (APISO): 55 U/L (ref 37–153)
BUN: 19 mg/dL (ref 7–25)
CO2: 27 mmol/L (ref 20–32)
Calcium: 10.4 mg/dL (ref 8.6–10.4)
Chloride: 103 mmol/L (ref 98–110)
Creat: 0.71 mg/dL (ref 0.50–0.99)
GFR, Est African American: 106 mL/min/{1.73_m2} (ref >=60)
GFR, Est Non African American: 91 mL/min/{1.73_m2} (ref >=60)
Globulin: 2.4 g/dL (calc) (ref 1.9–3.7)
Glucose, Bld: 86 mg/dL (ref 65–99)
Potassium: 4.2 mmol/L (ref 3.5–5.3)
Sodium: 141 mmol/L (ref 135–146)
Total Bilirubin: 0.6 mg/dL (ref 0.2–1.2)
Total Protein: 7.1 g/dL (ref 6.1–8.1)

## 2019-08-09 LAB — TSH: TSH: 2.43 mIU/L (ref 0.40–4.50)

## 2019-08-09 LAB — URINALYSIS, ROUTINE W REFLEX MICROSCOPIC
Bilirubin Urine: NEGATIVE
Glucose, UA: NEGATIVE
Hgb urine dipstick: NEGATIVE
Ketones, ur: NEGATIVE
Leukocytes,Ua: NEGATIVE
Nitrite: NEGATIVE
Protein, ur: NEGATIVE
Specific Gravity, Urine: 1.014 (ref 1.001–1.03)
pH: 7 (ref 5.0–8.0)

## 2019-08-09 LAB — LIPID PANEL
Cholesterol: 226 mg/dL — ABNORMAL HIGH (ref <200)
HDL: 49 mg/dL — ABNORMAL LOW (ref >=50)
LDL Cholesterol (Calc): 147 mg/dL (calc) — ABNORMAL HIGH
Non-HDL Cholesterol (Calc): 177 mg/dL (calc) — ABNORMAL HIGH (ref <130)
Total CHOL/HDL Ratio: 4.6 (calc) (ref <5.0)
Triglycerides: 161 mg/dL — ABNORMAL HIGH (ref <150)

## 2019-08-09 LAB — MICROALBUMIN / CREATININE URINE RATIO
Creatinine, Urine: 57 mg/dL (ref 20–275)
Microalb, Ur: <0.2 mg/dL

## 2019-08-09 LAB — HEMOGLOBIN A1C
Hgb A1c MFr Bld: 5.6 % of total Hgb (ref <5.7)
Mean Plasma Glucose: 114 (calc)
eAG (mmol/L): 6.3 (calc)

## 2019-08-09 LAB — VITAMIN D 25 HYDROXY (VIT D DEFICIENCY, FRACTURES): Vit D, 25-Hydroxy: 43 ng/mL (ref 30–100)

## 2019-08-09 LAB — IRON, TOTAL/TOTAL IRON BINDING CAP
%SAT: 19 % (calc) (ref 16–45)
Iron: 57 ug/dL (ref 45–160)
TIBC: 301 mcg/dL (calc) (ref 250–450)

## 2019-08-09 LAB — MAGNESIUM: Magnesium: 2.4 mg/dL (ref 1.5–2.5)

## 2019-08-09 LAB — VITAMIN B12: Vitamin B-12: 493 pg/mL (ref 200–1100)

## 2019-08-13 ENCOUNTER — Ambulatory Visit: Payer: 59 | Admitting: Orthopaedic Surgery

## 2019-08-22 ENCOUNTER — Other Ambulatory Visit: Payer: Self-pay | Admitting: Physician Assistant

## 2019-08-22 ENCOUNTER — Telehealth: Payer: Self-pay | Admitting: Physician Assistant

## 2019-08-22 MED ORDER — PREDNISONE 20 MG PO TABS: ORAL_TABLET | ORAL | 0 refills | Status: DC

## 2019-08-22 MED ORDER — VALACYCLOVIR HCL 1 G PO TABS: 1000.0000 mg | ORAL_TABLET | Freq: Three times a day (TID) | ORAL | 0 refills | Status: DC

## 2019-08-22 MED ORDER — LORAZEPAM 1 MG PO TABS: ORAL_TABLET | ORAL | 0 refills | Status: DC

## 2019-08-22 NOTE — Telephone Encounter (Signed)
-----   Message from Elenor Quinones, [address] sent at 08/21/2019 12:05 PM EDT ----- Regarding: per pt/office note Contact: [phone] Per patient: has MRI on 5 15.2021  Would like you to call something into her pharmacy to help keep her calm during this MRI.  [employer]/ [place]

## 2019-08-29 ENCOUNTER — Ambulatory Visit: Payer: 59 | Admitting: Nurse Practitioner

## 2019-08-29 ENCOUNTER — Encounter: Payer: Self-pay | Admitting: Nurse Practitioner

## 2019-08-29 ENCOUNTER — Other Ambulatory Visit: Payer: Self-pay

## 2019-08-29 VITALS — BP 124/86 | HR 64 | Temp 97.4°F | Ht 66.0 in | Wt 173.8 lb

## 2019-08-29 DIAGNOSIS — K5792 Diverticulitis of intestine, part unspecified, without perforation or abscess without bleeding: Secondary | ICD-10-CM

## 2019-08-29 NOTE — Patient Instructions (Signed)
If you are age 62 or older, your body mass index should be between 23-30. Your Body mass index is 28.05 kg/m. If this is out of the aforementioned range listed, please consider follow up with your Primary Care Provider.  If you are age 31 or younger, your body mass index should be between 19-25. Your Body mass index is 28.05 kg/m. If this is out of the aformentioned range listed, please consider follow up with your Primary Care Provider.   Please call the office if you have any dark stools or current left lower quadrant abdominal pain.

## 2019-08-29 NOTE — Progress Notes (Signed)
Agree with assessment and plan as outlined.  

## 2019-08-29 NOTE — Progress Notes (Signed)
IMPRESSION and PLAN:    64 year old female with PMH significant for hyperlipidemia , , GERD, colon polyps, diverticulitis, endometriosis , factor V Leiden, [place], hysterectomy, cholecystectomy, chronic low back pain   #  Acute diverticulitis, resolved --Documented by CT scan in ED on 07/26/19.  --Resolved with 10 days of antibiotics --No need for repeat colonoscopy.  She has known left-sided diverticular disease seen on last colonoscopy Sept 2019 --Advised patient that should she have recurrent symptoms then will need to put herself on clear liquid diet and call our office.    # Black stools in April.   --Resolved over a month ago --Etiology unclear.  She had been taking NSAIDs  (now discontinued ). Her hemoglobin on the day she started having black stool 07/26/19 was in the 14 range and 2 weeks later it was even higher at 14.4. Took what sounds like a PPI for two weeks after black stool started.   She is heme-negative today.  --Patient will call us should she see any stools that are darker than normal.  I told her we would have a low threshold for proceeding with an EGD . Stool light brown now, heme negative today --Avoid NSAIDS   # Abnormal MRI --Vague area of low density in the left hepatic lobe lateral segment measuring 1.4 x 1.1 cm. Does not meet criteria for cyst or hemangioma. Subtle 6 mm area of low attenuation also in the posterior right hepatic lobe. --PCP has already ordered MRI to better characterize lesions.  --I asked patient to call us a couple of days after MRI is done and we will review MRI        HPI:    Primary GI: Perry Cellar, MD   Chief complaint : diverticulitis  64 year old female with PMH as listed above.  Patient has been having lower abdominal pain for months.  There was concern for endometriosis so she had pelvic ultrasounds which were unrevealing.  Saw PCP on 07/26/19 with black stool / progressive LLQ pain, nausea. Ended up being taken to  ED   ED on 07/26/19 - CBC normal. CMP unremarkable except ALT 33.  Found on CT scan to have left-sided diverticulitis.  Discharged from the ED with 10 days of Cipro and Flagyl which she completed.   CT scan  abd/ pelvis with contrast  IMPRESSION: 1. Findings consistent with acute diverticulitis of the descending/sigmoid colon. No abscess or free air. Given segmental thickening would suggest correlation with recent colonoscopy results or follow-up colonoscopy as warranted. 2. Vague area of low density in the left hepatic lobe lateral segment measuring 1.4 x 1.1 cm. Does not meet criteria for cyst or hemangioma. Subtle 6 mm area of low attenuation also in the posterior right hepatic lobe. When the patient is clinically stable and able to follow directions and hold their breath (preferably as an outpatient) further evaluation with dedicated abdominal MRI should be considered. 3. These results were called by telephone at the time of interpretation on 07/26/2019 at 5:52 pm to provider SCOTT   08/08/19 -follow up with PCP.  Repeat CBC normal. Hgb even better at 14.4. MRI ordered for further characterization of liver lesion seen on CT scan.  Patient complained of bloating.  She was prescribed Reglan. Labs to rule out celiac disease were obtained and negative   Interim History: Lower abdominal pain has resolved since completion of antibiotics.  While on antibiotics patient developed a rash under breast.  Prescribed triamcinolone cream.  Patient subsequently broke out in blisters under breast.  She was then treated with prednisone and given med for ? shingles.   Patient says the black stool started the day she went to ED and continued for a few days afterwards. She had not been taking Pepto-Bismol or iron . She had been taking Aleve since February. She says PCP prescribed something for possible ulcer.  I am unable to find documentation of what she was given but patient took it for 2 weeks.  Her stools  have been back to normal color for over a month.    Patient has a history of chronic LUQ discomfort present for years.  It is unrelated to eating.  Unrelated to position or movement.  She complains of upper abdominal bloating.  Generally wakes up flat and starts bloating as the day goes on   Previous Endoscopic Evaluations:  12/25/17 Colonoscopy for history of colon polyps  The examined portion of the ileum was normal. - One 3 mm polyp at the ileocecal valve, removed with a cold biopsy forceps. Resected and retrieved. - One 3 mm polyp in the transverse colon, removed with a cold biopsy forceps. Resected and retrieved. - Diverticulosis in the sigmoid colon. - Tortuous colon. - The examination was otherwise normal.  Path - tubular adenoma without HGD   Review of systems:     No chest pain, no SOB, no fevers, no urinary sx   Past Medical History:  Diagnosis Date  . Allergy   . Cataract   . Colon polyps   . Elevated cholesterol   . Endometriosis 02/02/2017  . Factor V Leiden ([place]) 02/02/2017  . GERD (gastroesophageal reflux disease)   . IBS (irritable bowel syndrome)   . Liver hemangioma 02/02/2017  . NASH (nonalcoholic steatohepatitis) 02/02/2017    Patient's surgical history, family medical history, social history, medications and allergies were all reviewed in Epic   Serum creatinine: 0.71 mg/dL 08/08/19 1605 Estimated creatinine clearance: 77.2 mL/min  Current Outpatient Medications  Medication Sig Dispense Refill  . Ascorbic Acid (VITAMIN C PO) Take 500-1,000 Units by mouth daily.    Marland Kitchen b complex vitamins tablet Take 1 tablet by mouth daily.    Marland Kitchen BIOTIN PO Take 5,000 mcg by mouth daily.     . Cholecalciferol (VITAMIN D) 2000 units tablet Take 4,000 Units by mouth daily.     . cyclobenzaprine (FLEXERIL) 10 MG tablet Take 1 tablet (10 mg total) by mouth at bedtime as needed for muscle spasms. 90 tablet 0  . famotidine (PEPCID) 40 MG tablet Take 1 tablet (40 mg total) by  mouth every evening. 30 tablet 1  . hydrochlorothiazide (HYDRODIURIL) 12.5 MG tablet Take 1-2 tablets once daily as needed for swelling 30 tablet 0  . Hyoscyamine Sulfate SL (LEVSIN/SL) 0.125 MG SUBL Place 0.125 mg under the tongue every 4 (four) hours as needed. 30 tablet 0  . LORazepam (ATIVAN) 1 MG tablet 1/2-1 tablet once prior to procedure, do not drive 5 tablet 0  . Magnesium 400 MG TABS Take 400 mg by mouth daily.     . metoCLOPramide (REGLAN) 5 MG tablet Take 1 tablet (5 mg total) by mouth 3 (three) times daily. 90 tablet 1  . Multiple Vitamin (MULTIVITAMIN PO) Take 1 capsule by mouth daily. Nature Bounty 100% fruit & vegetable.    . ondansetron (ZOFRAN) 8 MG tablet 1/2-1 tablet as needed for nausea and vomiting every 6 hours. 28 tablet 1  . polyethylene glycol (MIRALAX / GLYCOLAX) 17  g packet Take 17 g by mouth daily as needed for mild constipation.     . Probiotic Product (ALIGN) 4 MG CAPS Take 1 capsule by mouth at bedtime.     . thiamine (VITAMIN B-1) 100 MG tablet Take 100 mg by mouth every other day.     . Turmeric (QC TUMERIC COMPLEX PO) Take 1 capsule by mouth daily.     Marland Kitchen ZINC-VITAMIN C PO Take 1 tablet by mouth daily.      No current facility-administered medications for this visit.    Physical Exam:     BP 124/86   Pulse 64   Temp (!) 97.4 F (36.3 C)   Ht 5' 6"  (1.676 m)   Wt 173 lb 12.8 oz (78.8 kg)   BMI 28.05 kg/m   GENERAL:  Pleasant female in NAD PSYCH: : Cooperative, normal affect CARDIAC:  RRR PULM: Normal respiratory effort, lungs CTA bilaterally, no wheezing ABDOMEN:  Nondistended, soft, nontender. No obvious masses, no hepatomegaly,  normal bowel sounds SKIN:  turgor, no lesions seen. No blisters / shingle like lesions under breast seen today Musculoskeletal:  Normal muscle tone, normal strength NEURO: Alert and oriented x 3, no focal neurologic deficits  I spent 30 minutes total reviewing records, obtaining history, performing exam, counseling  patient and documenting visit / findings.   Tye Savoy , NP 08/29/2019, 9:03 AM

## 2019-08-30 ENCOUNTER — Other Ambulatory Visit: Payer: Self-pay

## 2019-08-30 ENCOUNTER — Ambulatory Visit: Payer: 59 | Admitting: Adult Health

## 2019-08-30 ENCOUNTER — Encounter: Payer: Self-pay | Admitting: Adult Health

## 2019-08-30 VITALS — BP 108/80 | HR 82 | Temp 97.2°F | Resp 16 | Wt 173.6 lb

## 2019-08-30 DIAGNOSIS — R21 Rash and other nonspecific skin eruption: Secondary | ICD-10-CM

## 2019-08-30 DIAGNOSIS — B372 Candidiasis of skin and nail: Secondary | ICD-10-CM | POA: Diagnosis not present

## 2019-08-30 NOTE — Progress Notes (Signed)
Assessment and Plan:  Samantha Mckinney was seen today for rash.  Diagnoses and all orders for this visit:  Candidal intertrigo L breast Discussed need to keep dry, avoid allowing skin to adhere; regular showers, 100% cotton bra, dry thoroughly after cleansing, can apply paper towel or cotton wash cloth in between fold when at home Advsied this may be recurrent, typically worse during hot/humid seasons "burning" per HPI seems possibly had allergic reaction to one of the topicals that was prescribed rather than shingles Cautiously try only ketoconazole BID sparingly - if causes worsening sx stop, rinse well, then try steroid only vs lifestyle only interventions Follow up if needed  Rash and nonspecific skin eruption New this AM, symmetrical Under bilateral arms, appears possible med reaction vs to new supplement  Has completed abx/antiviral, advised to stop supplement Monitor sx, suggested she add zyrtec to monitor Follow up as needed Go to the ER if any chest pain, shortness of breath, nausea, dizziness, severe HA, changes vision/speech  Further disposition pending results of labs. Discussed med's effects and SE's.   Over 15 minutes of exam, counseling, chart review, and critical decision making was performed.   Future Appointments  Date Time Provider [place]  08/31/2019  9:40 AM GI-315 MR 1 [id number] [facility] [address] [facility]  11/11/2019  2:30 PM Vicie Mutters, PA-C [facility] None  08/10/2020  3:00 PM Vicie Mutters, PA-C [facility] None    ------------------------------------------------------------------------------------------------------------------   HPI BP 108/80   Pulse 82   Temp (!) 97.2 F (36.2 C)   Resp 16   Wt 173 lb 9.6 oz (78.7 kg)   SpO2 97%   BMI 28.02 kg/m   64 y.o. female, presents for follow up on persistent rash under left breast ongoing since prior to 08/08/2019, at which time she was diagnosed with yeast dermatitis, was prescribed topical ketoconazole and kenalog  BID. She called to report she developed burning pain and blisters, was advised to stop topicals and was initiated on antiviral and steroid taper which she completed yesterday. She reports burning is improved, remains erythmatous and somewhat uncomfortable. NO distinct blisters.   She also reports woke up this AM and noted new rash under bilateral arms. Denies itching/burning, distinct lesions. Repots had some nausea and GI sx last night, imporved this AM (recently with lots of GI problems - following with Dr. Havery Mckinney). Has zofran/levsin but states didn't think to use. She denies husband having any rash, denies new medications except above, did start on new "all natural" fruits and veggie powdered supplement 1 month ago.   Had CT for diverticulitis flare 07/26/2019, known hemangiomas but showed:   Hepatobiliary: Vague area of low density in the left hepatic lobe lateral segment (image 14, series 3) 1.4 x 1.1 cm. Does not meet criteria for cyst or hemangioma.  Subtle area of low attenuation also posterior right hepatic lobe measuring 6 mm. Lobular hepatic contours. Post cholecystectomy without evidence of biliary ductal dilation.  Was recommended for MRI follow up, has MR ab w/o contrast ordered and scheduled for tomorrow.  Lab Results  Component Value Date   ALT 33 (H) 08/08/2019   AST 24 08/08/2019   ALKPHOS 55 07/26/2019   BILITOT 0.6 08/08/2019     Past Medical History:  Diagnosis Date  . Allergy   . Cataract   . Colon polyps   . Elevated cholesterol   . Endometriosis 02/02/2017  . Factor V Leiden ([place]) 02/02/2017  . GERD (gastroesophageal reflux disease)   . IBS (irritable bowel syndrome)   .  Liver hemangioma 02/02/2017  . NASH (nonalcoholic steatohepatitis) 02/02/2017     Allergies  Allergen Reactions  . Morphine And Related Swelling    Current Outpatient Medications on File Prior to Visit  Medication Sig  . Ascorbic Acid (VITAMIN C PO) Take 500-1,000 Units by mouth  daily.  Marland Kitchen b complex vitamins tablet Take 1 tablet by mouth daily.  Marland Kitchen BIOTIN PO Take 5,000 mcg by mouth daily.   . Cholecalciferol (VITAMIN D) 2000 units tablet Take 4,000 Units by mouth daily.   . cyclobenzaprine (FLEXERIL) 10 MG tablet Take 1 tablet (10 mg total) by mouth at bedtime as needed for muscle spasms.  . famotidine (PEPCID) 40 MG tablet Take 1 tablet (40 mg total) by mouth every evening.  . hydrochlorothiazide (HYDRODIURIL) 12.5 MG tablet Take 1-2 tablets once daily as needed for swelling  . Hyoscyamine Sulfate SL (LEVSIN/SL) 0.125 MG SUBL Place 0.125 mg under the tongue every 4 (four) hours as needed.  Marland Kitchen LORazepam (ATIVAN) 1 MG tablet 1/2-1 tablet once prior to procedure, do not drive  . Magnesium 400 MG TABS Take 400 mg by mouth daily.   . metoCLOPramide (REGLAN) 5 MG tablet Take 1 tablet (5 mg total) by mouth 3 (three) times daily.  . Multiple Vitamin (MULTIVITAMIN PO) Take 1 capsule by mouth daily. Nature Bounty 100% fruit & vegetable.  . ondansetron (ZOFRAN) 8 MG tablet 1/2-1 tablet as needed for nausea and vomiting every 6 hours.  . polyethylene glycol (MIRALAX / GLYCOLAX) 17 g packet Take 17 g by mouth daily as needed for mild constipation.   . Probiotic Product (ALIGN) 4 MG CAPS Take 1 capsule by mouth at bedtime.   . thiamine (VITAMIN B-1) 100 MG tablet Take 100 mg by mouth every other day.   . Turmeric (QC TUMERIC COMPLEX PO) Take 1 capsule by mouth daily.   Marland Kitchen ZINC-VITAMIN C PO Take 1 tablet by mouth daily.    No current facility-administered medications on file prior to visit.    ROS: all negative except above.   Physical Exam:  BP 108/80   Pulse 82   Temp (!) 97.2 F (36.2 C)   Resp 16   Wt 173 lb 9.6 oz (78.7 kg)   SpO2 97%   BMI 28.02 kg/m   General Appearance: Well nourished, in no apparent distress. Eyes: PERRLA, conjunctiva no swelling or erythema ENT/Mouth:  No erythema, swelling, or exudate on post pharynx.  Tonsils not swollen or erythematous.  Hearing normal.  Neck: Supple, thyroid normal.  Respiratory: Respiratory effort normal, BS equal bilaterally without rales, rhonchi, wheezing or stridor.  Cardio: RRR with no MRGs. Brisk peripheral pulses without edema.  Abdomen: Soft, + BS.  Non tender, no guarding, rebound, hernias, masses. Lymphatics: Non tender without lymphadenopathy.  Musculoskeletal: Full ROM, 5/5 strength, normal gait.  Skin: Warm, dry; she has mildly erythematous symmetrical rash under left breast; no distinct lesions; flat/nonraised erythematous rash without distinct lesions or raised texture to bilateral inner upper arms sparing axilla (see photo of R below - similar to left)  Neuro: Normal muscle tone, Sensation intact.  Psych: Awake and oriented X 3, normal affect, Insight and Judgment appropriate.          Izora Ribas, NP 10:07 AM [facility]

## 2019-08-30 NOTE — Patient Instructions (Addendum)
Ketoconazole cream - anti yeast cream - try under breast twice daily   MOST IMPORTANT is keeping the area dry - cotton bra, rinse and dry well at home, try paper towels or cotton wash cloth under breasts at home to keep skin from touching    If ketoconazole makes it worse - allergic reaction - try the steroid cream (triamcinolone) instead or call for repeat steroid taper  Monitor rash under arms - message with progress  Try stopping the new supplement       Skin Yeast Infection  A skin yeast infection is a condition in which there is an overgrowth of yeast (candida) that normally lives on the skin. This condition usually occurs in areas of the skin that are constantly warm and moist, such as the armpits or the groin. What are the causes? This condition is caused by a change in the normal balance of the yeast and bacteria that live on the skin. What increases the risk? You are more likely to develop this condition if you:  Are obese.  Are pregnant.  Take birth control pills.  Have diabetes.  Take antibiotic medicines.  Take steroid medicines.  Are malnourished.  Have a weak body defense system (immune system).  Are 62 years of age or older.  Wear tight clothing. What are the signs or symptoms? The most common symptom of this condition is itchiness in the affected area. Other symptoms include:  Red, swollen area of the skin.  Bumps on the skin. How is this diagnosed?  This condition is diagnosed with a medical history and physical exam.  Your health care provider may check for yeast by taking light scrapings of the skin to be viewed under a microscope. How is this treated? This condition is treated with medicine. Medicines may be prescribed or be available over the counter. The medicines may be:  Taken by mouth (orally).  Applied as a cream or powder to your skin. Follow these instructions at home:   Take or apply over-the-counter and prescription medicines  only as told by your health care provider.  Maintain a healthy weight. If you need help losing weight, talk with your health care provider.  Keep your skin clean and dry.  If you have diabetes, keep your blood sugar under control.  Keep all follow-up visits as told by your health care provider. This is important. Contact a health care provider if:  Your symptoms go away and then return.  Your symptoms do not get better with treatment.  Your symptoms get worse.  Your rash spreads.  You have a fever or chills.  You have new symptoms.  You have new warmth or redness of your skin. Summary  A skin yeast infection is a condition in which there is an overgrowth of yeast (candida) that normally lives on the skin. This condition is caused by a change in the normal balance of the yeast and bacteria that live on the skin.  Take or apply over-the-counter and prescription medicines only as told by your health care provider.  Keep your skin clean and dry.  Contact a health care provider if your symptoms do not get better with treatment. This information is not intended to replace advice given to you by your health care provider. Make sure you discuss any questions you have with your health care provider. Document Revised: 08/22/2017 Document Reviewed: 08/22/2017 Elsevier Patient Education  [place].

## 2019-08-31 ENCOUNTER — Ambulatory Visit
Admission: RE | Admit: 2019-08-31 | Discharge: 2019-08-31 | Disposition: A | Discharge status: Home / Self Care | Payer: 59 | Source: Ambulatory Visit | Attending: Physician Assistant | Admitting: Physician Assistant

## 2019-08-31 DIAGNOSIS — K769 Liver disease, unspecified: Secondary | ICD-10-CM

## 2019-08-31 IMAGING — MR MR ABDOMEN WO/W CM
11 of 17 series · 24 of 48 positions shown · IV contrast (multihance)
Comparison: [DATE].

CLINICAL DATA: Evaluate liver lesions.

EXAM:
MRI ABDOMEN WITHOUT AND WITH CONTRAST
TECHNIQUE: Multiplanar multisequence MR imaging of the abdomen was performed
both before and after the administration of intravenous contrast.
CONTRAST:  16 mL Multihance

[Series 3: T2 · coronal · 5.0mm · 1.56mm/px · 1 of 30 slices shown (1 of 3)]
[im 1/30]
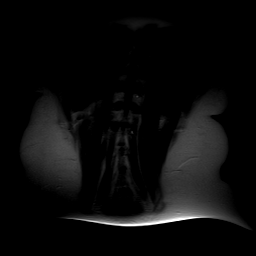

[Series 4: axial tru fisp · axial · 4.5mm · 1.48mm/px · 1 of 35 slices shown]
[im 1/35]
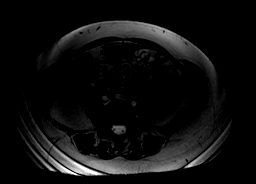

[Series 5: [id number]_[id number] · axial · 6.0mm · 1.98mm/px · z∈[-113,+129]mm · 2 of 96 slices shown]
[im 1/96]
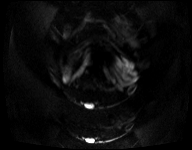
[im 96/96]
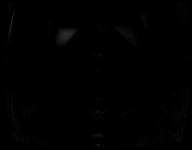

[Series 6: [id number]_adc · axial · 6.0mm · 1.98mm/px · 1 of 32 slices shown]
[im 1/32]
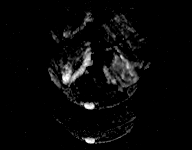

[Series 7: T2 · axial · 6.5mm · 0.74mm/px · 1 of 32 slices shown (2 of 3)]
[im 1/32]
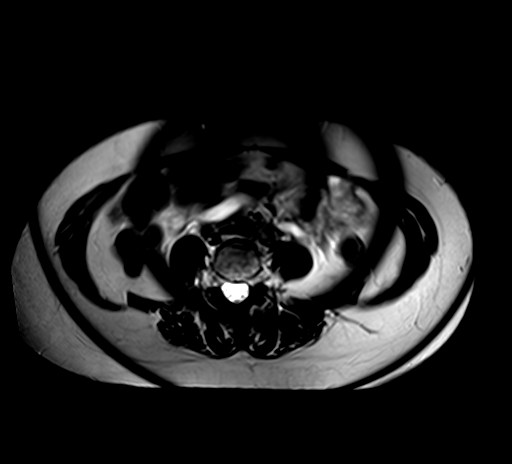

[Series 8: T2 · axial · 5.0mm · 1.37mm/px · 1 of 36 slices shown (3 of 3)]
[im 1/36]
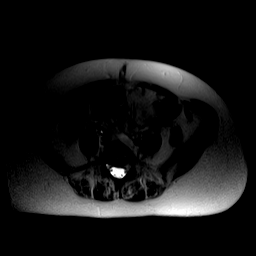

[Series 9: axial in out · axial · 5.5mm · 0.74mm/px · z∈[-132,+76]mm · 2 of 68 slices shown]
[im 1/68]
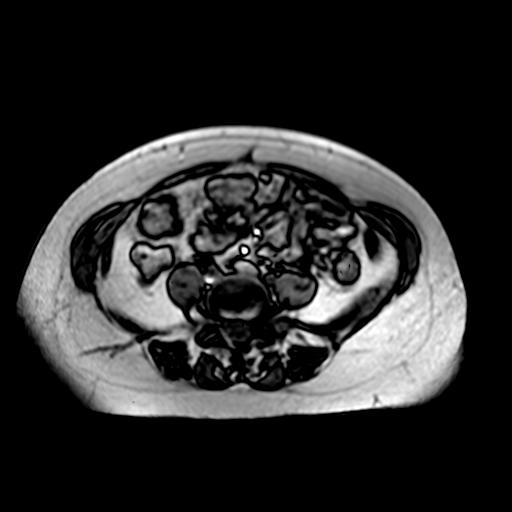
[im 68/68]
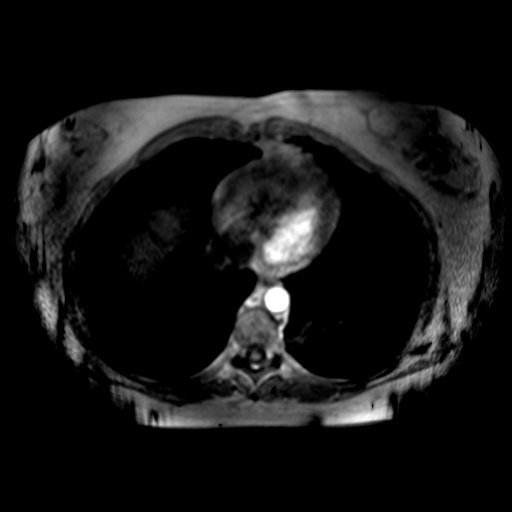

[Series 10: T1 dynamic · axial · 2.5mm · 0.78mm/px · z∈[-127,+71]mm · 3 of 80 slices shown (1 of 2)]
[im 1/80]
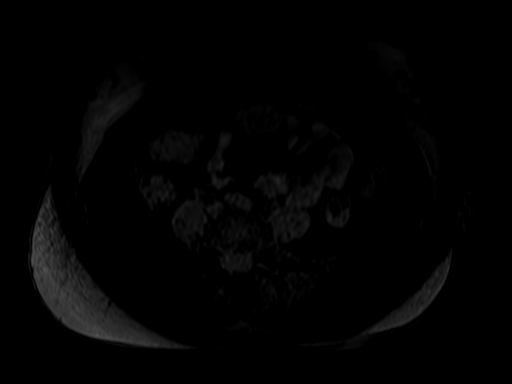
[im 40/80]
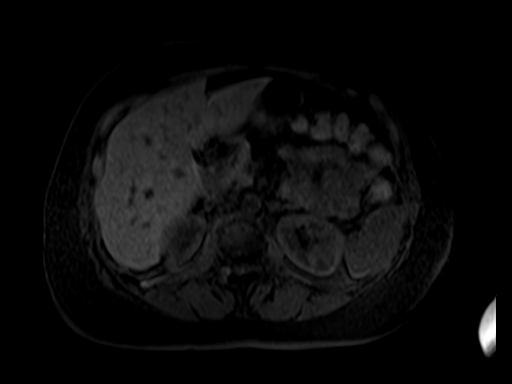
[im 80/80]
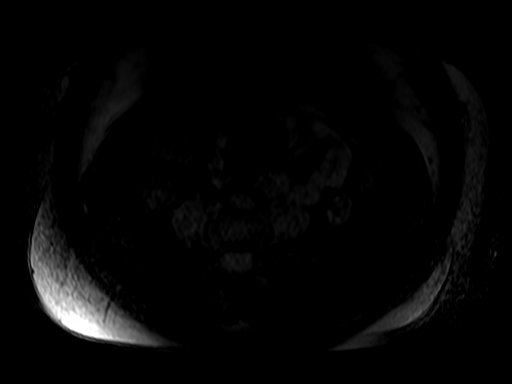

[Series 12: T1 dynamic · axial · non-contrast · 2.0mm · 0.78mm/px · z∈[-101,+121]mm · 4 of 112 slices shown (2 of 2)]
[im 1/112]
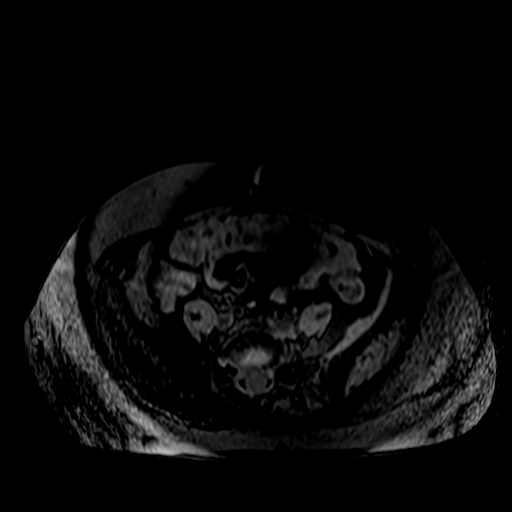
[im 38/112]
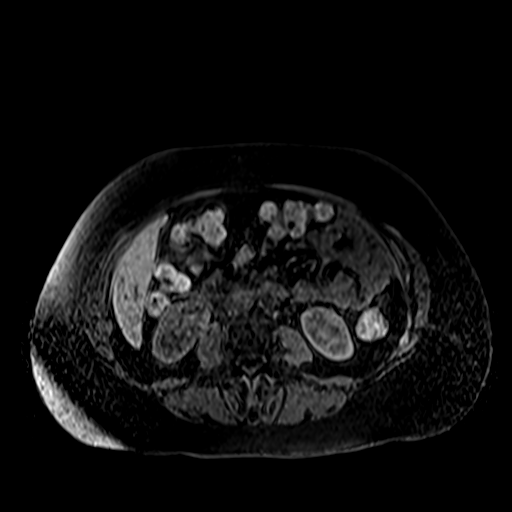
[im 75/112]
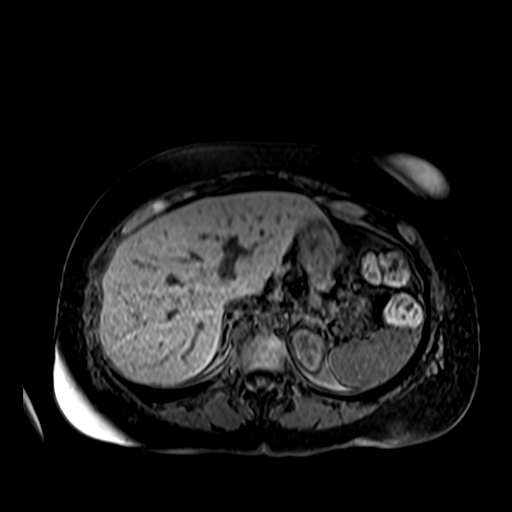
[im 112/112]
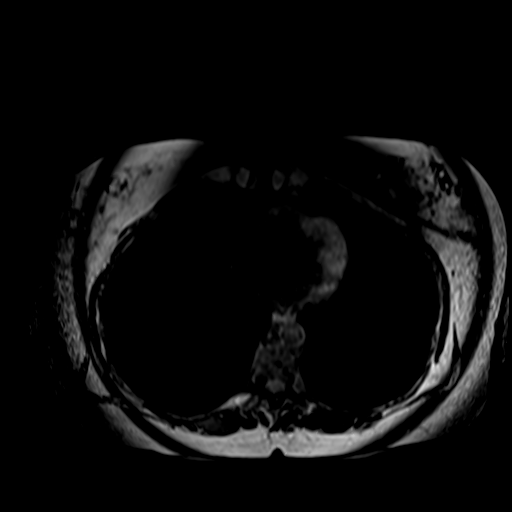

[Series 13: post 25 sec · axial · 2.0mm · 0.78mm/px · z∈[-101,+121]mm · 4 of 112 slices shown]
[im 1/112]
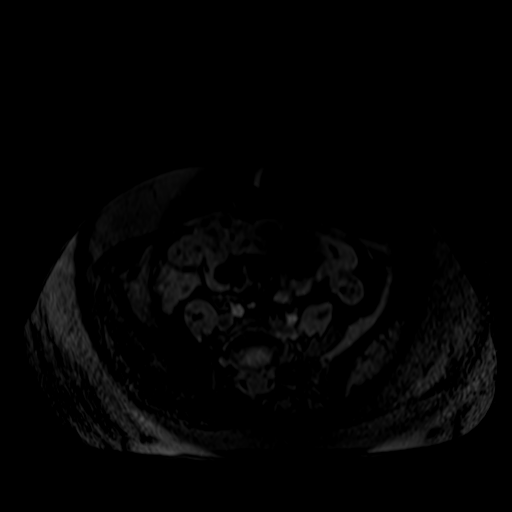
[im 38/112]
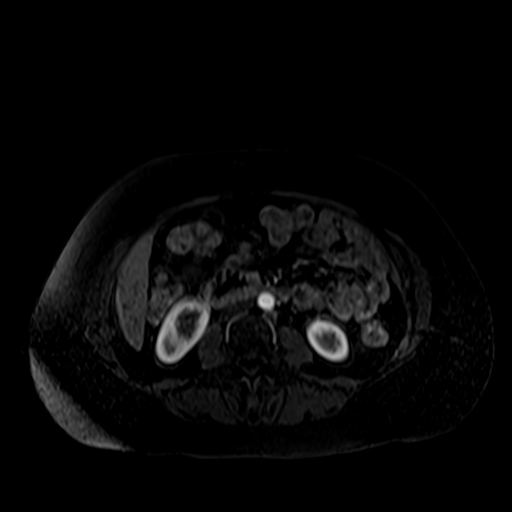
[im 75/112]
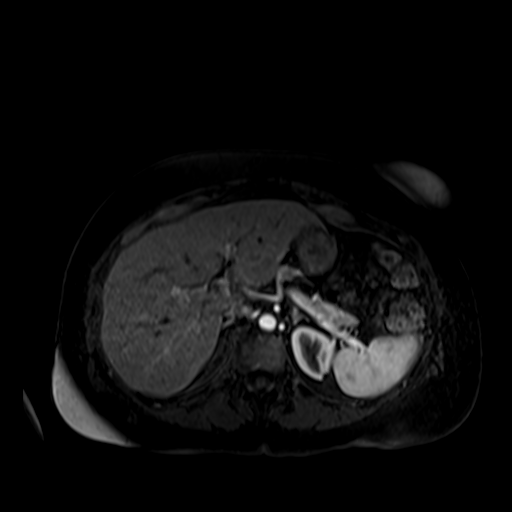
[im 112/112]
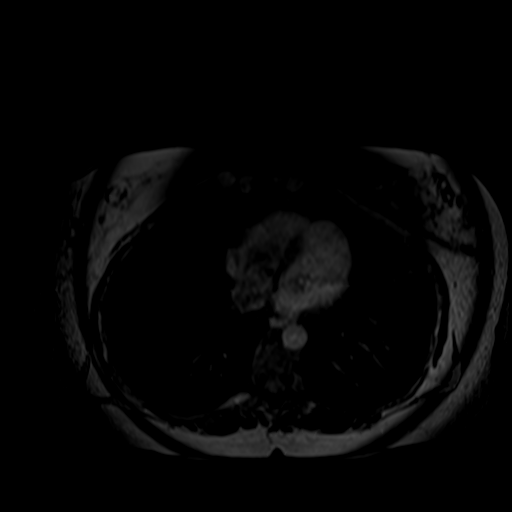

[Series 14: post 25 sec_sub · axial · 2.0mm · 0.78mm/px · z∈[-101,+121]mm · 4 of 112 slices shown]
[im 1/112]
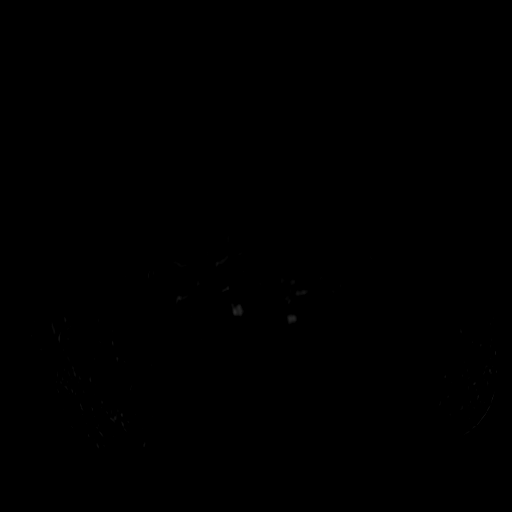
[im 38/112]
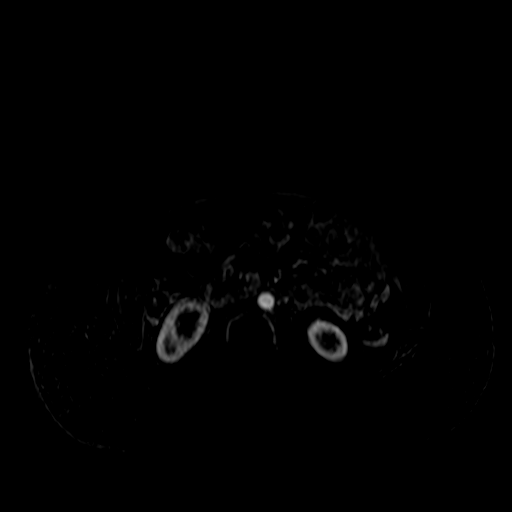
[im 75/112]
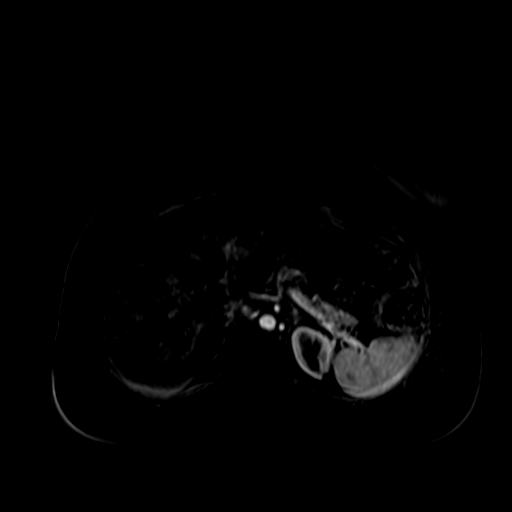
[im 112/112]
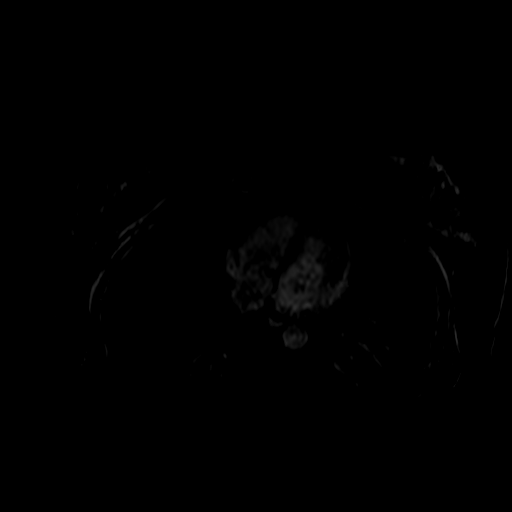

[24 of 48 positions shown; findings below may reference images not displayed]

FINDINGS: Lower chest: No acute findings.

Hepatobiliary: T2 hyperintense and T1 hypointense structure within
lateral segment of left lobe measures 1.2 by 0.7 cm (mean diameter 9
mm), image [DATE] and image 32/12. No internal enhancement identified.

Within the posterior right lobe there is a 6 mm T1 hypointense and
T2 hyperintense structure without convincing internal enhancement.
No focal enhancing liver lesions identified.

Cholecystectomy. No suspicious biliary abnormality.

Pancreas: No mass, inflammatory changes, or other parenchymal
abnormality identified.

Spleen:  Within normal limits in size and appearance.

Adrenals/Urinary Tract: No masses identified. No evidence of
hydronephrosis.

Stomach/Bowel: Visualized portions within the abdomen are
unremarkable.

Vascular/Lymphatic: No pathologically enlarged lymph nodes
identified. No abdominal aortic aneurysm demonstrated.

Other:  None.

Musculoskeletal: Curvature of the lumbar spine is convex towards the
right.
IMPRESSION: 1. The 2 lesions of concern within the posterior right hepatic lobe
and lateral segment of left lobe are technically too small to
reliably characterize. Favor benign cyst or biliary hamartoma.
Within the limitation of their small size no suspicious imaging
features are identified at this time without convincing evidence for
enhancement. According to consensus criteria in a patient that is at
low risk for malignancy, these most likely represent a benign
abnormality and no further follow-up is indicated at this time. This
recommendation follows ACR consensus guidelines: Management of
Incidental Liver Lesions on CT: A White Paper of the ACR Incidental
Findings Committee. [HOSPITAL] [O6];14:[PHONE_NUMBER]

## 2019-09-02 ENCOUNTER — Other Ambulatory Visit: Payer: Self-pay | Admitting: Physician Assistant

## 2019-09-02 DIAGNOSIS — K769 Liver disease, unspecified: Secondary | ICD-10-CM

## 2019-09-03 ENCOUNTER — Ambulatory Visit
Admission: RE | Admit: 2019-09-03 | Discharge: 2019-09-03 | Disposition: A | Discharge status: Home / Self Care | Payer: 59 | Source: Ambulatory Visit | Attending: Physician Assistant | Admitting: Physician Assistant

## 2019-09-03 ENCOUNTER — Other Ambulatory Visit: Payer: Self-pay

## 2019-09-03 ENCOUNTER — Other Ambulatory Visit: Payer: Self-pay | Admitting: Physician Assistant

## 2019-09-03 DIAGNOSIS — K769 Liver disease, unspecified: Secondary | ICD-10-CM

## 2019-09-03 MED ORDER — GADOBENATE DIMEGLUMINE 529 MG/ML IV SOLN
16.0000 mL | Freq: Once | INTRAVENOUS | Status: AC | PRN
  Administered 2019-09-03: 16 mL via INTRAVENOUS

## 2019-09-10 ENCOUNTER — Telehealth: Payer: Self-pay | Admitting: Nurse Practitioner

## 2019-09-10 NOTE — Telephone Encounter (Signed)
Pt stated that she has been getting bruises on her arms and left breast.  She inquired whether this is possibly GI related.  Pt is scheduled for blood work with PCP.

## 2019-09-11 ENCOUNTER — Other Ambulatory Visit: Payer: 59

## 2019-09-11 NOTE — Telephone Encounter (Signed)
The patient has been in touch with her PCP about this issue. She is trying to cover all the bases and is inquiring about her unexplained bruising and any GI diagnosis that could be connected.  She noticed a very dark bruise on her breast about 10 days ago. It was tender. No trauma. She then noted dark bruising on her arms about 5 days ago. Again no known trauma. She has stopped all supplements and vitamins. The bruising of her arms is not near near the IV for her MRI was placed. Any thoughts on this?

## 2019-09-12 NOTE — Telephone Encounter (Signed)
Message sent to the patient.

## 2019-09-12 NOTE — Telephone Encounter (Signed)
Beth, I do not know why she is bruising so much.  Her platelet count was normal in April.  I know she was taken NSAIDs but I think those were stopped several weeks ago.  Sorry nothing is coming to mind.  Sounds like her PCP is checking labs to figure things out. Thanks

## 2019-10-11 ENCOUNTER — Ambulatory Visit: Payer: 59 | Admitting: Physician Assistant

## 2019-10-15 NOTE — Progress Notes (Signed)
FOLLOW UP  Assessment and Plan: Spontaneous ecchymosis and swelling Will check liver labs, check protein, check inr, check iron No ecchymosis on labs today -     Protime-INR -     APTT -     Iron,Total/Total Iron Binding Cap -     Ferritin  Epigastric pain Stay off NSAIDS/off ETOH Get on protonix 40 mg BID for now Try the reglan as needed for nausea Follow up GI for EGD, rule out GERD, PUD, SBO Please go to the ER if you have any severe AB pain, unable to hold down food/water, blood in stool or vomit, chest pain, shortness of breath, or any worsening symptoms.   -     CBC with Differential/Platelet -     COMPLETE METABOLIC PANEL WITH GFR -     Magnesium -     Amylase -     Lipase -     pantoprazole (PROTONIX) 40 MG tablet; Take 1 tablet (40 mg total) by mouth 2 (two) times daily before a meal.  Encounter for screening for COVID-19 -     SARS CoV2 Serology(COVID19) AB(IgG,IgM),Immunoassay States she has been around a lot of people and wants screening   Discussed med's effects and SE's. Screening labs and tests as requested with regular follow-up as recommended. Over 40 minutes of exam, counseling, chart review, and complex, high level critical decision making was performed this visit.  Future Appointments  Date Time Provider [place]  11/15/2019  8:45 AM Vicie Mutters, PA-C [facility] None  08/10/2020  3:00 PM Vicie Mutters, PA-C [facility] None    HPI  64 y.o. female  presents for a complete physical and follow up for has Lung nodule; Factor V Leiden ([place]); Mixed hyperlipidemia; Elevated homocysteine; Vitamin D deficiency; Endometriosis; Abnormal liver CT; Hiatal hernia; CKD (chronic kidney disease); NASH (nonalcoholic steatohepatitis); and Low back pain on their problem list..   Most recently she had diverticulitis, treated with Cipro and flagyl. She has since seen GI on 05/13/without complaints.   Had colonoscopy 2019 with Dr. Havery Moros.  Had MRI of abnormal  liver lesion but it was benign appearing.  Negative celiac She has been off her supplements.  She has not taken NSAIDS in 2 months, no ETOH.  Did have 2 episodes of dark/black stool but has not had any more.  She states she is bruising randomly.   She has generalized swelling she states in hand, legs, AB. Feels her   She has been having AB bloating, epigastric pain, with eating sometimes she can not eat. She will go on liquid diet for days at a time. She has appointment with Dr. Chester Holstein in July.    She has history of NASH, CKD3 and elevated ANA and homocysteine.  Has had a negative connective tissue work up and has seen rheum.   BMI is Body mass index is 28.44 kg/m., she is working on diet and exercise. Wt Readings from Last 3 Encounters:  10/16/19 176 lb 3.2 oz (79.9 kg)  08/30/19 173 lb 9.6 oz (78.7 kg)  08/29/19 173 lb 12.8 oz (78.8 kg)     Lab Results  Component Value Date   GFRNONAA 91 08/08/2019    Current Medications:    Current Outpatient Medications (Cardiovascular):  .  hydrochlorothiazide (HYDRODIURIL) 12.5 MG tablet, Take 1-2 tablets once daily as needed for swelling     Current Outpatient Medications (Other):  Marland Kitchen  Ascorbic Acid (VITAMIN C PO), Take 500-1,000 Units by mouth daily. Marland Kitchen  b complex  vitamins tablet, Take 1 tablet by mouth daily. Marland Kitchen  BIOTIN PO, Take 5,000 mcg by mouth daily.  .  Cholecalciferol (VITAMIN D) 2000 units tablet, Take 4,000 Units by mouth daily.  .  cyclobenzaprine (FLEXERIL) 10 MG tablet, Take 1 tablet (10 mg total) by mouth at bedtime as needed for muscle spasms. .  famotidine (PEPCID) 40 MG tablet, Take 1 tablet (40 mg total) by mouth every evening. Marland Kitchen  Hyoscyamine Sulfate SL (LEVSIN/SL) 0.125 MG SUBL, Place 0.125 mg under the tongue every 4 (four) hours as needed. Marland Kitchen  LORazepam (ATIVAN) 1 MG tablet, 1/2-1 tablet once prior to procedure, do not drive .  Magnesium 400 MG TABS, Take 400 mg by mouth daily.  .  metoCLOPramide (REGLAN) 5 MG  tablet, Take 1 tablet (5 mg total) by mouth 3 (three) times daily. .  Multiple Vitamin (MULTIVITAMIN PO), Take 1 capsule by mouth daily. Nature Bounty 100% fruit & vegetable. .  ondansetron (ZOFRAN) 8 MG tablet, 1/2-1 tablet as needed for nausea and vomiting every 6 hours. .  polyethylene glycol (MIRALAX / GLYCOLAX) 17 g packet, Take 17 g by mouth daily as needed for mild constipation.  .  Probiotic Product (ALIGN) 4 MG CAPS, Take 1 capsule by mouth at bedtime.  .  thiamine (VITAMIN B-1) 100 MG tablet, Take 100 mg by mouth every other day.  .  Turmeric (QC TUMERIC COMPLEX PO), Take 1 capsule by mouth daily.  Marland Kitchen  ZINC-VITAMIN C PO, Take 1 tablet by mouth daily.  .  pantoprazole (PROTONIX) 40 MG tablet, Take 1 tablet (40 mg total) by mouth 2 (two) times daily before a meal.  Allergies:  Allergies  Allergen Reactions  . Morphine And Related Swelling   Medical History:  She has Lung nodule; Factor V Leiden ([place]); Mixed hyperlipidemia; Elevated homocysteine; Vitamin D deficiency; Endometriosis; Abnormal liver CT; Hiatal hernia; CKD (chronic kidney disease); NASH (nonalcoholic steatohepatitis); and Low back pain on their problem list.   Surgical History:  She has a past surgical history that includes Cholecystectomy; Abdominal hysterectomy (1998); Laparoscopic endometriosis fulguration; Hemorrhoid surgery (1996); Back surgery (2000); and Esophagogastroduodenoscopy (2014). Family History:  Herfamily history includes Arthritis in her mother; Atrial fibrillation in her brother; Bowel Disease in her mother; Cancer (age of onset: 79) in her maternal grandmother; Cirrhosis in her mother; Colon cancer in her maternal grandmother; Depression in her mother and sister; Diabetes in her brother, father, and mother; Gout in her mother; Heart disease in her father; Hyperlipidemia in her son; Hypertension in her brother; Valvular heart disease in her brother and sister. Social History:  She reports that she has  never smoked. She has never used smokeless tobacco. She reports current alcohol use. She reports that she does not use drugs.  Review of Systems: Review of Systems  Constitutional: Positive for malaise/fatigue. Negative for chills, diaphoresis, fever and weight loss.  HENT: Negative.   Eyes: Negative.   Respiratory: Negative.   Cardiovascular: Positive for leg swelling. Negative for chest pain, palpitations, orthopnea, claudication and PND.  Gastrointestinal: Positive for heartburn. Negative for abdominal pain, blood in stool, constipation, diarrhea, melena, nausea and vomiting.  Genitourinary: Negative.   Musculoskeletal: Positive for myalgias. Negative for back pain, falls, joint pain and neck pain.  Skin: Negative for itching and rash.  Neurological: Negative.  Negative for weakness.  Endo/Heme/Allergies: Negative.   Psychiatric/Behavioral: Negative for hallucinations, memory loss, substance abuse and suicidal ideas. The patient has insomnia. The patient is not nervous/anxious.     Physical  Exam: Estimated body mass index is 28.44 kg/m as calculated from the following:   Height as of 08/29/19: 5' 6"  (1.676 m).   Weight as of this encounter: 176 lb 3.2 oz (79.9 kg). BP 120/86   Pulse 79   Temp 97.7 F (36.5 C)   Wt 176 lb 3.2 oz (79.9 kg)   SpO2 98%   BMI 28.44 kg/m  General Appearance: Well nourished, in no apparent distress.  Eyes: PERRLA, EOMs, conjunctiva no swelling or erythema, normal fundi and vessels.  Sinuses: No Frontal/maxillary tenderness  ENT/Mouth: Ext aud canals clear, normal light reflex with TMs without erythema, bulging. Good dentition. No erythema, swelling, or exudate on post pharynx. Tonsils not swollen or erythematous. Hearing normal.  Neck: Supple, thyroid normal. No bruits  Respiratory: Respiratory effort normal, BS equal bilaterally without rales, rhonchi, wheezing or stridor.  Cardio: RRR without murmurs, rubs or gallops. Brisk peripheral pulses without  edema.  Chest: symmetric, with normal excursions and percussion.  Abdomen: Soft, epigastric tenderness, no LLQ tenderness, neg murphy, no guarding, rebound, hernias, masses, or organomegaly.  Lymphatics: Non tender without lymphadenopathy.  Musculoskeletal: Full ROM all peripheral extremities,5/5 strength, and normal gait. Right medial joint line tenderness of knee, no effusion.  Skin: + varicose veins bilateral legs,no erythema, swelling, warmth. Warm, dry without rashes, lesions, ecchymosis. Neuro: Cranial nerves intact, reflexes equal bilaterally. Normal muscle tone, no cerebellar symptoms. Sensation intact.  Psych: Awake and oriented X 3, normal affect, Insight and Judgment appropriate.   Vicie Mutters 1:29 PM [facility]

## 2019-10-16 ENCOUNTER — Encounter: Payer: Self-pay | Admitting: Physician Assistant

## 2019-10-16 ENCOUNTER — Telehealth: Payer: Self-pay | Admitting: Nurse Practitioner

## 2019-10-16 ENCOUNTER — Ambulatory Visit: Payer: 59 | Admitting: Physician Assistant

## 2019-10-16 ENCOUNTER — Other Ambulatory Visit: Payer: Self-pay

## 2019-10-16 VITALS — BP 120/86 | HR 79 | Temp 97.7°F | Wt 176.2 lb

## 2019-10-16 DIAGNOSIS — R233 Spontaneous ecchymoses: Secondary | ICD-10-CM | POA: Diagnosis not present

## 2019-10-16 DIAGNOSIS — Z1152 Encounter for screening for COVID-19: Secondary | ICD-10-CM

## 2019-10-16 DIAGNOSIS — R1013 Epigastric pain: Secondary | ICD-10-CM | POA: Diagnosis not present

## 2019-10-16 DIAGNOSIS — R609 Edema, unspecified: Secondary | ICD-10-CM | POA: Diagnosis not present

## 2019-10-16 MED ORDER — PANTOPRAZOLE SODIUM 40 MG PO TBEC: 40.0000 mg | DELAYED_RELEASE_TABLET | Freq: Two times a day (BID) | ORAL | 1 refills | Status: DC

## 2019-10-16 NOTE — Telephone Encounter (Signed)
Thanks [employer]

## 2019-10-16 NOTE — Patient Instructions (Addendum)
Add protonix do 2 x a day for 7-14 days then once a day Do the reglan as needed   Food Choices for Gastroesophageal Reflux Disease, Adult When you have gastroesophageal reflux disease (GERD), the foods you eat and your eating habits are very important. Choosing the right foods can help ease the discomfort of GERD. Consider working with a diet and nutrition specialist (dietitian) to help you make healthy food choices. What general guidelines should I follow?  Eating plan  Choose healthy foods low in fat, such as fruits, vegetables, whole grains, low-fat dairy products, and lean meat, fish, and poultry.  Eat frequent, small meals instead of three large meals each day. Eat your meals slowly, in a relaxed setting. Avoid bending over or lying down until 2-3 hours after eating.  Limit high-fat foods such as fatty meats or fried foods.  Limit your intake of oils, butter, and shortening to less than 8 teaspoons each day.  Avoid the following: ? Foods that cause symptoms. These may be different for different people. Keep a food diary to keep track of foods that cause symptoms. ? Alcohol. ? Drinking large amounts of liquid with meals. ? Eating meals during the 2-3 hours before bed.  Cook foods using methods other than frying. This may include baking, grilling, or broiling. Lifestyle  Maintain a healthy weight. Ask your health care provider what weight is healthy for you. If you need to lose weight, work with your health care provider to do so safely.  Exercise for at least 30 minutes on 5 or more days each week, or as told by your health care provider.  Avoid wearing clothes that fit tightly around your waist and chest.  Do not use any products that contain nicotine or tobacco, such as cigarettes and e-cigarettes. If you need help quitting, ask your health care provider.  Sleep with the head of your bed raised. Use a wedge under the mattress or blocks under the bed frame to raise the head of  the bed. What foods are not recommended? The items listed may not be a complete list. Talk with your dietitian about what dietary choices are best for you. Grains Pastries or quick breads with added fat. [place] toast. Vegetables Deep fried vegetables. [place] fries. Any vegetables prepared with added fat. Any vegetables that cause symptoms. For some people this may include tomatoes and tomato products, chili peppers, onions and garlic, and horseradish. Fruits Any fruits prepared with added fat. Any fruits that cause symptoms. For some people this may include citrus fruits, such as oranges, grapefruit, pineapple, and lemons. Meats and other protein foods High-fat meats, such as fatty beef or pork, hot dogs, ribs, ham, sausage, salami and bacon. Fried meat or protein, including fried fish and fried chicken. Nuts and nut butters. Dairy Whole milk and chocolate milk. Sour cream. Cream. Ice cream. Cream cheese. Milk shakes. Beverages Coffee and tea, with or without caffeine. Carbonated beverages. Sodas. Energy drinks. Fruit juice made with acidic fruits (such as orange or grapefruit). Tomato juice. Alcoholic drinks. Fats and oils Butter. Margarine. Shortening. Ghee. Sweets and desserts Chocolate and cocoa. Donuts. Seasoning and other foods Pepper. Peppermint and spearmint. Any condiments, herbs, or seasonings that cause symptoms. For some people, this may include curry, hot sauce, or vinegar-based salad dressings. Summary  When you have gastroesophageal reflux disease (GERD), food and lifestyle choices are very important to help ease the discomfort of GERD.  Eat frequent, small meals instead of three large meals each day. Eat  your meals slowly, in a relaxed setting. Avoid bending over or lying down until 2-3 hours after eating.  Limit high-fat foods such as fatty meat or fried foods. This information is not intended to replace advice given to you by your health care provider. Make sure you  discuss any questions you have with your health care provider. Document Revised: 07/26/2018 Document Reviewed: 04/05/2016 Elsevier Patient Education  [place].

## 2019-10-16 NOTE — Telephone Encounter (Signed)
Spoke with the patient.  She was seen by her PCP today. States she was encouraged to follow up with GI. States she was told she may need an EGD. States bloating belching and abdominal discomfort.  Her PCP changed her medications.  Agrees to the next available appointment. She will call us if she acutely worsens. Denies any vomiting, nausea or blood in her stools.

## 2019-10-17 LAB — COMPLETE METABOLIC PANEL WITH GFR
AG Ratio: 2 (calc) (ref 1.0–2.5)
ALT: 29 U/L (ref 6–29)
AST: 24 U/L (ref 10–35)
Albumin: 4.7 g/dL (ref 3.6–5.1)
Alkaline phosphatase (APISO): 74 U/L (ref 37–153)
BUN: 17 mg/dL (ref 7–25)
CO2: 24 mmol/L (ref 20–32)
Calcium: 9.8 mg/dL (ref 8.6–10.4)
Chloride: 108 mmol/L (ref 98–110)
Creat: 0.73 mg/dL (ref 0.50–0.99)
GFR, Est African American: 102 mL/min/{1.73_m2} (ref >=60)
GFR, Est Non African American: 88 mL/min/{1.73_m2} (ref >=60)
Globulin: 2.3 g/dL (calc) (ref 1.9–3.7)
Glucose, Bld: 97 mg/dL (ref 65–99)
Potassium: 4.3 mmol/L (ref 3.5–5.3)
Sodium: 141 mmol/L (ref 135–146)
Total Bilirubin: 0.6 mg/dL (ref 0.2–1.2)
Total Protein: 7 g/dL (ref 6.1–8.1)

## 2019-10-17 LAB — CBC WITH DIFFERENTIAL/PLATELET
Absolute Monocytes: 367 cells/uL (ref 200–950)
Basophils Absolute: 59 cells/uL (ref 0–200)
Basophils Relative: 1.5 %
Eosinophils Absolute: 90 cells/uL (ref 15–500)
Eosinophils Relative: 2.3 %
HCT: 42 % (ref 35.0–45.0)
Hemoglobin: 14.1 g/dL (ref 11.7–15.5)
Lymphs Abs: 1435 cells/uL (ref 850–3900)
MCH: 31 pg (ref 27.0–33.0)
MCHC: 33.6 g/dL (ref 32.0–36.0)
MCV: 92.3 fL (ref 80.0–100.0)
MPV: 11.3 fL (ref 7.5–12.5)
Monocytes Relative: 9.4 %
Neutro Abs: 1950 cells/uL (ref 1500–7800)
Neutrophils Relative %: 50 %
Platelets: 260 10*3/uL (ref 140–400)
RBC: 4.55 10*6/uL (ref 3.80–5.10)
RDW: 12.4 % (ref 11.0–15.0)
Total Lymphocyte: 36.8 %
WBC: 3.9 10*3/uL (ref 3.8–10.8)

## 2019-10-17 LAB — AMYLASE: Amylase: 43 U/L (ref 21–101)

## 2019-10-17 LAB — IRON, TOTAL/TOTAL IRON BINDING CAP
%SAT: 24 % (calc) (ref 16–45)
Iron: 87 ug/dL (ref 45–160)
TIBC: 360 mcg/dL (calc) (ref 250–450)

## 2019-10-17 LAB — PROTIME-INR
INR: 0.9
Prothrombin Time: 9.8 s (ref 9.0–11.5)

## 2019-10-17 LAB — LIPASE: Lipase: 10 U/L (ref 7–60)

## 2019-10-17 LAB — SARS COV-2 SEROLOGY(COVID-19)AB(IGG,IGM),IMMUNOASSAY
SARS CoV-2 AB IgG: NEGATIVE
SARS CoV-2 IgM: NEGATIVE

## 2019-10-17 LAB — MAGNESIUM: Magnesium: 2.4 mg/dL (ref 1.5–2.5)

## 2019-10-17 LAB — FERRITIN: Ferritin: 100 ng/mL (ref 16–288)

## 2019-11-11 ENCOUNTER — Ambulatory Visit: Payer: 59 | Admitting: Physician Assistant

## 2019-11-15 ENCOUNTER — Encounter: Payer: Self-pay | Admitting: Nurse Practitioner

## 2019-11-15 ENCOUNTER — Ambulatory Visit: Payer: 59 | Admitting: Nurse Practitioner

## 2019-11-15 ENCOUNTER — Ambulatory Visit: Payer: 59 | Admitting: Physician Assistant

## 2019-11-15 VITALS — BP 110/76 | HR 96 | Ht 66.0 in | Wt 174.4 lb

## 2019-11-15 DIAGNOSIS — R1013 Epigastric pain: Secondary | ICD-10-CM | POA: Diagnosis not present

## 2019-11-15 DIAGNOSIS — R140 Abdominal distension (gaseous): Secondary | ICD-10-CM | POA: Diagnosis not present

## 2019-11-15 DIAGNOSIS — R110 Nausea: Secondary | ICD-10-CM | POA: Diagnosis not present

## 2019-11-15 DIAGNOSIS — R142 Eructation: Secondary | ICD-10-CM

## 2019-11-15 NOTE — Progress Notes (Signed)
IMPRESSION and PLAN:    64 year old female with PMH significant for hyperlipidemia , GERD, colon polyps, diverticulitis, endometriosis , factor V Leiden, [place], hysterectomy, cholecystectomy, chronic low back pain    # Bloating / intermittent nausea / belching /  periodic epigastric pain.  --CBC, LFTs, lipase normal.  --Etiology of bloating unclear but after PCP stopped NSAIDS and vitamin supplements and started PPI the bloaitng / distention significantly improved. Antibiotics in April could have caused disruption of normal gut flora. May benefit from trial of probiotics if recurrs. .Celiac studies negative.  --Still having periodic epigastric pain / nausea despite PPI and discontinuation of NSAIDS. PUD on list of DDx. Normal pancreas on CT scan for suspected diverticulitis in April. For further evaluation will arrange for EGD. The risks and benefits of EGD were discussed and the patient agrees to proceed.  --In the interim continue daily PPI --Avoid NSAIDS. Unfortunately she has a slipped disc, Ortho prescribed a course of prednisone and Mobic in June but she has been scared to take it due to gastrointestinal issues. If having significant back / leg  pain would recommend only taking the prednisone for now. Further consider of NSAID use can be given after EGD    # Hx of adenomatous colon polyps --2 small polyps < 10 mm on last colonoscopy in 2019 --Originally a 5 year recall colonoscopy was recommended but guidelines have changed. Dr. Havery Moros may elect to change to 7 year recall but wil defer to him.     HPI:    Primary GI:  Cellar, MD   Chief complaint : bloating and belching ( both better), upper abdominal pain and some nausea  This patient is a 63 yo female who I saw in the office in May after a bout of diverticulitis. She was better after completing antibiotics but at some point later developed significant bloating / distention / belching and upper abdominal pain.    Patient called the office a year ago with complaints of gas. We gave her diet information. She says her current symptoms are different.  Abdomen is at its flattest in am, gets progressively distended as day progresses. She has excessive belching and some nausea. Additionally she has  epigastric pain. Pain sometimes can radiate to the back. For the symptoms she couldn't get a appt here so went to see PCP on 6/30. She had been taking ibuprofen and Aleve for back pain for months. PCP stopped NSAIDS. Also stopped all of her supplements. She was started on Pantoprazole BID x 2 weeks then decreased to once daily. Also prescribed Reglan but patient hasn't taken it. She thought it was to make bowels moves but takes Miralax for that. Furthermore she was concerned about potential for EP side effects. Labs obtained and TTg,  CBC,  lipase were normal. LFT normal except for minimally elevated ALT.    Samantha Mckinney is feeling better. No having near as much abdominal bloaitng / distention but still having some epigastric discomfort and nausea. Pain not necessarily related to eating, even wire bra can aggravate the pain. Still has some intermittent nausea, mainly postprandial.  Her weight is overall stable.   Review of systems:     No chest pain, no SOB, no fevers, no urinary sx   Past Medical History:  Diagnosis Date  . Allergy   . Cataract   . Colon polyps   . Elevated cholesterol   . Endometriosis 02/02/2017  . Factor V Leiden ([place]) 02/02/2017  . GERD (gastroesophageal  reflux disease)   . IBS (irritable bowel syndrome)   . Liver hemangioma 02/02/2017  . NASH (nonalcoholic steatohepatitis) 02/02/2017    Patient's surgical history, family medical history, social history, medications and allergies were all reviewed in Epic   Creatinine clearance cannot be calculated (Patient's most recent lab result is older than the maximum 21 days allowed.)  Current Outpatient Medications  Medication Sig Dispense Refill  .  BIOTIN PO Take 5,000 mcg by mouth daily.     . cyclobenzaprine (FLEXERIL) 10 MG tablet Take 1 tablet (10 mg total) by mouth at bedtime as needed for muscle spasms. 90 tablet 0  . pantoprazole (PROTONIX) 40 MG tablet Take 1 tablet (40 mg total) by mouth 2 (two) times daily before a meal. 60 tablet 1  . polyethylene glycol (MIRALAX / GLYCOLAX) 17 g packet Take 17 g by mouth daily as needed for mild constipation.     . thiamine (VITAMIN B-1) 100 MG tablet Take 100 mg by mouth every other day.      No current facility-administered medications for this visit.    Filed Weights   11/15/19 1324  Weight: 174 lb 6 oz (79.1 kg)    Physical Exam:     BP 110/76   Pulse 96   Ht 5' 6"  (1.676 m)   Wt 174 lb 6 oz (79.1 kg)   BMI 28.14 kg/m   GENERAL:  Pleasant female in NAD PSYCH: : Cooperative, normal affect CARDIAC:  RRR PULM: Normal respiratory effort, lungs CTA bilaterally, no wheezing ABDOMEN:  Nondistended, soft, nontender. No obvious masses, no hepatomegaly,  normal bowel sounds SKIN:  turgor, no lesions seen Musculoskeletal:  Normal muscle tone, normal strength NEURO: Alert and oriented x 3, no focal neurologic deficits   Tye Savoy , NP 11/15/2019, 1:38 PM

## 2019-11-15 NOTE — Progress Notes (Signed)
Agree with assessment and plan as outlined.  

## 2019-11-15 NOTE — Patient Instructions (Signed)
If you are age 63 or older, your body mass index should be between 23-30. Your Body mass index is 28.14 kg/m. If this is out of the aforementioned range listed, please consider follow up with your Primary Care Provider.  If you are age 43 or younger, your body mass index should be between 19-25. Your Body mass index is 28.14 kg/m. If this is out of the aformentioned range listed, please consider follow up with your Primary Care Provider.   Continue taking Protonix daily.  Due to recent changes in healthcare laws, you may see the results of your imaging and laboratory studies on MyChart before your provider has had a chance to review them.  We understand that in some cases there may be results that are confusing or concerning to you. Not all laboratory results come back in the same time frame and the provider may be waiting for multiple results in order to interpret others.  Please give [place] 48 hours in order for your provider to thoroughly review all the results before contacting the office for clarification of your results.   Thank you for choosing me and [facility]  Tye Savoy, NP

## 2019-11-19 ENCOUNTER — Encounter: Payer: Self-pay | Admitting: Gastroenterology

## 2019-11-20 ENCOUNTER — Ambulatory Visit (INDEPENDENT_AMBULATORY_CARE_PROVIDER_SITE_OTHER): Payer: 59

## 2019-11-20 ENCOUNTER — Other Ambulatory Visit: Payer: Self-pay | Admitting: Gastroenterology

## 2019-11-20 DIAGNOSIS — Z1159 Encounter for screening for other viral diseases: Secondary | ICD-10-CM

## 2019-11-20 LAB — SARS CORONAVIRUS 2 (TAT 6-24 HRS): SARS Coronavirus 2: NEGATIVE

## 2019-11-22 ENCOUNTER — Encounter: Payer: Self-pay | Admitting: Gastroenterology

## 2019-11-22 ENCOUNTER — Other Ambulatory Visit: Payer: Self-pay

## 2019-11-22 ENCOUNTER — Ambulatory Visit: Payer: 59 | Admitting: Physician Assistant

## 2019-11-22 ENCOUNTER — Ambulatory Visit (AMBULATORY_SURGERY_CENTER): Payer: 59 | Admitting: Gastroenterology

## 2019-11-22 VITALS — BP 110/62 | HR 69 | Temp 97.5°F | Resp 12 | Ht 66.0 in | Wt 174.0 lb

## 2019-11-22 DIAGNOSIS — R140 Abdominal distension (gaseous): Secondary | ICD-10-CM | POA: Diagnosis not present

## 2019-11-22 DIAGNOSIS — K317 Polyp of stomach and duodenum: Secondary | ICD-10-CM | POA: Diagnosis not present

## 2019-11-22 DIAGNOSIS — R1013 Epigastric pain: Secondary | ICD-10-CM

## 2019-11-22 MED ORDER — DICYCLOMINE HCL 10 MG PO CAPS: 10.0000 mg | ORAL_CAPSULE | Freq: Three times a day (TID) | ORAL | 3 refills | Status: DC

## 2019-11-22 MED ORDER — SODIUM CHLORIDE 0.9 % IV SOLN: 500.0000 mL | Freq: Once | INTRAVENOUS | Status: DC

## 2019-11-22 NOTE — Progress Notes (Signed)
PT taken to PACU. Monitors in place. VSS. Report given to RN. 

## 2019-11-22 NOTE — Op Note (Signed)
[facility] Patient Name: Samantha Mckinney Procedure Date: 11/22/2019 7:38 AM MRN: [id number] Endoscopist: Remo Lipps P. Havery Moros , MD Age: 64 Referring MD:  Date of Birth: 1955/05/25 Gender: Female Account #: [id number] Procedure:                Upper GI endoscopy Indications:              Upper abdominal pain, Abdominal bloating /                            distension, history of NSAID use and reported dark                            stools, also with history of diverticulitis in                            recent months, mild improvement with trial of                            protonix. Colonoscopy in 2019 showed diverticulosis                            in left colon correlating to CT scan findings.                            Follow up MRI abdomen without obvious pathology Medicines:                Monitored Anesthesia Care Procedure:                Pre-Anesthesia Assessment:                           - Prior to the procedure, a History and Physical                            was performed, and patient medications and                            allergies were reviewed. The patient's tolerance of                            previous anesthesia was also reviewed. The risks                            and benefits of the procedure and the sedation                            options and risks were discussed with the patient.                            All questions were answered, and informed consent                            was obtained. Prior Anticoagulants: The patient has  taken no previous anticoagulant or antiplatelet                            agents. ASA Grade Assessment: II - A patient with                            mild systemic disease. After reviewing the risks                            and benefits, the patient was deemed in                            satisfactory condition to undergo the procedure.                           After obtaining  informed consent, the endoscope was                            passed under direct vision. Throughout the                            procedure, the patient's blood pressure, pulse, and                            oxygen saturations were monitored continuously. The                            Endoscope was introduced through the mouth, and                            advanced to the second part of duodenum. The upper                            GI endoscopy was accomplished without difficulty.                            The patient tolerated the procedure well. Scope In: Scope Out: Findings:                 Esophagogastric landmarks were identified: the                            Z-line was found at 36 cm, the gastroesophageal                            junction was found at 36 cm and the upper extent of                            the gastric folds was found at 36 cm from the                            incisors.  The exam of the esophagus was otherwise normal.                           A few small benign appearing sessile polyps were                            found in the gastric fundus and in the gastric                            body. Two representative polyps were removed with a                            cold biopsy forceps. Resection and retrieval were                            complete.                           The exam of the stomach was otherwise normal.                           Biopsies were taken with a cold forceps in the                            gastric body, at the incisura and in the gastric                            antrum for Helicobacter pylori testing.                           The duodenal bulb and second portion of the                            duodenum were normal. Biopsies for histology were                            taken with a cold forceps for evaluation of celiac                            disease. Complications:            No  immediate complications. Estimated blood loss:                            Minimal. Estimated Blood Loss:     Estimated blood loss was minimal. Impression:               - Esophagogastric landmarks identified.                           - Normal esophagus otherwise                           - A few benign appearing gastric polyps. Two  representative samples resected and retrieved.                           - Normal stomach otherwise - biopsies taken to rule                            out H pylori                           - Normal duodenal bulb and second portion of the                            duodenum. Biopsied.                           No overt cause for symptoms on EGD, will await                            pathology results. Unclear if patient has had a                            functional change post diverticulitis? Would give                            trial of bentyl and may consider empiric course of                            rifaximin given significant bloating / distension                            symptoms. Recommendation:           - Patient has a contact number available for                            emergencies. The signs and symptoms of potential                            delayed complications were discussed with the                            patient. Return to normal activities tomorrow.                            Written discharge instructions were provided to the                            patient.                           - Resume previous diet.                           - Continue present medications.                           -  Trial of bentyl 47m every 8 hours PRN                           - Await pathology results.                           - Consideration for trial of empiric Rifaximin as                            outlined SRemo LippsP. AHavery Moros MD 11/22/2019 8:20:08 AM This report has been signed electronically.

## 2019-11-22 NOTE — Progress Notes (Signed)
Called to room to assist during endoscopic procedure.  Patient ID and intended procedure confirmed with present staff. Received instructions for my participation in the procedure from the performing physician.  

## 2019-11-22 NOTE — Progress Notes (Signed)
VS taken by C.W. 

## 2019-11-22 NOTE — Patient Instructions (Signed)
YOU HAD AN ENDOSCOPIC PROCEDURE TODAY AT THE [facility]:   Refer to the procedure report that was given to you for any specific questions about what was found during the examination.  If the procedure report does not answer your questions, please call your gastroenterologist to clarify.  If you requested that your care partner not be given the details of your procedure findings, then the procedure report has been included in a sealed envelope for you to review at your convenience later.  YOU SHOULD EXPECT: Some feelings of bloating in the abdomen. Passage of more gas than usual.  Walking can help get rid of the air that was put into your GI tract during the procedure and reduce the bloating. If you had a lower endoscopy (such as a colonoscopy or flexible sigmoidoscopy) you may notice spotting of blood in your stool or on the toilet paper. If you underwent a bowel prep for your procedure, you may not have a normal bowel movement for a few days.  Please Note:  You might notice some irritation and congestion in your nose or some drainage.  This is from the oxygen used during your procedure.  There is no need for concern and it should clear up in a day or so.  SYMPTOMS TO REPORT IMMEDIATELY:    Following upper endoscopy (EGD)  Vomiting of blood or coffee ground material  New chest pain or pain under the shoulder blades  Painful or persistently difficult swallowing  New shortness of breath  Fever of 100F or higher  Black, tarry-looking stools  For urgent or emergent issues, a gastroenterologist can be reached at any hour by calling [phone]. Do not use MyChart messaging for urgent concerns.    DIET:  We do recommend a small meal at first, but then you may proceed to your regular diet.  Drink plenty of fluids but you should avoid alcoholic beverages for 24 hours.  ACTIVITY:  You should plan to take it easy for the rest of today and you should NOT DRIVE or use heavy machinery  until tomorrow (because of the sedation medicines used during the test).    FOLLOW UP: Our staff will call the number listed on your records 48-72 hours following your procedure to check on you and address any questions or concerns that you may have regarding the information given to you following your procedure. If we do not reach you, we will leave a message.  We will attempt to reach you two times.  During this call, we will ask if you have developed any symptoms of COVID 19. If you develop any symptoms (ie: fever, flu-like symptoms, shortness of breath, cough etc.) before then, please call [phone].  If you test positive for Covid 19 in the 2 weeks post procedure, please call and report this information to us.    If any biopsies were taken you will be contacted by phone or by letter within the next 1-3 weeks.  Please call us at [phone] if you have not heard about the biopsies in 3 weeks.    SIGNATURES/CONFIDENTIALITY: You and/or your care partner have signed paperwork which will be entered into your electronic medical record.  These signatures attest to the fact that that the information above on your After Visit Summary has been reviewed and is understood.  Full responsibility of the confidentiality of this discharge information lies with you and/or your care-partner. 

## 2019-11-26 ENCOUNTER — Telehealth: Payer: Self-pay | Admitting: *Deleted

## 2019-11-26 NOTE — Telephone Encounter (Signed)
  Follow up Call-  Call back number 11/22/2019 12/25/2017  Post procedure Call Back phone  # [phone]  Permission to leave phone message Yes Yes  Some recent data might be hidden     Patient questions:  Do you have a fever, pain , or abdominal swelling? No. Pain Score  0 *  Have you tolerated food without any problems? Yes.    Have you been able to return to your normal activities? Yes.    Do you have any questions about your discharge instructions: Diet   No. Medications  No. Follow up visit  No.  Do you have questions or concerns about your Care? No.  Actions: * If pain score is 4 or above: No action needed, pain <4.  1. Have you developed a fever since your procedure? no  2.   Have you had an respiratory symptoms (SOB or cough) since your procedure? no  3.   Have you tested positive for COVID 19 since your procedure no  4.   Have you had any family members/close contacts diagnosed with the COVID 19 since your procedure?  no   If yes to any of these questions please route to Joylene John, RN and Joella Prince, RN

## 2020-01-02 ENCOUNTER — Ambulatory Visit: Payer: 59 | Admitting: Adult Health Nurse Practitioner

## 2020-01-02 ENCOUNTER — Other Ambulatory Visit: Payer: Self-pay

## 2020-01-02 ENCOUNTER — Encounter: Payer: Self-pay | Admitting: Adult Health Nurse Practitioner

## 2020-01-02 VITALS — BP 120/84 | HR 80 | Temp 97.0°F | Wt 178.6 lb

## 2020-01-02 DIAGNOSIS — R000 Tachycardia, unspecified: Secondary | ICD-10-CM

## 2020-01-02 DIAGNOSIS — M545 Low back pain, unspecified: Secondary | ICD-10-CM

## 2020-01-02 DIAGNOSIS — D6851 Activated protein C resistance: Secondary | ICD-10-CM | POA: Diagnosis not present

## 2020-01-02 DIAGNOSIS — R140 Abdominal distension (gaseous): Secondary | ICD-10-CM | POA: Diagnosis not present

## 2020-01-02 NOTE — Progress Notes (Signed)
Assessment and Plan:  Samantha Mckinney was seen today for acute visit.  Diagnoses and all orders for this visit:  Tachycardia Continue to monitor symptoms and blood pressure -     CBC with Differential/Platelet -     COMPLETE METABOLIC PANEL WITH GFR -     TSH Discussed adding medication if symptoms were persistent  Abdominal bloating Discussed dietary modifications Take dicyclomine 94m before every meal.  Discussed used of medication prn vs routinely based on symptoms No NSAID or ETOH use  Low back pain, unspecified back pain laterality, unspecified chronicity, unspecified whether sciatica present Continue follow with Ortho  Factor V Leiden ([facility] Discussed hospital precautions chest pains, shortness of breath, sustained tachycardia with symptoms.  [facility] office with any new or worsening symptoms   Further disposition pending results of labs. Discussed med's effects and SE's.   Over 30 minutes of face to face interview, exam, counseling, chart review, and critical decision making was performed.   Future Appointments  Date Time [place] 01/07/2020  1:20 PM HLeonie Man MD [facility] 08/10/2020  3:00 PM CLiane Comber NP [facility] None    ------------------------------------------------------------------------------------------------------------------   HPI 64y.o.female presents for evaluation of tachycardia, new onset.  She has been follows with [facility] for lumbar pain and had trigger point injection on 11/20/19 DepoMedrol / lidocaine or lumbar radiculopathy and degenerative lumbar disc.  Without resolve she was referred to Dr RNelva Bushfor injection on 12/19/19.  She reports resting heart rate of 110 upon waking this am.  She noticed this as her smart watch has been notifying her of elevated heart rates.  Her pulse is 80 in office today.  She reports the tachycardia is intermittent in nature.  She is not able to tell when it was happening and related  some of her symptoms to her back pain.  She does report and instance where she had some chest tightness that radiated to her back, short in duration and resolved.  She is asymptomatic in office today.  Instances of this have been increasing.  Reviewing data from her smart phone shows daily average ranges from  80's-150's.  She denies any new medications, OTC or supplements.  She had an endoscopy on 11/22/19 related to symptoms of abdominal bloating /distention, history of NSAID use w/ dark stools, diverticulitis with improvement on pantoprazole, last colonoscopy 12/2017.  She was prescribed dicyclomine for the abdominal distention / bloating. She is currently not tking this medication.  She does report some intermittent crapping and bloating.   Medical History:  She has Lung nodule; Factor V [place]; Mixed hyperlipidemia; Elevated homocysteine; Vitamin D deficiency; Endometriosis; Abnormal liver CT; Hiatal hernia; CKD (chronic kidney disease); NASH (nonalcoholic steatohepatitis); and Low back pain on their problem list.   Surgical History:  She has a past surgical history that includes Cholecystectomy; Abdominal hysterectomy (1998); Laparoscopic endometriosis fulguration; Hemorrhoid surgery (1996); Back surgery (2000); and Esophagogastroduodenoscopy (2014).  Family History:  Her family history includes Arthritis in her mother; Atrial fibrillation in her brother; Bowel Disease in her mother; Cancer (age of onset: 653 in her maternal grandmother; Cirrhosis in her mother; Colon cancer in her maternal grandmother; Depression in her mother and sister; Diabetes in her brother, father, and mother; Gout in her mother; Heart disease in her father; Hyperlipidemia in her son; Hypertension in her brother; Valvular heart disease in her brother and sister.   Past Medical History:  Diagnosis Date   Allergy    Cataract  Colon polyps    Elevated cholesterol    Endometriosis 02/02/2017   Factor V  Leiden ([place]) 02/02/2017   GERD (gastroesophageal reflux disease)    IBS (irritable bowel syndrome)    Liver hemangioma 02/02/2017   NASH (nonalcoholic steatohepatitis) 02/02/2017     Allergies  Allergen Reactions   Morphine And Related Swelling    Current Outpatient Medications on File Prior to Visit  Medication Sig   BIOTIN PO Take 5,000 mcg by mouth daily.    cyclobenzaprine (FLEXERIL) 10 MG tablet Take 1 tablet (10 mg total) by mouth at bedtime as needed for muscle spasms.   dicyclomine (BENTYL) 10 MG capsule Take 1 capsule (10 mg total) by mouth 3 (three) times daily before meals. Trial of Bentyl 62m every 8 hours as needed.   pantoprazole (PROTONIX) 40 MG tablet Take 1 tablet (40 mg total) by mouth 2 (two) times daily before a meal.   polyethylene glycol (MIRALAX / GLYCOLAX) 17 g packet Take 17 g by mouth daily as needed for mild constipation.    thiamine (VITAMIN B-1) 100 MG tablet Take 100 mg by mouth every other day.    No current facility-administered medications on file prior to visit.    ROS: all negative except above.   Physical Exam:  BP 120/84    Pulse 80    Temp (!) 97 F (36.1 C)    Wt 178 lb 9.6 oz (81 kg)    SpO2 99%    BMI 28.83 kg/m   General Appearance: Well nourished, in no apparent distress. Eyes: PERRLA, EOMs, conjunctiva no swelling or erythema Sinuses: No Frontal/maxillary tenderness ENT/Mouth: Ext aud canals clear, TMs without erythema, bulging. No erythema, swelling, or exudate on post pharynx.  Tonsils not swollen or erythematous. Hearing normal.  Neck: Supple, thyroid normal.  Respiratory: Respiratory effort normal, BS equal bilaterally without rales, rhonchi, wheezing or stridor.  Cardio: RRR with no MRGs. Brisk peripheral pulses without edema.  Abdomen: Soft, + BS.  Non tender, no guarding, rebound, hernias, masses. Lymphatics: Non tender without lymphadenopathy.  Musculoskeletal: Full ROM, 5/5 strength, normal gait.  Skin: Warm,  dry without rashes, lesions, ecchymosis.  Neuro: Cranial nerves intact. Normal muscle tone, no cerebellar symptoms. Sensation intact.  Psych: Awake and oriented X 3, normal affect, Insight and Judgment appropriate.   EKG: NSR in office today   KGarnet Sierras ALaqueta Jean DNP [facility] 01/03/2020  1:50 PM

## 2020-01-03 ENCOUNTER — Encounter: Payer: Self-pay | Admitting: Adult Health Nurse Practitioner

## 2020-01-03 ENCOUNTER — Other Ambulatory Visit: Payer: Self-pay | Admitting: Adult Health Nurse Practitioner

## 2020-01-03 DIAGNOSIS — R000 Tachycardia, unspecified: Secondary | ICD-10-CM

## 2020-01-03 LAB — COMPLETE METABOLIC PANEL WITH GFR
AG Ratio: 2.1 (calc) (ref 1.0–2.5)
ALT: 18 U/L (ref 6–29)
AST: 16 U/L (ref 10–35)
Albumin: 4.6 g/dL (ref 3.6–5.1)
Alkaline phosphatase (APISO): 58 U/L (ref 37–153)
BUN: 18 mg/dL (ref 7–25)
CO2: 28 mmol/L (ref 20–32)
Calcium: 9.8 mg/dL (ref 8.6–10.4)
Chloride: 104 mmol/L (ref 98–110)
Creat: 0.76 mg/dL (ref 0.50–0.99)
GFR, Est African American: 97 mL/min/{1.73_m2} (ref >=60)
GFR, Est Non African American: 84 mL/min/{1.73_m2} (ref >=60)
Globulin: 2.2 g/dL (calc) (ref 1.9–3.7)
Glucose, Bld: 88 mg/dL (ref 65–99)
Potassium: 4.4 mmol/L (ref 3.5–5.3)
Sodium: 141 mmol/L (ref 135–146)
Total Bilirubin: 0.6 mg/dL (ref 0.2–1.2)
Total Protein: 6.8 g/dL (ref 6.1–8.1)

## 2020-01-03 LAB — CBC WITH DIFFERENTIAL/PLATELET
Absolute Monocytes: 402 cells/uL (ref 200–950)
Basophils Absolute: 50 cells/uL (ref 0–200)
Basophils Relative: 0.9 %
Eosinophils Absolute: 83 cells/uL (ref 15–500)
Eosinophils Relative: 1.5 %
HCT: 42.5 % (ref 35.0–45.0)
Hemoglobin: 14.4 g/dL (ref 11.7–15.5)
Lymphs Abs: 1843 cells/uL (ref 850–3900)
MCH: 31.5 pg (ref 27.0–33.0)
MCHC: 33.9 g/dL (ref 32.0–36.0)
MCV: 93 fL (ref 80.0–100.0)
MPV: 10.7 fL (ref 7.5–12.5)
Monocytes Relative: 7.3 %
Neutro Abs: 3124 cells/uL (ref 1500–7800)
Neutrophils Relative %: 56.8 %
Platelets: 255 10*3/uL (ref 140–400)
RBC: 4.57 10*6/uL (ref 3.80–5.10)
RDW: 11.9 % (ref 11.0–15.0)
Total Lymphocyte: 33.5 %
WBC: 5.5 10*3/uL (ref 3.8–10.8)

## 2020-01-03 LAB — TSH: TSH: 1.34 mIU/L (ref 0.40–4.50)

## 2020-01-03 MED ORDER — CARVEDILOL 6.25 MG PO TABS: 6.2500 mg | ORAL_TABLET | Freq: Two times a day (BID) | ORAL | 1 refills | Status: DC

## 2020-01-03 NOTE — Progress Notes (Signed)
Spoke with patient via telephone regarding labs results.  Also discussed sustained tachycardia ranging from 104 to 124 over past 40 minuets.  As of OV one day ago this had not been sustained readings. She reports the highest reading is in the 150's.   She has a smart watch that is capturing the data.  She does check her blood pressure at home and 120's over 80's.  She denies feeling bad, chest, arm or jaw pains. She does report feeling warmer than normal when her heart rate is elevated. She is having back discomfort, that has not changed.  She follows with [facility] and had trigger point injection on 11/20/19 DepoMedrol / lidocaine or lumbar radiculopathy and degenerative lumbar disc.  Without resolve she was referred to Dr Nelva Bush for injection on 12/19/19. Rx for Carvediol 6.27m BID, continue to monitor pulse and blood pressure.  Will do referral for Cardiology Discussed hospital precautions and patient I\agrees with plan of care.  KGarnet Sierras ALaqueta Jean DNP [facility] 01/03/2020  10:43 AM

## 2020-01-07 ENCOUNTER — Encounter: Payer: Self-pay | Admitting: Cardiology

## 2020-01-07 ENCOUNTER — Other Ambulatory Visit: Payer: Self-pay

## 2020-01-07 ENCOUNTER — Ambulatory Visit: Payer: 59 | Admitting: Cardiology

## 2020-01-07 DIAGNOSIS — I479 Paroxysmal tachycardia, unspecified: Secondary | ICD-10-CM | POA: Insufficient documentation

## 2020-01-07 NOTE — Patient Instructions (Signed)
Medication Instructions:  NO CHANGES *If you need a refill on your cardiac medications before your next appointment, please call your pharmacy*   Lab Work: NOT NEEDED If you have labs (blood work) drawn today and your tests are completely normal, you will receive your results only by: Marland Kitchen MyChart Message (if you have MyChart) OR . A paper copy in the mail If you have any lab test that is abnormal or we need to change your treatment, we will call you to review the results.   Testing/Procedures: Your physician has recommended that you wear a 7 DAY ZIO-PATCH monitor. The Zio patch cardiac monitor continuously records heart rhythm data, this is for patients being evaluated for multiple types heart rhythms. For the first 24 hours post application, please avoid getting the Zio monitor wet in the shower or by excessive sweating during exercise. After that, feel free to carry on with regular activities. Keep soaps and lotions away from the ZIO XT Patch.  Someone will be calling you to let you know when this is mailed.  This will be mailed to you, please expect 7-10 days to receive.      Call [place] at [phone] if you have questions regarding your ZIO XT patch monitor.  Call them immediately if you see an orange light blinking on your monitor.   If your monitor falls off in less than 4 days contact our Monitor department at [phone].  If your monitor becomes loose or falls off after 4 days call [employer] at [phone] for suggestions on securing your monitor    Follow-Up: At [facility], you and your health needs are our priority.  As part of our continuing mission to provide you with exceptional heart care, we have created designated Provider Care Teams.  These Care Teams include your primary Cardiologist (physician) and Advanced Practice Providers (APPs -  Physician Assistants and Nurse Practitioners) who all work together to provide you with the care you  need, when you need it.    Your next appointment:   7 week(s)  The format for your next appointment:   In Person  Provider:   Glenetta Hew, MD   Other Instructions IT IS OKAY FOR YOUR TO PROCEED WITH BACK  INJECTION FOR DR Drema Halon- Long Term Monitor Instructions   Your physician has requested you wear your ZIO patch monitor___7____days.   This is a single patch monitor.  [employer] supplies one patch monitor per enrollment.  Additional stickers are not available.   Please do not apply patch if you will be having a Nuclear Stress Test, Echocardiogram, Cardiac CT, MRI, or Chest Xray during the time frame you would be wearing the monitor. The patch cannot be worn during these tests.  You cannot remove and re-apply the ZIO XT patch monitor.   Your ZIO patch monitor will be sent USPS Priority mail from [facility] directly to your home address. The monitor may also be mailed to a PO BOX if home delivery is not available.   It may take 3-5 days to receive your monitor after you have been enrolled.   Once you have received you monitor, please review enclosed instructions.  Your monitor has already been registered assigning a specific monitor serial # to you.   Applying the monitor   Shave hair from upper left chest.   Hold abrader disc by orange tab.  Rub abrader in 40 strokes over left upper chest as indicated in your monitor instructions.  Clean area with 4 enclosed alcohol pads .  Use all pads to assure are is cleaned thoroughly.  Let dry.   Apply patch as indicated in monitor instructions.  Patch will be place under collarbone on left side of chest with arrow pointing upward.   Rub patch adhesive wings for 2 minutes.Remove white label marked "1".  Remove white label marked "2".  Rub patch adhesive wings for 2 additional minutes.   While looking in a mirror, press and release button in center of patch.  A small green light will flash 3-4 times .  This will be your  only indicator the monitor has been turned on.     Do not shower for the first 24 hours.  You may shower after the first 24 hours.   Press button if you feel a symptom. You will hear a small click.  Record Date, Time and Symptom in the Patient Log Book.   When you are ready to remove patch, follow instructions on last 2 pages of Patient Log Book.  Stick patch monitor onto last page of Patient Log Book.   Place Patient Log Book in [place] box.  Use locking tab on box and tape box closed securely.  The [employer] has [employer] on it.  Please place in mailbox as soon as possible.  Your physician should have your test results approximately 7 days after the monitor has been mailed back to [facility].   Call [place] at [phone] if you have questions regarding your ZIO XT patch monitor.  Call them immediately if you see an orange light blinking on your monitor.   If your monitor falls off in less than 4 days contact our Monitor department at [phone].  If your monitor becomes loose or falls off after 4 days call [employer] at [phone] for suggestions on securing your monitor.

## 2020-01-07 NOTE — Progress Notes (Signed)
Primary Care Provider: Unk Pinto, MD Cardiologist: No primary care provider on file. Electrophysiologist: None  Clinic Note: Chief Complaint  Patient presents with  . New Patient (Initial Visit)  . Tachycardia    Intermittent bouts of rapid heart rate as well as slow heart rates on her apple watch monitor   HPI:    Samantha Mckinney is a 64 y.o. female with a PMH notable for factor V Leiden as well as Lower Back Pain/DJD with Lumbar Radiculopathy below who presents today for evaluation of intermittent tachycardia-being seen at the request of Garnet Sierras, NP & Vicie Mutters, PA-C / Unk Pinto, MD.   Samantha Mckinney was last seen on January 02, 2020 by  Garnet Sierras, NP noting issues with fast heart rates ranging in the 100-120 range as well as abdominal bloating and back pain.  She noted a peak heart rate of 110 bpm while in walking that morning.  Pulse was 80 and the  Recent Hospitalizations: None  Reviewed  CV studies:    The following studies were reviewed today: (if available, images/films reviewed: From Epic Chart or Care Everywhere) . none:   Interval History:   Samantha Mckinney is here today mostly because of the recommendation of her husband who I am seeing later on today.  [name] has a apple watch that has been showing a wide range of heart rates with irregular rhythms over the last several months.  She really did not start hurting until earlier this month.  We have basely since she had a bout of diverticulitis back in April, she has been having some intermittent GI issues and has not really felt quite right.  She woke up 1 morning with the apple watch warning her about any irregular fast heart rhythm so she went to her PCPs office.  She said her heart rate went up as high as 210 beats a minute and went as low as 40 beats a minute.  She says these episodes usually occur at rest but can also occur when she is walking.  She feels a little bit dizzy but really had not  thought about them that much, and when she went back to look at her watch she has had the similar findings for the last month or so. Sometimes when the heart rate goes fast she will feel a little bit dizzy and tightness in her chest mostly she just feels a little flutter.  Of late, now that she was warned by her watch, she is becoming more concerned about it.    She has been noticing a left-sided sharp stabbing type discomfort that is mostly times at rest and sometimes after she wakes up.  It is not necessarily associated with the palpitations and rapid heart rates. Excellent since her bout with diverticulitis, she has not really been all that active and has gotten out of shape.  She is quite deconditioned has had some worsening exertional dyspnea but this is chronic and she has gained some weight.  She is little worried because her brother was just diagnosed with some type of valve problem and fast heart rhythm issues.  She says that these fast heart rate spells that she feels may be happen once or twice a day but the monitor she is on more frequently.  She showed me her watch findings showing heart rates all over the place.  There are some rates as high as 190 or 200 beats a minute and then sometimes low as 40 beats minute.  Unfortunately, I am not able to see the true rhythm.  She says that most the time these episodes do not occur with exertion but that sometimes when she is walking heart rate goes fast it goes going faster and takes longer to come back down.  She is concerned because she supposed to be having some type of injection by Dr. Delrae Sawyers for her joint pain.  He was waiting on her being seen.  CV Review of Symptoms (Summary) Cardiovascular ROS: positive for - chest pain, dyspnea on exertion, palpitations, rapid heart rate and Described above negative for - edema, orthopnea, paroxysmal nocturnal dyspnea, shortness of breath or Syncope/near syncope or TIA/amaurosis fugax, claudication  The  patient does not have symptoms concerning for COVID-19 infection (fever, chills, cough, or new shortness of breath).   REVIEWED OF SYSTEMS   Review of Systems  Constitutional: Positive for malaise/fatigue (She has felt little more tired than usual). Negative for weight loss.  HENT: Negative for congestion and nosebleeds.   Respiratory: Negative for cough and shortness of breath (Only when her heart rate gets really fast).   Gastrointestinal: Negative for blood in stool, constipation and melena.       She has had off-and-on abdominal pain and irregular bowels since her bout with diverticulitis, but it is getting better.  Musculoskeletal: Positive for joint pain (Pending joint injection) and neck pain.  Neurological: Positive for dizziness. Negative for focal weakness.  Psychiatric/Behavioral: The patient is nervous/anxious and has insomnia.    I have reviewed and (if needed) personally updated the patient's problem list, medications, allergies, past medical and surgical history, social and family history.   PAST MEDICAL HISTORY   Past Medical History:  Diagnosis Date  . Allergy   . Cataract   . Colon polyps   . Elevated cholesterol   . Endometriosis 02/02/2017  . Factor V Leiden ([place]) 02/02/2017  . GERD (gastroesophageal reflux disease)   . IBS (irritable bowel syndrome)   . Liver hemangioma 02/02/2017  . NASH (nonalcoholic steatohepatitis) 02/02/2017    PAST SURGICAL HISTORY   Past Surgical History:  Procedure Laterality Date  . ABDOMINAL HYSTERECTOMY  1998   TOTAL, on estrogen  until 2012  . BACK SURGERY  2000   disc removed L4L5  . CHOLECYSTECTOMY    . COLONOSCOPY    . ESOPHAGOGASTRODUODENOSCOPY  2014   In [place]  . HEMORRHOID SURGERY  1996  . LAPAROSCOPIC ENDOMETRIOSIS FULGURATION     x 6, 1980-1991  . UPPER GASTROINTESTINAL ENDOSCOPY      Immunization History  Administered Date(s) Administered  . Tdap 02/21/2018    MEDICATIONS/ALLERGIES   Current Meds   Medication Sig  . BIOTIN PO Take 5,000 mcg by mouth daily.   . carvedilol (COREG) 6.25 MG tablet Take 1 tablet (6.25 mg total) by mouth 2 (two) times daily.  . cyclobenzaprine (FLEXERIL) 10 MG tablet Take 1 tablet (10 mg total) by mouth at bedtime as needed for muscle spasms.  Marland Kitchen dicyclomine (BENTYL) 10 MG capsule Take 1 capsule (10 mg total) by mouth 3 (three) times daily before meals. Trial of Bentyl 27m every 8 hours as needed.  . pantoprazole (PROTONIX) 40 MG tablet Take 1 tablet (40 mg total) by mouth 2 (two) times daily before a meal.  . thiamine (VITAMIN B-1) 100 MG tablet Take 100 mg by mouth every other day.     Allergies  Allergen Reactions  . Morphine And Related Swelling    SOCIAL HISTORY/FAMILY HISTORY   Reviewed in  Epic:  Social History   Tobacco Use  . Smoking status: Never Smoker  . Smokeless tobacco: Never Used  Vaping Use  . Vaping Use: Never used  Substance Use Topics  . Alcohol use: Yes    Comment: OCC   . Drug use: Never   Social History   Social History Narrative   Not able to exercise 2/2 back pain   Family History  Problem Relation Age of Onset  . Cirrhosis Mother   . Diabetes Mother   . Arthritis Mother   . Gout Mother   . Depression Mother   . Bowel Disease Mother   . Heart disease Father   . Diabetes Father   . Heart attack Father 51       during knee surgery  . Valvular heart disease Sister        had valve surgery  . Depression Sister   . Diabetes Brother   . Factor V Leiden deficiency Brother        with blood clots  . Hyperlipidemia Son   . Cancer Maternal Grandmother 26       colon cancer  . Colon cancer Maternal Grandmother   . Atrial fibrillation Brother   . Valvular heart disease Brother        valve surgery  . Hypertension Brother   . Esophageal cancer Neg Hx   . Rectal cancer Neg Hx   . Stomach cancer Neg Hx    .-- Brother - blood clotting issues (DVT/PE)   OBJCTIVE -PE, EKG, labs   Wt Readings from Last 3  Encounters:  01/07/20 177 lb 9.6 oz (80.6 kg)  01/02/20 178 lb 9.6 oz (81 kg)  11/22/19 174 lb (78.9 kg)    Physical Exam: BP 136/84   Pulse 81   Ht 5' 6.5" (1.689 m)   Wt 177 lb 9.6 oz (80.6 kg)   BMI 28.24 kg/m  Physical Exam Vitals reviewed.  Constitutional:      General: She is not in acute distress (Just a little anxious).    Appearance: Normal appearance. She is normal weight. She is not ill-appearing.  HENT:     Head: Normocephalic and atraumatic.  Neck:     Vascular: No carotid bruit, hepatojugular reflux or JVD.  Cardiovascular:     Rate and Rhythm: Normal rate and regular rhythm. Occasional extrasystoles are present.    Chest Wall: PMI is not displaced.     Pulses: Intact distal pulses. No midsystolic click.     Heart sounds: Normal heart sounds. No murmur heard.  No friction rub. No gallop.   Pulmonary:     Effort: Pulmonary effort is normal. No respiratory distress.     Breath sounds: Normal breath sounds. No stridor.  Chest:     Chest wall: Tenderness (Notable tenderness to palpation along the third fourth costochondral margin as well as on the floating ribs.) present.  Abdominal:     General: Abdomen is flat. Bowel sounds are normal.     Palpations: Abdomen is soft. There is no mass (No HSM or bruit).  Musculoskeletal:        General: No swelling. Normal range of motion.     Cervical back: Normal range of motion.  Neurological:     General: No focal deficit present.     Mental Status: She is alert and oriented to person, place, and time.  Psychiatric:        Mood and Affect: Mood normal.  Behavior: Behavior normal.        Thought Content: Thought content normal.        Judgment: Judgment normal.     Adult ECG Report  Rate: 81 ;  Rhythm: normal sinus rhythm, sinus arrhythmia and Nonspecific ST-T wave changes with baseline artifact; normal axis, intervals and durations.  Narrative Interpretation: Borderline EKG.  Recent Labs:   Lab Results   Component Value Date   CHOL 226 (H) 08/08/2019   HDL 49 (L) 08/08/2019   LDLCALC 147 (H) 08/08/2019   TRIG 161 (H) 08/08/2019   CHOLHDL 4.6 08/08/2019   Lab Results  Component Value Date   CREATININE 0.76 01/02/2020   BUN 18 01/02/2020   NA 141 01/02/2020   K 4.4 01/02/2020   CL 104 01/02/2020   CO2 28 01/02/2020   Lab Results  Component Value Date   CREATININE 0.76 01/02/2020   BUN 18 01/02/2020   NA 141 01/02/2020   K 4.4 01/02/2020   CL 104 01/02/2020   CO2 28 01/02/2020    Lab Results  Component Value Date   TSH 1.34 01/02/2020    ASSESSMENT/PLAN    Problem List Items Addressed This Visit    Paroxysmal tachycardia ([place])    She clearly has episodic tachycardia that sounds like it could very well be SVT but possibly A. fib.  Rates in the 190s and 200s would be more consistent with SVT.  They have not been long lasting.  I do not want to treat until I know what it is.  Plan: 7-day Zio patch monitor--if abnormality seen, would have a low threshold to consider echocardiogram (especially with a family history of valvular disease, even though I do not hear anything abnormal on exam);   She was having some chest discomfort but it does not seem to be ischemic in nature--only if Zio patch monitor shows concerning arrhythmias, would we also consider stress test      Relevant Orders   EKG 12-Lead (Completed)   LONG TERM MONITOR-LIVE TELEMETRY (3-14 DAYS)     COVID-19 Education: The signs and symptoms of COVID-19 were discussed with the patient and how to seek care for testing (follow up with PCP or arrange E-visit).   The importance of social distancing and COVID-19 vaccination was discussed today.  The patient is practicing social distancing & Masking.   I spent a total of 6mnutes with the patient spent in direct patient consultation.  Additional time spent with chart review  / charting (studies, outside notes, etc): 14 Total Time: 45 min   Current medicines are  reviewed at length with the patient today.  (+/- concerns) none  Notice: This dictation was prepared with Dragon dictation along with smaller phrase technology. Any transcriptional errors that result from this process are unintentional and may not be corrected upon review.  Patient Instructions / Medication Changes & Studies & Tests Ordered   Patient Instructions  Medication Instructions:  NO CHANGES *If you need a refill on your cardiac medications before your next appointment, please call your pharmacy*   Lab Work: NOT NEEDED If you have labs (blood work) drawn today and your tests are completely normal, you will receive your results only by: .Marland KitchenMyChart Message (if you have MyChart) OR . A paper copy in the mail If you have any lab test that is abnormal or we need to change your treatment, we will call you to review the results.   Testing/Procedures: Your physician has recommended that you wear  a 7 DAY ZIO-PATCH monitor. The Zio patch cardiac monitor continuously records heart rhythm data, this is for patients being evaluated for multiple types heart rhythms. For the first 24 hours post application, please avoid getting the Zio monitor wet in the shower or by excessive sweating during exercise. After that, feel free to carry on with regular activities. Keep soaps and lotions away from the ZIO XT Patch.  Someone will be calling you to let you know when this is mailed.  This will be mailed to you, please expect 7-10 days to receive.      Call [facility] at [phone] if you have questions regarding your ZIO XT patch monitor.  Call them immediately if you see an orange light blinking on your monitor.   If your monitor falls off in less than 4 days contact our Monitor department at [phone].  If your monitor becomes loose or falls off after 4 days call [employer] at [phone] for suggestions on securing your monitor    Follow-Up: At [facility], you  and your health needs are our priority.  As part of our continuing mission to provide you with exceptional heart care, we have created designated Provider Care Teams.  These Care Teams include your primary Cardiologist (physician) and Advanced Practice Providers (APPs -  Physician Assistants and Nurse Practitioners) who all work together to provide you with the care you need, when you need it.    Your next appointment:   7 week(s)  The format for your next appointment:   In Person  Provider:   Glenetta Hew, MD   Other Instructions IT IS OKAY FOR YOUR TO PROCEED WITH BACK  INJECTION FOR DR Drema Halon- Long Term Monitor Instructions   Your physician has requested you wear your ZIO patch monitor___7____days.   This is a single patch monitor.  [employer] supplies one patch monitor per enrollment.  Additional stickers are not available.   Please do not apply patch if you will be having a Nuclear Stress Test, Echocardiogram, Cardiac CT, MRI, or Chest Xray during the time frame you would be wearing the monitor. The patch cannot be worn during these tests.  You cannot remove and re-apply the ZIO XT patch monitor.   Your ZIO patch monitor will be sent USPS Priority mail from [facility] directly to your home address. The monitor may also be mailed to a PO BOX if home delivery is not available.   It may take 3-5 days to receive your monitor after you have been enrolled.   Once you have received you monitor, please review enclosed instructions.  Your monitor has already been registered assigning a specific monitor serial # to you.   Applying the monitor   Shave hair from upper left chest.   Hold abrader disc by orange tab.  Rub abrader in 40 strokes over left upper chest as indicated in your monitor instructions.   Clean area with 4 enclosed alcohol pads .  Use all pads to assure are is cleaned thoroughly.  Let dry.   Apply patch as indicated in monitor instructions.  Patch will  be place under collarbone on left side of chest with arrow pointing upward.   Rub patch adhesive wings for 2 minutes.Remove white label marked "1".  Remove white label marked "2".  Rub patch adhesive wings for 2 additional minutes.   While looking in a mirror, press and release button in center of patch.  A small green light will flash  3-4 times .  This will be your only indicator the monitor has been turned on.     Do not shower for the first 24 hours.  You may shower after the first 24 hours.   Press button if you feel a symptom. You will hear a small click.  Record Date, Time and Symptom in the Patient Log Book.   When you are ready to remove patch, follow instructions on last 2 pages of Patient Log Book.  Stick patch monitor onto last page of Patient Log Book.   Place Patient Log Book in [place] box.  Use locking tab on box and tape box closed securely.  The [employer] and [employer] has [employer] on it.  Please place in mailbox as soon as possible.  Your physician should have your test results approximately 7 days after the monitor has been mailed back to [facility].   Call [employer] at [phone] if you have questions regarding your ZIO XT patch monitor.  Call them immediately if you see an orange light blinking on your monitor.   If your monitor falls off in less than 4 days contact our Monitor department at [phone].  If your monitor becomes loose or falls off after 4 days call [employer] at [phone] for suggestions on securing your monitor.      Studies Ordered:   Orders Placed This Encounter  Procedures  . LONG TERM MONITOR-LIVE TELEMETRY (3-14 DAYS)  . EKG 12-Lead     Glenetta Hew, M.D., M.S. Interventional Cardiologist   Pager # [phone] Phone # [phone] [address] [place], [place] [address]   Thank you for choosing [facility] at [facility]!!

## 2020-01-09 NOTE — Addendum Note (Signed)
Addended byGarnet Sierras A on: 01/09/2020 11:37 AM   Modules accepted: Orders

## 2020-01-10 ENCOUNTER — Encounter: Payer: Self-pay | Admitting: Cardiology

## 2020-01-10 ENCOUNTER — Telehealth: Payer: Self-pay | Admitting: Cardiology

## 2020-01-10 NOTE — Telephone Encounter (Signed)
Returned call to patient, she received her monitor but she is going to be at the beach next [place] and she would like to wait until Oct 2nd to place this.   She will place it October 2nd when she is returning and wear for 7 days.  She had let the company know.  She is wondering if she should continue to carvedilol until she placed the monitor.  She states Dr. Ellyn Hack wanted to hold this while monitor was on.  Advised would continue until she wears the monitor if he wanted to hold this during.    Advised would verify with him and call back if any other recommendations/instructions.

## 2020-01-10 NOTE — Telephone Encounter (Signed)
Is fine if she takes it now, but for 3 days leading up to taking it, she should take it once a day, and then stop it for while she is wearing the monitor.  When she turns monitoring, go back to taking it twice a day.  Glenetta Hew, MD

## 2020-01-10 NOTE — Telephone Encounter (Signed)
New message:   Patient is going to the beach and she has monitor and it came in, but she has some question.

## 2020-01-10 NOTE — Assessment & Plan Note (Signed)
She clearly has episodic tachycardia that sounds like it could very well be SVT but possibly A. fib.  Rates in the 190s and 200s would be more consistent with SVT.  They have not been long lasting.  I do not want to treat until I know what it is.  Plan: 7-day Zio patch monitor--if abnormality seen, would have a low threshold to consider echocardiogram (especially with a family history of valvular disease, even though I do not hear anything abnormal on exam);   She was having some chest discomfort but it does not seem to be ischemic in nature--only if Zio patch monitor shows concerning arrhythmias, would we also consider stress test

## 2020-01-10 NOTE — Telephone Encounter (Signed)
Left detailed message (ok per DPR) to make patient aware. Advised to call back with questions

## 2020-01-18 ENCOUNTER — Ambulatory Visit (INDEPENDENT_AMBULATORY_CARE_PROVIDER_SITE_OTHER): Payer: 59

## 2020-01-18 DIAGNOSIS — I479 Paroxysmal tachycardia, unspecified: Secondary | ICD-10-CM | POA: Diagnosis not present

## 2020-02-13 ENCOUNTER — Other Ambulatory Visit: Payer: Self-pay | Admitting: Physician Assistant

## 2020-02-13 DIAGNOSIS — Z1231 Encounter for screening mammogram for malignant neoplasm of breast: Secondary | ICD-10-CM

## 2020-02-24 ENCOUNTER — Telehealth: Payer: Self-pay | Admitting: Cardiology

## 2020-02-24 NOTE — Telephone Encounter (Signed)
Pt states she was notified today that Dr. Ellyn Hack needed to cancel her appt for 11/16 and there was nothing available with him until 04/28/20. Pt does not want to push appointment that far because she needs to get the results of her cardiac monitor and to discuss continued heart rate variability. Scheduled pt with Coletta Memos, NP on 03/04/20 at 3:15 p.m. Pt verbalizes understanding and agrees with plan.

## 2020-02-24 NOTE — Telephone Encounter (Signed)
New message:     Patient would like to know what is her results. Patient BP is going up and down. Also Dr. Ellyn Hack cancel her apt he will not be in the office 03/03/20. Patient would like to see the doctor. Nothing till 04/28/20

## 2020-03-02 ENCOUNTER — Ambulatory Visit: Payer: 59 | Admitting: Cardiology

## 2020-03-02 NOTE — Progress Notes (Signed)
Cardiology Clinic Note   Patient Name: Samantha Mckinney Date of Encounter: 03/04/2020  Primary Care Provider:  Unk Pinto, MD Primary Cardiologist:  Glenetta Hew, MD  Patient Profile    Samantha Mckinney 64 year old female presents to the clinic today for a review of her cardiac event monitor.  Past Medical History    Past Medical History:  Diagnosis Date  . Allergy   . Cataract   . Colon polyps   . Elevated cholesterol   . Endometriosis 02/02/2017  . Factor V Leiden ([place]) 02/02/2017  . GERD (gastroesophageal reflux disease)   . IBS (irritable bowel syndrome)   . Liver hemangioma 02/02/2017  . NASH (nonalcoholic steatohepatitis) 02/02/2017   Past Surgical History:  Procedure Laterality Date  . ABDOMINAL HYSTERECTOMY  1998   TOTAL, on estrogen  until 2012  . BACK SURGERY  2000   disc removed L4L5  . CHOLECYSTECTOMY    . COLONOSCOPY    . ESOPHAGOGASTRODUODENOSCOPY  2014   In [place]  . HEMORRHOID SURGERY  1996  . LAPAROSCOPIC ENDOMETRIOSIS FULGURATION     x 6, 1980-1991  . UPPER GASTROINTESTINAL ENDOSCOPY      Allergies  Allergies  Allergen Reactions  . Morphine And Related Swelling    History of Present Illness    She has a PMH of paroxysmal tachycardia, nonalcoholic steatohepatitis, CKD, factor V Leiden, mixed hyperlipidemia, lung nodule, vitamin D deficiency, and low back pain.  She was last seen by Dr. Ellyn Hack on 01/07/2020.  She was referred by Samantha Sierras, NP and Vicie Mutters, PA-C/Samantha McKeown MD for evaluation of her intermittent tachycardia.  She has an apple watch that was showing a wide range of heart rates with irregular rhythms over the past several months.  She noted some pain in April 2021 and was having intermittent GI issues.  She indicated she did not feel quite right.  She reported waking up one morning with an apple watch warning about heart rate regular fast heart rhythm.  She contacted her PCP office.  She reported that her heart rate  was as high as 210 bpm and went as low as 40 bpm.  She reported that the episodes usually occurred at rest but, could also occur while she was walking.  She reported feeling a little bit dizzy but had not really thought about the symptoms that much.  She reviewed her previous apple watch findings which showed similar readings 1 month prior.  She reported her main symptom was tightness in her chest and a little flutter.  She became more concerned after she observed a warning on her watch.  She also reported that she was concerned because her brother had been diagnosed with a valve problem and fast heart rate/rhythm issues.  A 7-day cardiac event monitor was ordered.  She presents to the clinic today to review her cardiac event monitor results from 02/03/2020.  They showed a min HR of 50 bpm, max HR of 133 bpm, and avg HR of 78 bpm. Predominant underlying rhythm was Sinus Rhythm. 1 run ofVentricular Tachycardia occurred lasting 9 beats with a max rate of 122 bpm (avg 106 bpm). Isolated SVEs were rare (<1.0%), SVE Couplets were rare (<1.0%), and no SVE Triplets were present. Isolated VEs were rare (<1.0%), VE Couplets were rare (<1.0%), and no VE Triplets were present.  No atrial fibrillation, blocks or cardiac pauses were observed.  Patient triggered events were sinus tachycardia.  She presents the clinic today for follow-up evaluation and states during her cardiac  event monitor [place] the [place] and had been off her carvedilol.  She initially presented to her PCP who placed her on 6.25 mg twice daily which she did not tolerate well.  She is now taking 3.125 mg twice daily.  She is tolerating this much better.  We reviewed her cardiac event monitor results and her event triggers.  She states that while on the carvedilol her palpitations fairly well controlled.  She states she still notices them and shows me her history which is tracked by her apple watch.  We also reviewed p.o. hydration which she reports she  had not been hydrating well.  She also indicates that her exercise has substantially decreased due to left backslash hip orthopedic issues.  She is receiving injections into her left hip and will start intensive physical therapy.  We reviewed triggers to avoid and I will also give her the mindful stress reduction sheet.  She recently has had increased stress due to a relative in [place] time from a Covid infection.  I'll have her follow-up in 12 months and as needed.  Today she denies chest pain, shortness of breath, lower extremity edema, fatigue, palpitations, melena, hematuria, hemoptysis, diaphoresis, weakness, presyncope, syncope, orthopnea, and PND.   Home Medications    Prior to Admission medications   Medication Sig Start Date End Date Taking? Authorizing Provider  BIOTIN PO Take 5,000 mcg by mouth daily.     [provider]  carvedilol (COREG) 6.25 MG tablet Take 1 tablet (6.25 mg total) by mouth 2 (two) times daily. 01/03/20 01/02/21  Samantha Sierras, NP  cyclobenzaprine (FLEXERIL) 10 MG tablet Take 1 tablet (10 mg total) by mouth at bedtime as needed for muscle spasms. 02/26/18   Vladimir Crofts, PA-C  dicyclomine (BENTYL) 10 MG capsule Take 1 capsule (10 mg total) by mouth 3 (three) times daily before meals. Trial of Bentyl 48m every 8 hours as needed. 11/22/19   Armbruster, SCarlota Raspberry MD  pantoprazole (PROTONIX) 40 MG tablet Take 1 tablet (40 mg total) by mouth 2 (two) times daily before a meal. 10/16/19 10/15/20  CVladimir Crofts PA-C  thiamine (VITAMIN B-1) 100 MG tablet Take 100 mg by mouth every other day.     [provider]    Family History    Family History  Problem Relation Age of Onset  . Cirrhosis Mother   . Diabetes Mother   . Arthritis Mother   . Gout Mother   . Depression Mother   . Bowel Disease Mother   . Heart disease Father   . Diabetes Father   . Heart attack Father 852      during knee surgery  . Valvular heart disease Sister         had valve surgery  . Depression Sister   . Diabetes Brother   . Factor V Leiden deficiency Brother        with blood clots  . Hyperlipidemia Son   . Cancer Maternal Grandmother 675      colon cancer  . Colon cancer Maternal Grandmother   . Atrial fibrillation Brother   . Valvular heart disease Brother        valve surgery  . Hypertension Brother   . Esophageal cancer Neg Hx   . Rectal cancer Neg Hx   . Stomach cancer Neg Hx    She indicated that her mother is deceased. She indicated that her father is deceased. She indicated that her sister is  alive. She indicated that all of her three brothers are alive. She indicated that her maternal grandmother is deceased. She indicated that her son is alive. She indicated that the status of her neg hx is unknown.  Social History    Social History   Socioeconomic History  . Marital status: Married    Spouse name: Not on file  . Number of children: 1  . Years of education: Not on file  . Highest education level: Not on file  Occupational History    Employer: [employer]  Tobacco Use  . Smoking status: Never Smoker  . Smokeless tobacco: Never Used  Vaping Use  . Vaping Use: Never used  Substance and Sexual Activity  . Alcohol use: Yes    Comment: OCC   . Drug use: Never  . Sexual activity: Not Currently    Partners: Male    Comment: 1st intercourse- 60, partners- 80, married- 34 yrs   Other Topics Concern  . Not on file  Social History Narrative   Not able to exercise 2/2 back pain   Social Determinants of Health   Financial Resource Strain:   . Difficulty of Paying Living Expenses: Not on file  Food Insecurity:   . Worried About Charity fundraiser in the Last Year: Not on file  . Ran Out of Food in the Last Year: Not on file  Transportation Needs:   . Lack of Transportation (Medical): Not on file  . Lack of Transportation (Non-Medical): Not on file  Physical Activity:   . Days of Exercise per Week: Not on file   . Minutes of Exercise per Session: Not on file  Stress:   . Feeling of Stress : Not on file  Social Connections:   . Frequency of Communication with Friends and Family: Not on file  . Frequency of Social Gatherings with Friends and Family: Not on file  . Attends Religious Services: Not on file  . Active Member of Clubs or Organizations: Not on file  . Attends Archivist Meetings: Not on file  . Marital Status: Not on file  Intimate Partner Violence:   . Fear of Current or Ex-Partner: Not on file  . Emotionally Abused: Not on file  . Physically Abused: Not on file  . Sexually Abused: Not on file     Review of Systems    General:  No chills, fever, night sweats or weight changes.  Cardiovascular:  No chest pain, dyspnea on exertion, edema, orthopnea, palpitations, paroxysmal nocturnal dyspnea. Dermatological: No rash, lesions/masses Respiratory: No cough, dyspnea Urologic: No hematuria, dysuria Abdominal:   No nausea, vomiting, diarrhea, bright red blood per rectum, melena, or hematemesis Neurologic:  No visual changes, wkns, changes in mental status. All other systems reviewed and are otherwise negative except as noted above.  Physical Exam    VS:  BP 120/70   Pulse 67   Wt 117 lb (53.1 kg)   SpO2 98%   BMI 18.60 kg/m  , BMI Body mass index is 18.6 kg/m. GEN: Well nourished, well developed, in no acute distress. HEENT: normal. Neck: Supple, no JVD, carotid bruits, or masses. Cardiac: RRR, no murmurs, rubs, or gallops. No clubbing, cyanosis, edema.  Radials/DP/PT 2+ and equal bilaterally.  Respiratory:  Respirations regular and unlabored, clear to auscultation bilaterally. GI: Soft, nontender, nondistended, BS + x 4. MS: no deformity or atrophy. Skin: warm and dry, no rash. Neuro:  Strength and sensation are intact. Psych: Normal affect.  Accessory  Clinical Findings    Recent Labs: 10/16/2019: Magnesium 2.4 01/02/2020: ALT 18; BUN 18; Creat 0.76; Hemoglobin  14.4; Platelets 255; Potassium 4.4; Sodium 141; TSH 1.34   Recent Lipid Panel    Component Value Date/Time   CHOL 226 (H) 08/08/2019 1605   TRIG 161 (H) 08/08/2019 1605   HDL 49 (L) 08/08/2019 1605   CHOLHDL 4.6 08/08/2019 1605   LDLCALC 147 (H) 08/08/2019 1605    ECG personally reviewed by me today-none today.  Cardiac event monitor 02/03/2020  a min HR of 50 bpm, max HR of 133 bpm, and avg HR of 78 bpm. Predominant underlying rhythm was Sinus Rhythm. 1 run ofVentricular Tachycardia occurred lasting 9 beats with a max rate of 122 bpm (avg 106 bpm). Isolated SVEs were rare (<1.0%), SVE Couplets were rare (<1.0%), and no SVE Triplets were present. Isolated VEs were rare (<1.0%), VE Couplets were rare (<1.0%), and no VE Triplets were present.  No atrial fibrillation, blocks or cardiac pauses were observed.  Patient triggered events were sinus tachycardia.   Assessment & Plan   1.  Paroxysmal tachycardia- HR today 67 BPM.  Palpitations better with being back on carvedilol.  Long-term cardiac event monitor results from 02/03/2020 results/details listed above. Continue carvedilol Increase physical activity as tolerated Increase p.o. hydration Heart healthy low-sodium diet-salty 6 given Avoid triggers caffeine, chocolate, EtOH etc. Mindfulness stress reduction sheet given.  Disposition: Follow-up with Dr. Ellyn Hack or me in 1 year.   Jossie Ng. Charice Zuno NP-C    03/04/2020, 4:22 PM [facility] [address] Office [phone] Fax [phone]  Notice: This dictation was prepared with Dragon dictation along with smaller phrase technology. Any transcriptional errors that result from this process are unintentional and may not be corrected upon review.

## 2020-03-03 ENCOUNTER — Ambulatory Visit: Payer: 59 | Admitting: Cardiology

## 2020-03-04 ENCOUNTER — Encounter: Payer: 59 | Admitting: Physician Assistant

## 2020-03-04 ENCOUNTER — Encounter: Payer: Self-pay | Admitting: General Practice

## 2020-03-04 ENCOUNTER — Ambulatory Visit (INDEPENDENT_AMBULATORY_CARE_PROVIDER_SITE_OTHER): Payer: 59 | Admitting: General Practice

## 2020-03-04 ENCOUNTER — Other Ambulatory Visit: Payer: Self-pay

## 2020-03-04 VITALS — BP 120/70 | HR 67 | Wt 117.0 lb

## 2020-03-04 DIAGNOSIS — I479 Paroxysmal tachycardia, unspecified: Secondary | ICD-10-CM

## 2020-03-04 NOTE — Patient Instructions (Signed)
Medication Instructions:  The current medical regimen is effective;  continue present plan and medications as directed. Please refer to the Current Medication list given to you today.  *If you need a refill on your cardiac medications before your next appointment, please call your pharmacy*  Lab Work:   Testing/Procedures:  NONE    NONE  Special Instructions PLEASE READ AND FOLLOW STRESS REDUCTION TIPS-ATTACHED  PLEASE READ AND FOLLOW SALTY 6-ATTACHED-1,877m daily  PLEASE INCREASE PHYSICAL ACTIVITY AS TOLERATED  PLEASE INCREASE HYDRATION  Follow-Up: Your next appointment:  12 month(s) In Person with DGlenetta Hew MD OR IF UNAVAILABLE JESSE CLEAVER, FNP-C Please call our office 2 months in advance to schedule this appointment   [facility] and your health needs are our priority.  As part of our continuing mission to provide you with exceptional heart care, we have created designated Provider Care Teams.  These Care Teams include your primary Cardiologist (physician) and Advanced Practice Providers (APPs -  Physician Assistants and Nurse Practitioners) who all work together to provide you with the care you need, when you need it.            6 SALTY THINGS TO AVOID     1,800MG DAILY     Mindfulness-Based Stress Reduction Mindfulness-based stress reduction (MBSR) is a program that helps people learn to practice mindfulness. Mindfulness is the practice of intentionally paying attention to the present moment. It can be learned and practiced through techniques such as education, breathing exercises, meditation, and yoga. MBSR includes several mindfulness techniques in one program. MBSR works best when you understand the treatment, are willing to try new things, and can commit to spending time practicing what you learn. MBSR training may include learning about:  How your emotions, thoughts, and reactions affect your body.  New ways to respond to things that cause negative  thoughts to start (triggers).  How to notice your thoughts and let go of them.  Practicing awareness of everyday things that you normally do without thinking.  The techniques and goals of different types of meditation. What are the benefits of MBSR? MBSR can have many benefits, which include helping you to:  Develop self-awareness. This refers to knowing and understanding yourself.  Learn skills and attitudes that help you to participate in your own health care.  Learn new ways to care for yourself.  Be more accepting about how things are, and let things go.  Be less judgmental and approach things with an open mind.  Be patient with yourself and trust yourself more. MBSR has also been shown to:  Reduce negative emotions, such as depression and anxiety.  Improve memory and focus.  Change how you sense and approach pain.  Boost your body's ability to fight infections.  Help you connect better with other people.  Improve your sense of well-being. Follow these instructions at home:   Find a local in-person or online MBSR program.  Set aside some time regularly for mindfulness practice.  Find a mindfulness practice that works best for you. This may include one or more of the following: ? Meditation. Meditation involves focusing your mind on a certain thought or activity. ? Breathing awareness exercises. These help you to stay present by focusing on your breath. ? Body scan. For this practice, you lie down and pay attention to each part of your body from head to toe. You can identify tension and soreness and intentionally relax parts of your body. ? Yoga. Yoga involves stretching and  breathing, and it can improve your ability to move and be flexible. It can also provide an experience of testing your body's limits, which can help you release stress. ? Mindful eating. This way of eating involves focusing on the taste, texture, color, and smell of each bite of food. Because this  slows down eating and helps you feel full sooner, it can be an important part of a weight-loss plan.  Find a podcast or recording that provides guidance for breathing awareness, body scan, or meditation exercises. You can listen to these any time when you have a free moment to rest without distractions.  Follow your treatment plan as told by your health care provider. This may include taking regular medicines and making changes to your diet or lifestyle as recommended. How to practice mindfulness To do a basic awareness exercise:  Find a comfortable place to sit.  Pay attention to the present moment. Observe your thoughts, feelings, and surroundings just as they are.  Avoid placing judgment on yourself, your feelings, or your surroundings. Make note of any judgment that comes up, and let it go.  Your mind may wander, and that is okay. Make note of when your thoughts drift, and return your attention to the present moment. To do basic mindfulness meditation:  Find a comfortable place to sit. This may include a stable chair or a firm floor cushion. ? Sit upright with your back straight. Let your arms fall next to your side with your hands resting on your legs. ? If sitting in a chair, rest your feet flat on the floor. ? If sitting on a cushion, cross your legs in front of you.  Keep your head in a neutral position with your chin dropped slightly. Relax your jaw and rest the tip of your tongue on the roof of your mouth. Drop your gaze to the floor. You can close your eyes if you like.  Breathe normally and pay attention to your breath. Feel the air moving in and out of your nose. Feel your belly expanding and relaxing with each breath.  Your mind may wander, and that is okay. Make note of when your thoughts drift, and return your attention to your breath.  Avoid placing judgment on yourself, your feelings, or your surroundings. Make note of any judgment or feelings that come up, let them go,  and bring your attention back to your breath.  When you are ready, lift your gaze or open your eyes. Pay attention to how your body feels after the meditation. Where to find more information You can find more information about MBSR from:  Your health care provider.  Community-based meditation centers or programs.  Programs offered near you. Summary  Mindfulness-based stress reduction (MBSR) is a program that teaches you how to intentionally pay attention to the present moment. It is used with other treatments to help you cope better with daily stress, emotions, and pain.  MBSR focuses on developing self-awareness, which allows you to respond to life stress without judgment or negative emotions.  MBSR programs may involve learning different mindfulness practices, such as breathing exercises, meditation, yoga, body scan, or mindful eating. Find a mindfulness practice that works best for you, and set aside time for it on a regular basis. This information is not intended to replace advice given to you by your health care provider. Make sure you discuss any questions you have with your health care provider.

## 2020-03-10 ENCOUNTER — Telehealth: Payer: Self-pay | Admitting: Cardiology

## 2020-03-10 NOTE — Telephone Encounter (Signed)
So the prescription was written by her PCP, and she was taking it back in September, and was noting similar type symptoms then. Unfortunately, according to [facility] note, she was not taking the carvedilol when the monitor was checked.  But then she had reduced the carvedilol down to 1/2 tablet twice daily.  My understanding was is on 1/2 tablet twice daily.  I need to know if she is taking a full tablet twice a day or not because it depends on how we do the taper.  I do not think that her symptoms are being caused by carvedilol, but is certainly worth a try to hold, but the benefits of the carvedilol will then be lost sleep which is blood pressure and rate control.  We need to contact her to find out what dose she is truly taking.  She is taking 6.25 mg twice daily then she needs cut down to half tablet twice daily for 2 weeks and see what how she feels.  If she feels better then discontinue with a half dose.  If she does not have any improvement in symptoms, then take 1/2 tablet daily for a week and then stop.  Obviously, she is only taking 1/2 tablet twice daily, then she would simply take 1/2 tablet daily for a week and stop.  She has acknowledge though that by stopping the beta-blocker, tachycardia spells will likely get worse.     Glenetta Hew, MD

## 2020-03-10 NOTE — Telephone Encounter (Signed)
Pt c/o medication issue:  1. Name of Medication: carvedilol (COREG) 6.25 MG tablet  2. How are you currently taking this medication (dosage and times per day)? Half a tablet twice a day  3. Are you having a reaction (difficulty breathing--STAT)? no  4. What is your medication issue? Patient states she is getting nauseas after taking the medication. She would like to know if she can wean off of it.

## 2020-03-10 NOTE — Telephone Encounter (Signed)
Spoke to patient she stated since she has been taking Coreg 6.25 mg twice a day she has been lightheaded,nausea,upper abd pain.States she does not have a rash,but she has been itching all over.Stated she would like to wean off to see if symptoms will go away.She wanted to ask Dr.Harding how to wean off and get his advice.Advised I will send message to him.

## 2020-03-11 NOTE — Telephone Encounter (Signed)
Spoke to patient she stated she is taking Coreg 6.25 mg 1/2 tablet twice a day.Dr.Harding advised to take 1/2 tablet daily for 1 week then stop.Advised she may have have tachycardia.Stated she will call back if she has tachycardia.

## 2020-03-16 ENCOUNTER — Encounter: Payer: Self-pay | Admitting: *Deleted

## 2020-03-17 ENCOUNTER — Encounter: Payer: Self-pay | Admitting: Adult Health

## 2020-03-17 ENCOUNTER — Ambulatory Visit (HOSPITAL_COMMUNITY)
Admission: RE | Admit: 2020-03-17 | Discharge: 2020-03-17 | Disposition: A | Discharge status: Home / Self Care | Payer: 59 | Source: Ambulatory Visit | Attending: Pulmonary Disease | Admitting: Pulmonary Disease

## 2020-03-17 ENCOUNTER — Other Ambulatory Visit: Payer: Self-pay | Admitting: Adult Health

## 2020-03-17 ENCOUNTER — Telehealth (HOSPITAL_COMMUNITY): Payer: Self-pay | Admitting: Emergency Medicine

## 2020-03-17 ENCOUNTER — Other Ambulatory Visit (HOSPITAL_COMMUNITY): Payer: Self-pay | Admitting: Emergency Medicine

## 2020-03-17 DIAGNOSIS — U071 COVID-19: Secondary | ICD-10-CM

## 2020-03-17 MED ORDER — FAMOTIDINE IN NACL 20-0.9 MG/50ML-% IV SOLN: 20.0000 mg | Freq: Once | INTRAVENOUS | Status: DC | PRN

## 2020-03-17 MED ORDER — SOTROVIMAB 500 MG/8ML IV SOLN
500.0000 mg | Freq: Once | INTRAVENOUS | Status: AC
  Administered 2020-03-17: 500 mg via INTRAVENOUS

## 2020-03-17 MED ORDER — METHYLPREDNISOLONE SODIUM SUCC 125 MG IJ SOLR: 125.0000 mg | Freq: Once | INTRAMUSCULAR | Status: DC | PRN

## 2020-03-17 MED ORDER — EPINEPHRINE 0.3 MG/0.3ML IJ SOAJ: 0.3000 mg | Freq: Once | INTRAMUSCULAR | Status: DC | PRN

## 2020-03-17 MED ORDER — ACETAMINOPHEN 325 MG PO TABS
650.0000 mg | ORAL_TABLET | Freq: Once | ORAL | Status: AC
  Administered 2020-03-17: 650 mg via ORAL
  Filled 2020-03-17: qty 2

## 2020-03-17 MED ORDER — SODIUM CHLORIDE 0.9 % IV SOLN: INTRAVENOUS | Status: DC | PRN

## 2020-03-17 MED ORDER — ALBUTEROL SULFATE HFA 108 (90 BASE) MCG/ACT IN AERS: 2.0000 | INHALATION_SPRAY | Freq: Once | RESPIRATORY_TRACT | Status: DC | PRN

## 2020-03-17 MED ORDER — DIPHENHYDRAMINE HCL 50 MG/ML IJ SOLN: 50.0000 mg | Freq: Once | INTRAMUSCULAR | Status: DC | PRN

## 2020-03-17 NOTE — Progress Notes (Signed)
Patient reviewed Fact Sheet for Patients, Parents, and Caregivers for Emergency Use Authorization (EUA) of Sotrovimab for the Treatment of Coronavirus. Patient also reviewed and is agreeable to the estimated cost of treatment. Patient is agreeable to proceed.   

## 2020-03-17 NOTE — Telephone Encounter (Signed)
Called pt and explained possible monoclonal antibody treatment. Sx started 11/27. Tested positive 11/28 with a home test. Will bring a photo of the positive test. Sx include diarrhea, chest congestion, temperature, cough, headache, and body aches. Qualifying risk factors include paroxysmal tachycardia. Pt is not vaccinated and does not plan to get vaccinated. Pt interested in tx. Informed pt an APP will call back to possibly schedule an appointment. Gave pt insurance CPT code  [phone] to call insurance company for coverage.

## 2020-03-17 NOTE — Discharge Instructions (Signed)
10 Things You Can Do to Manage Your COVID-19 Symptoms at Home If you have possible or confirmed COVID-19: 1. Stay home from work and school. And stay away from other public places. If you must go out, avoid using any kind of public transportation, ridesharing, or taxis. 2. Monitor your symptoms carefully. If your symptoms get worse, call your healthcare provider immediately. 3. Get rest and stay hydrated. 4. If you have a medical appointment, call the healthcare provider ahead of time and tell them that you have or may have COVID-19. 5. For medical emergencies, call 911 and notify the dispatch personnel that you have or may have COVID-19. 6. Cover your cough and sneezes with a tissue or use the inside of your elbow. 7. Wash your hands often with soap and water for at least 20 seconds or clean your hands with an alcohol-based hand sanitizer that contains at least 60% alcohol. 8. As much as possible, stay in a specific room and away from other people in your home. Also, you should use a separate bathroom, if available. If you need to be around other people in or outside of the home, wear a mask. 9. Avoid sharing personal items with other people in your household, like dishes, towels, and bedding. 10. Clean all surfaces that are touched often, like counters, tabletops, and doorknobs. Use household cleaning sprays or wipes according to the label instructions. [email or web address] 10/17/2018 This information is not intended to replace advice given to you by your health care provider. Make sure you discuss any questions you have with your health care provider. Document Revised: 03/21/2019 Document Reviewed: 03/21/2019 Elsevier Patient Education  [place].  What types of side effects do monoclonal antibody drugs cause?  Common side effects  In general, the more common side effects caused by monoclonal antibody drugs include: . Allergic reactions, such as hives or itching . Flu-like signs and  symptoms, including chills, fatigue, fever, and muscle aches and pains . Nausea, vomiting . Diarrhea . Skin rashes . Low blood pressure   The CDC is recommending patients who receive monoclonal antibody treatments wait at least 90 days before being vaccinated.  Currently, there are no data on the safety and efficacy of mRNA COVID-19 vaccines in persons who received monoclonal antibodies or convalescent plasma as part of COVID-19 treatment. Based on the estimated half-life of such therapies as well as evidence suggesting that reinfection is uncommon in the 90 days after initial infection, vaccination should be deferred for at least 90 days, as a precautionary measure until additional information becomes available, to avoid interference of the antibody treatment with vaccine-induced immune responses.  If you have any questions or concerns after the infusion please call the Advanced Practice Provider on call at [phone]. This number is only intended for your use regarding questions or concerns about the infusion post-treatment side-effects.  Please do not provide this number to others for use.   If someone you know is interested in receiving treatment please have them call the [facility] hotline at [phone].

## 2020-03-17 NOTE — Progress Notes (Signed)
Diagnosis: COVID-19  Physician: Dr. Patrick Wright  Procedure: [facility] Infusion [facility] Med: Sotrovimab infusion - Provided patient with sotrovimab fact sheet for patients, parents, and caregivers prior to infusion.   Complications: No immediate complications noted  Discharge: Discharged home    

## 2020-03-17 NOTE — Progress Notes (Signed)
I connected by phone with Theron Arista on 03/17/2020 at 11:44 AM to discuss the potential use of a new treatment for mild to moderate COVID-19 viral infection in non-hospitalized patients.  This patient is a 64 y.o. female that meets the FDA criteria for Emergency Use Authorization of COVID monoclonal antibody casirivimab/imdevimab, bamlanivimab/eteseviamb, or sotrovimab.  Has a (+) direct SARS-CoV-2 viral test result  Has mild or moderate COVID-19   Is NOT hospitalized due to COVID-19  Is within 10 days of symptom onset  Has at least one of the high risk factor(s) for progression to severe COVID-19 and/or hospitalization as defined in EUA.  Specific high risk criteria : Cardiovascular disease or hypertension   I have spoken and communicated the following to the patient or parent/caregiver regarding COVID monoclonal antibody treatment:  1. FDA has authorized the emergency use for the treatment of mild to moderate COVID-19 in adults and pediatric patients with positive results of direct SARS-CoV-2 viral testing who are 39 years of age and older weighing at least 40 kg, and who are at high risk for progressing to severe COVID-19 and/or hospitalization.  2. The significant known and potential risks and benefits of COVID monoclonal antibody, and the extent to which such potential risks and benefits are unknown.  3. Information on available alternative treatments and the risks and benefits of those alternatives, including clinical trials.  4. Patients treated with COVID monoclonal antibody should continue to self-isolate and use infection control measures (e.g., wear mask, isolate, social distance, avoid sharing personal items, clean and disinfect "high touch" surfaces, and frequent handwashing) according to CDC guidelines.   5. The patient or parent/caregiver has the option to accept or refuse COVID monoclonal antibody treatment.  After reviewing this information with the patient, the  patient has agreed to receive one of the available covid 19 monoclonal antibodies and will be provided an appropriate fact sheet prior to infusion. Scot Dock, NP 03/17/2020 11:44 AM

## 2020-03-18 ENCOUNTER — Ambulatory Visit: Payer: 59 | Admitting: Diagnostic Neuroimaging

## 2020-03-26 ENCOUNTER — Encounter: Payer: 59 | Admitting: Diagnostic Neuroimaging

## 2020-04-24 ENCOUNTER — Ambulatory Visit: Payer: 59

## 2020-05-11 ENCOUNTER — Telehealth: Payer: Self-pay | Admitting: Gastroenterology

## 2020-05-11 MED ORDER — CIPROFLOXACIN HCL 500 MG PO TABS: 500.0000 mg | ORAL_TABLET | Freq: Two times a day (BID) | ORAL | 0 refills | Status: AC

## 2020-05-11 MED ORDER — METRONIDAZOLE 500 MG PO TABS: 500.0000 mg | ORAL_TABLET | Freq: Three times a day (TID) | ORAL | 0 refills | Status: AC

## 2020-05-11 NOTE — Telephone Encounter (Signed)
Spoke with patient, she reports that last Thursday she started feeling crampy in her lower abdomen, Friday during the day she was ok but Friday night was really bad, she reports that she was only passing pus, same pain she had when she was first diagnosed with diverticulitis,she reports that Saturday she was doubled over in pain over the toilet, she did have a fever of 100.6, she has been on liquids only since Saturday, Gatorade and water. No blood in stools. Patient states that she did drink black coffee which triggered diarrhea, she states that she mostly is passing mucous and pus, she states that when the stool passes it feels like "razor blades". Patient states that she does not really have an appetite and does have nausea as well, she also stated that the fever has subsided. Please advise, thank you.

## 2020-05-11 NOTE — Telephone Encounter (Signed)
Spoke with patient in regards to Dr. Doyne Keel recommendations. Advised that I have sent in a prescription for Cipro and Flagyl to her preferred pharmacy. Patient states that she still has some Zofran left over from the last time. Advises patient that if she does not feel better despite management she will need to let us know. Patient states that if she does not feel better she will want a CT scan but will let us know. Patient verbalized understanding of all information and had no concerns at the end of the call.

## 2020-05-11 NOTE — Telephone Encounter (Signed)
Sorry to hear this. If she is having pain and this reminds her of her prior diverticulitis symptoms then I think reasonable to give her a course of antibiotics - cipro 560m BID and flagyl 5042mTID for total of one week for both. We can give Zofran 26m57mDT every 6 hours otherwise for nausea, #20 RF0. If she is feeling worse in the interim despite management she needs to let us [place], otherwise hopefully this will make her feel better. Thanks

## 2020-05-11 NOTE — Telephone Encounter (Signed)
Pt is requesting a call back from a nurse in regards to her diverticulitis flare up (nausea, diarrhea).

## 2020-05-12 ENCOUNTER — Telehealth: Payer: Self-pay | Admitting: Gastroenterology

## 2020-05-12 NOTE — Telephone Encounter (Signed)
I think that would be extremely unusual reaction of cipro so soon after taking it. Flagyl actually more likely to cause nerve issues but usually with long term treatment. I would recommend she try another dose of Cipro and if she tolerates it continue both. If she does not tolerate it we can try another regimen but not convinced one dose of cipro would cause a reaction like she is describing, that would be quite unusual. Thanks

## 2020-05-12 NOTE — Telephone Encounter (Signed)
Spoke with patient and discussed recommendations per Dr. Havery Moros. Patient will try another dose of Cipro if she can tolerate it and to continue both Flagyl and Cipro. Advised patient that if she does have the same reaction she will need to call us and let us know so that we could try another regimen for treatment. Patient verbalized understanding and had no further concerns at the end of the call.

## 2020-05-12 NOTE — Telephone Encounter (Signed)
Spoke with patient, she states that she took 1 Cipro last night and began to experience a tingling sensation in her left foot, she states that it felt like there was a vibrator in there, she reports that she has nerve damage in left leg and does not want to make that worse. Patient did stop the Cipro and is currently only taking Flagyl.  Please advise, thank you.

## 2020-05-20 ENCOUNTER — Ambulatory Visit: Payer: 59

## 2020-05-22 ENCOUNTER — Ambulatory Visit
Admission: RE | Admit: 2020-05-22 | Discharge: 2020-05-22 | Disposition: A | Discharge status: Home / Self Care | Payer: 59 | Source: Ambulatory Visit | Attending: Physician Assistant | Admitting: Physician Assistant

## 2020-05-22 ENCOUNTER — Other Ambulatory Visit: Payer: Self-pay

## 2020-05-22 DIAGNOSIS — Z1231 Encounter for screening mammogram for malignant neoplasm of breast: Secondary | ICD-10-CM

## 2020-05-22 IMAGING — MG MM DIGITAL SCREENING BILAT W/ TOMO AND CAD
6 of 10 series · 6 of 30 positions shown · non-contrast
Comparison: Previous exam(s).

CLINICAL DATA: Screening.

EXAM:
DIGITAL SCREENING BILATERAL MAMMOGRAM WITH TOMOSYNTHESIS AND CAD
TECHNIQUE: Bilateral screening digital craniocaudal and mediolateral oblique
mammograms were obtained. Bilateral screening digital breast
tomosynthesis was performed. The images were evaluated with
computer-aided detection.

[L MLO synth-2D]
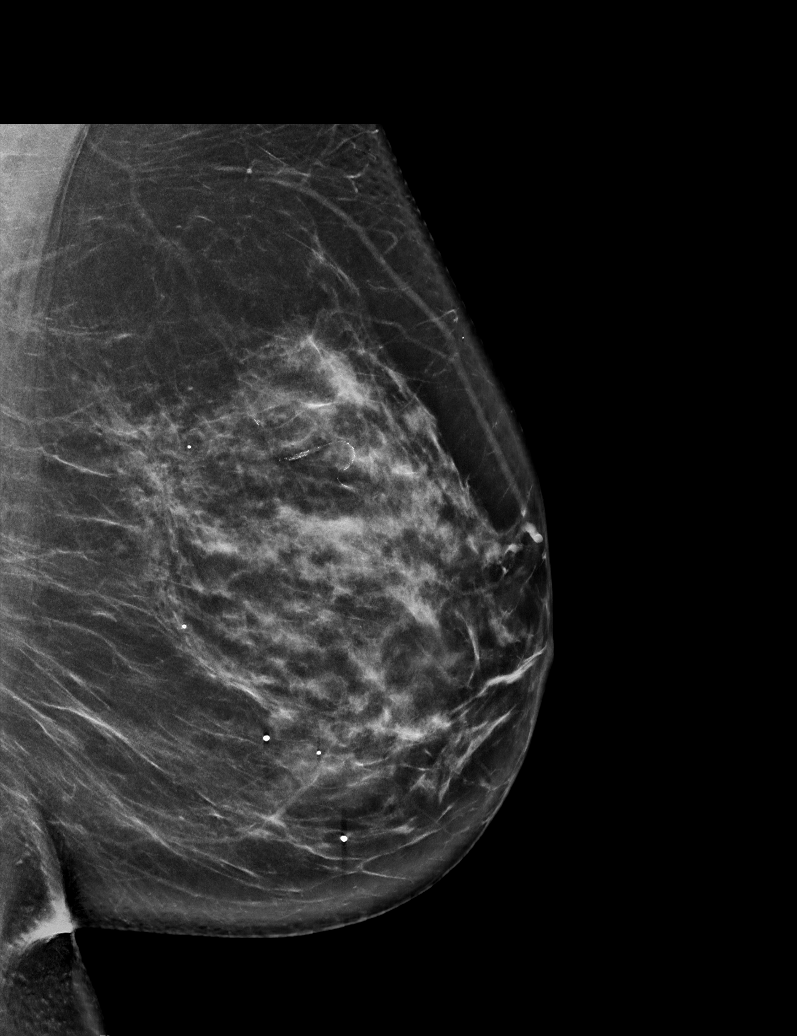

[R CC synth-2D]
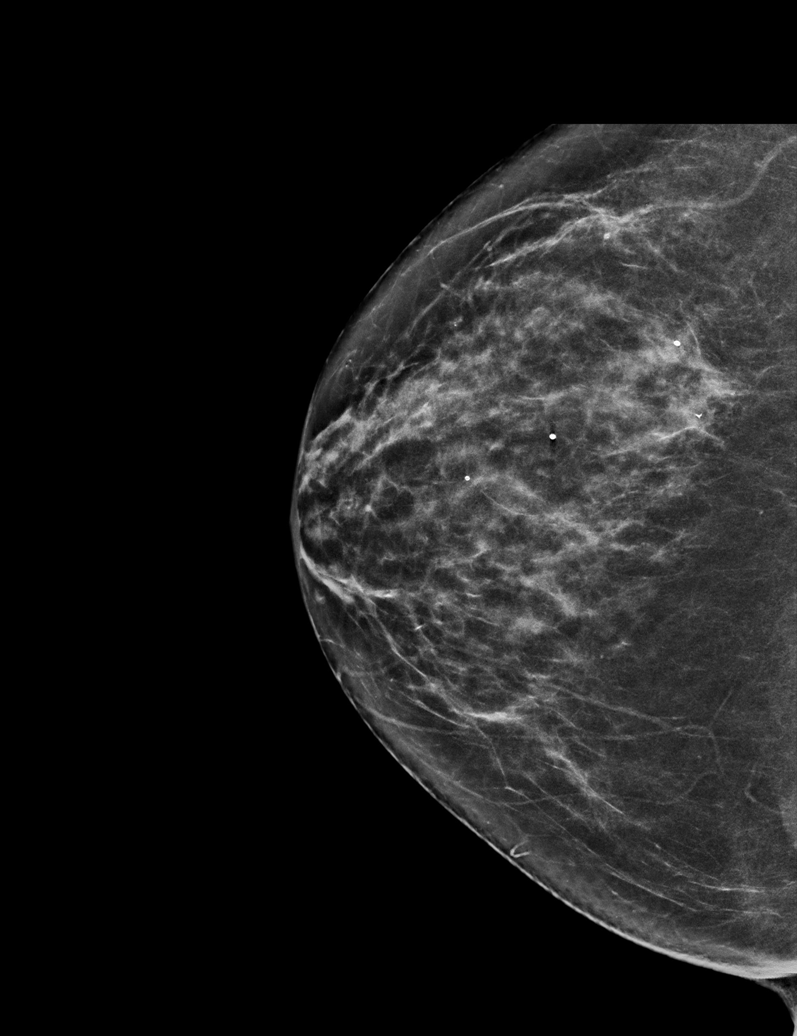

[L CC synth-2D]
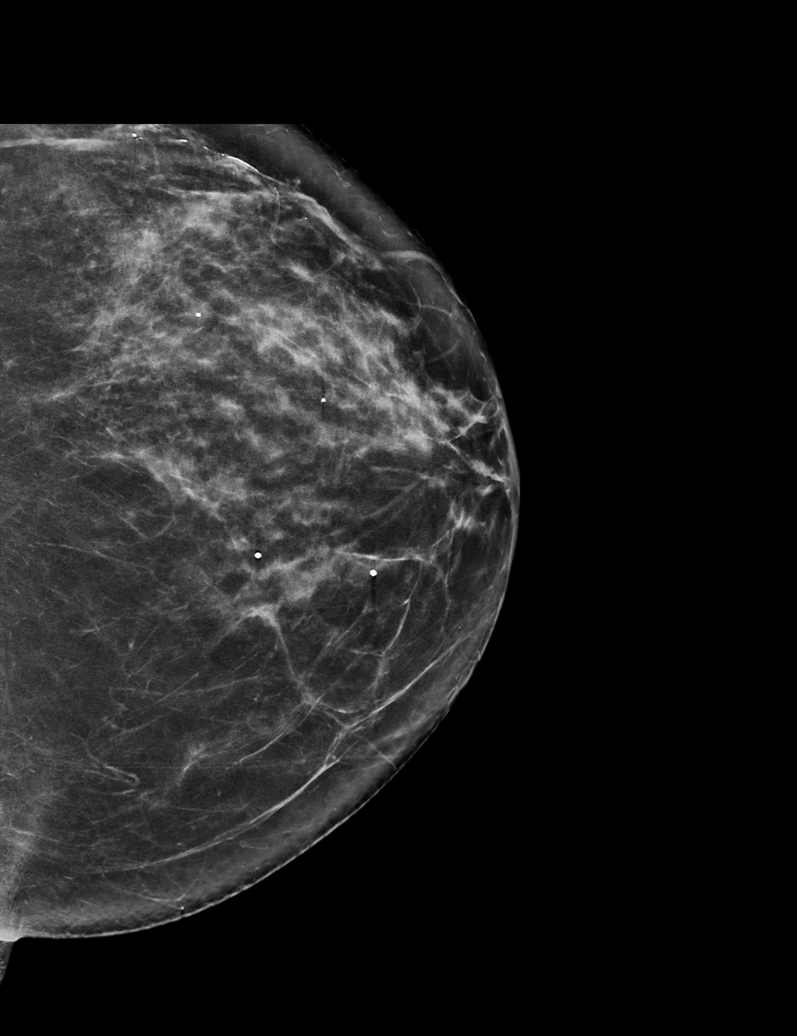

[R MLO synth-2D (1 of 2)]
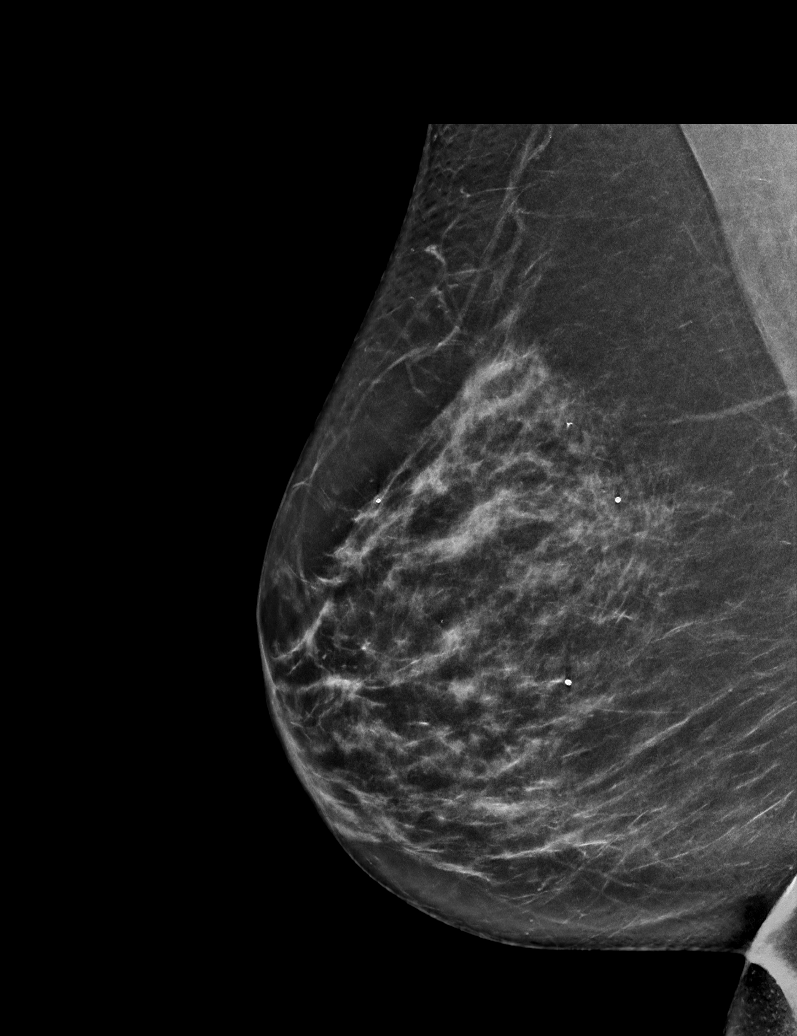

[R MLO synth-2D (2 of 2)]
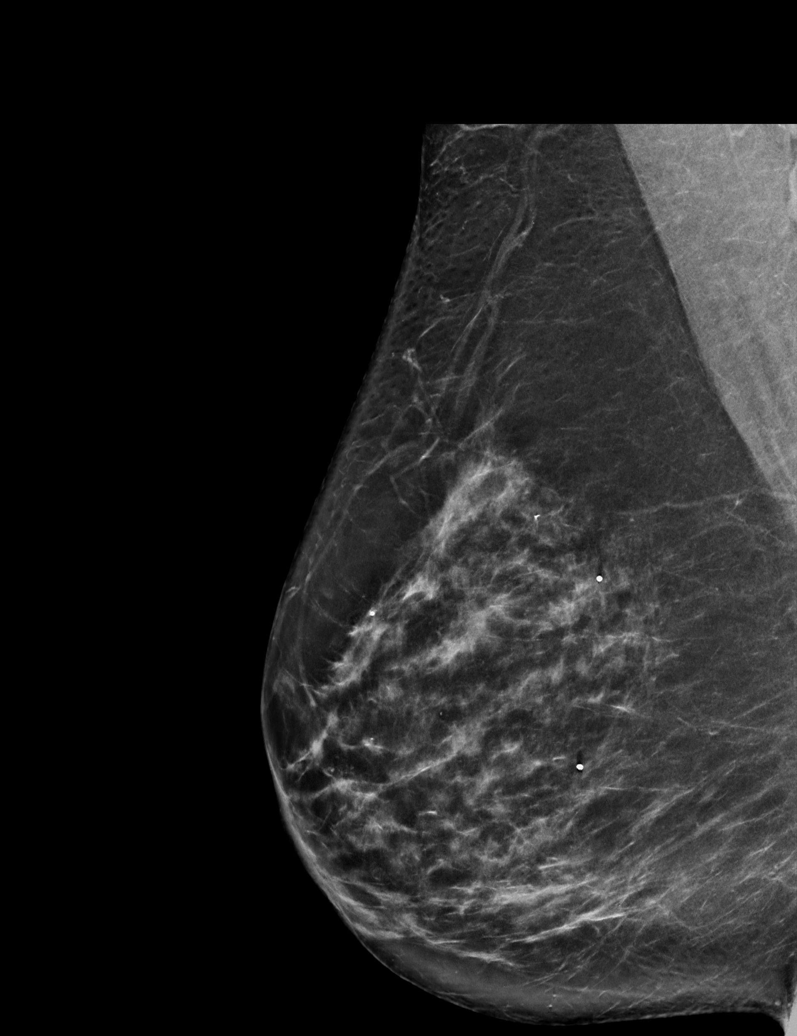

[R MLO tomo · tomo slice 41/80.0]
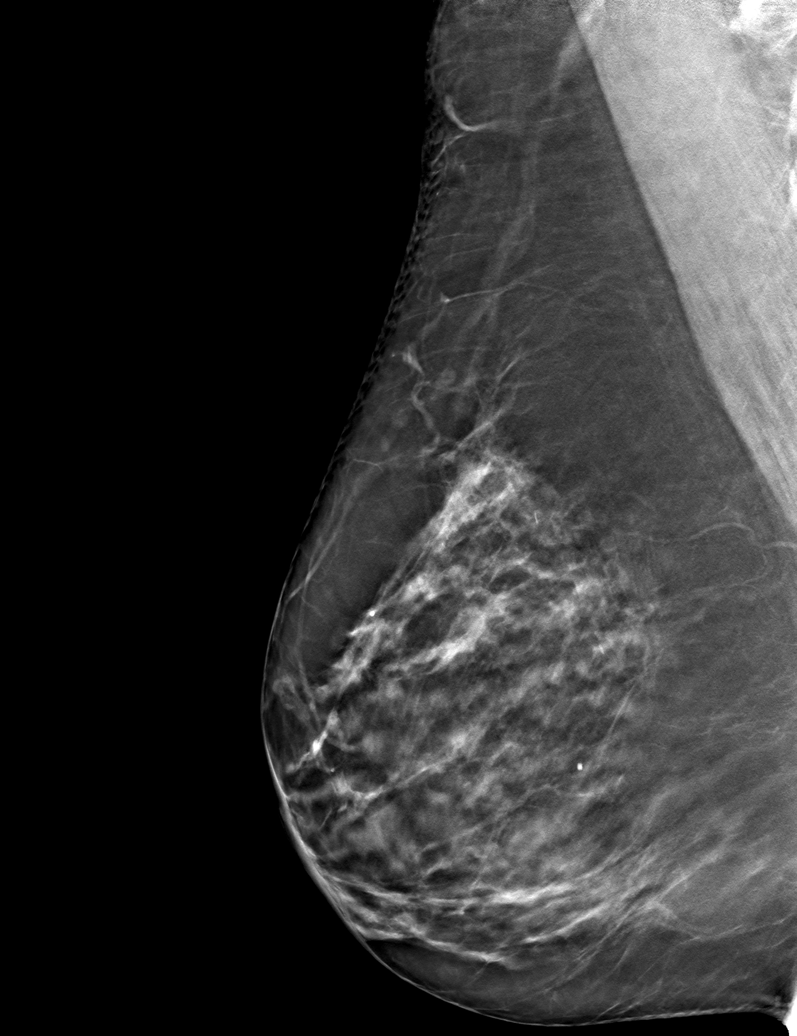

[6 of 30 positions shown; findings below may reference images not displayed]

ACR Breast Density Category b: There are scattered areas of
fibroglandular density.
FINDINGS: There are no findings suspicious for malignancy.
IMPRESSION: No mammographic evidence of malignancy. A result letter of this
screening mammogram will be mailed directly to the patient.

RECOMMENDATION:
Screening mammogram in one year. (Code:[BY])

BI-RADS CATEGORY  1: Negative.

## 2020-05-27 ENCOUNTER — Ambulatory Visit: Payer: 59 | Admitting: Diagnostic Neuroimaging

## 2020-05-27 ENCOUNTER — Encounter: Payer: Self-pay | Admitting: Diagnostic Neuroimaging

## 2020-05-27 VITALS — BP 126/78 | HR 68 | Ht 66.5 in | Wt 175.6 lb

## 2020-05-27 DIAGNOSIS — M5416 Radiculopathy, lumbar region: Secondary | ICD-10-CM

## 2020-05-27 NOTE — Patient Instructions (Signed)
  LOW BACK PAIN / LEFT LEG PAIN (lumbar radiculopathy) - continue PT exercises - continue gradually progressive walking / cycling  (5-10 minutes tolerated currently) - cancel EMG/NCS as it is not likely to change mgmt; left L5, S1 radiculopathy already suspected clinically and based on MRI results

## 2020-05-27 NOTE — Progress Notes (Signed)
[facility]  PATIENT: Samantha Mckinney DOB: 07-Nov-1955  REFERRING CLINICIAN: Susa Day, MD HISTORY FROM: patient  REASON FOR VISIT: new consult    HISTORICAL  CHIEF COMPLAINT:  Chief Complaint  Patient presents with  . Low Back Pain    Rm 7 New Pt "ruptured disk L 4-5 which causes pain in left leg, Left hip pain- muscle is ripped in half-not able to repair, muscles are torn, fell July 2021 but probably not related; EMG/NCS tomorrow "    HISTORY OF PRESENT ILLNESS:   65 year old female here for evaluation of low back pain rating to the left leg.  She has history of similar problem in 2000, treated with L4-5 laminectomy surgery with good improvement in symptoms.  Patient was doing well until about 2 years ago when she started to have gradual onset of low back pain rating to the left leg.  Symptoms worsened over time.  Patient has been very active throughout her life playing sports and continuing strengthening conditioning work.  She was running on a treadmill up until about 1 year ago.  After onset of pain she had to cut down running and activity.  She went to orthopedic clinic and had MRI of the lumbar spine which showed some degenerative changes, with some potential impingement on left L5 and left S1 nerve roots.  She was treated with epidural steroid injections with mild temporary relief.  She is pursued physical therapy evaluation and does these exercises on daily basis, which does help her symptoms.  She is trying to get back on treadmill for walking and her recumbent bicycle to help her conditioning.  She is able to do about 5 or 10 minutes at a time without pain but after she gets to 15 or 20 minutes of exercise, her legs feel very uncomfortable and painful.  Patient was referred here for consideration of EMG nerve conduction study and neurology consultation.  She is not that keen on pursuing additional testing or surgical treatments at this time.   REVIEW OF  SYSTEMS: Full 14 system review of systems performed and negative with exception of: As per HPI.  ALLERGIES: Allergies  Allergen Reactions  . Morphine And Related Swelling  . Other Other (See Comments)    Contrast dye allergy - N/V/D, swelling    HOME MEDICATIONS: Outpatient Medications Prior to Visit  Medication Sig Dispense Refill  . B Complex Vitamins (VITAMIN B COMPLEX PO) vitamin B complex    . BIOTIN 5000 PO Take by mouth.    . carvedilol (COREG) 6.25 MG tablet Take 3.125 mg by mouth 2 (two) times daily with a meal.    . Cholecalciferol (D3 PO) Take 4,000 mg by mouth daily.    . cyclobenzaprine (FLEXERIL) 10 MG tablet Take 10 mg by mouth 3 (three) times daily as needed for muscle spasms.    . magnesium oxide (MAG-OX) 400 MG tablet Take 400 mg by mouth daily.    . pantoprazole (PROTONIX) 40 MG tablet Take 40 mg by mouth daily.    . TURMERIC PO turmeric    . vitamin C (ASCORBIC ACID) 500 MG tablet Take 500 mg by mouth daily.    Marland Kitchen zinc gluconate 50 MG tablet Take 50 mg by mouth daily.    Marland Kitchen thiamine 100 MG tablet Take 100 mg by mouth daily.     No facility-administered medications prior to visit.    PAST MEDICAL HISTORY: Past Medical History:  Diagnosis Date  . Allergy   . Cataract   .  Colon polyps   . Elevated cholesterol   . Endometriosis 02/02/2017  . Factor V Leiden ([place]) 02/02/2017  . GERD (gastroesophageal reflux disease)   . IBS (irritable bowel syndrome)   . Liver hemangioma 02/02/2017  . Low back pain   . Lumbar radiculopathy   . NASH (nonalcoholic steatohepatitis) 02/02/2017  . Scoliosis   . Spinal stenosis of lumbar region     PAST SURGICAL HISTORY: Past Surgical History:  Procedure Laterality Date  . ABDOMINAL HYSTERECTOMY  1998   TOTAL, on estrogen  until 2012  . BACK SURGERY  2000   disc removed L4L5  . CHOLECYSTECTOMY    . COLONOSCOPY    . ESOPHAGOGASTRODUODENOSCOPY  2014   In [place]  . HEMORRHOID SURGERY  1996  . LAPAROSCOPIC ENDOMETRIOSIS  FULGURATION     x 6, 1980-1991  . UPPER GASTROINTESTINAL ENDOSCOPY      FAMILY HISTORY: Family History  Problem Relation Age of Onset  . Cirrhosis Mother   . Diabetes Mother   . Arthritis Mother   . Gout Mother   . Depression Mother   . Bowel Disease Mother   . Heart disease Father   . Diabetes Father   . Heart attack Father 96       during knee surgery  . Valvular heart disease Sister        had valve surgery  . Depression Sister   . Diabetes Brother   . Factor V Leiden deficiency Brother        with blood clots  . Hyperlipidemia Son   . Cancer Maternal Grandmother 43       colon cancer  . Colon cancer Maternal Grandmother   . Atrial fibrillation Brother   . Valvular heart disease Brother        valve surgery  . Hypertension Brother   . Esophageal cancer Neg Hx   . Rectal cancer Neg Hx   . Stomach cancer Neg Hx     SOCIAL HISTORY: Social History   Socioeconomic History  . Marital status: Married    Spouse name: Gershon Mussel  . Number of children: 1  . Years of education: Not on file  . Highest education level: High school graduate  Occupational History    Employer: [employer]    Comment: Environmental health practitioner  Tobacco Use  . Smoking status: Never Smoker  . Smokeless tobacco: Never Used  Vaping Use  . Vaping Use: Never used  Substance and Sexual Activity  . Alcohol use: Yes    Comment: OCC   . Drug use: Never  . Sexual activity: Not Currently    Partners: Male    Comment: 1st intercourse- 69, partners- 38, married- 67 yrs   Other Topics Concern  . Not on file  Social History Narrative   Lives with husband   Caffeine- coffee 1-2 a day   Not able to exercise 2/2 back pain   Social Determinants of Health   Financial Resource Strain: Not on file  Food Insecurity: Not on file  Transportation Needs: Not on file  Physical Activity: Not on file  Stress: Not on file  Social Connections: Not on file  Intimate Partner Violence: Not on file      PHYSICAL EXAM  GENERAL EXAM/CONSTITUTIONAL: Vitals:  Vitals:   05/27/20 1017  BP: 126/78  Pulse: 68  Weight: 175 lb 9.6 oz (79.7 kg)  Height: 5' 6.5" (1.689 m)     Body mass index is 27.92 kg/m. Wt Readings from Last  3 Encounters:  05/27/20 175 lb 9.6 oz (79.7 kg)  03/04/20 117 lb (53.1 kg)  01/07/20 177 lb 9.6 oz (80.6 kg)     Patient is in no distress; well developed, nourished and groomed; neck is supple  CARDIOVASCULAR:  Examination of carotid arteries is normal; no carotid bruits  Regular rate and rhythm, no murmurs  Examination of peripheral vascular system by observation and palpation is normal  EYES:  Ophthalmoscopic exam of optic discs and posterior segments is normal; no papilledema or hemorrhages  No exam data present  MUSCULOSKELETAL:  Gait, strength, tone, movements noted in Neurologic exam below  NEUROLOGIC: MENTAL STATUS:  No flowsheet data found.  awake, alert, oriented to person, place and time  recent and remote memory intact  normal attention and concentration  language fluent, comprehension intact, naming intact  fund of knowledge appropriate  CRANIAL NERVE:   2nd - no papilledema on fundoscopic exam  2nd, 3rd, 4th, 6th - pupils equal and reactive to light, visual fields full to confrontation, extraocular muscles intact, no nystagmus  5th - facial sensation symmetric  7th - facial strength symmetric  8th - hearing intact  9th - palate elevates symmetrically, uvula midline  11th - shoulder shrug symmetric  12th - tongue protrusion midline  MOTOR:   normal bulk and tone, full strength in the BUE, BLE  SENSORY:   normal and symmetric to light touch, pinprick, temperature, vibration  COORDINATION:   finger-nose-finger, fine finger movements normal  REFLEXES:   deep tendon reflexes TRACE and symmetric  GAIT/STATION:   narrow based gait; slight antalgic gait, limping on left leg     DIAGNOSTIC DATA  (LABS, IMAGING, TESTING) - I reviewed patient records, labs, notes, testing and imaging myself where available.  Lab Results  Component Value Date   WBC 5.5 01/02/2020   HGB 14.4 01/02/2020   HCT 42.5 01/02/2020   MCV 93.0 01/02/2020   PLT 255 01/02/2020      Component Value Date/Time   NA 141 01/02/2020 1301   K 4.4 01/02/2020 1301   CL 104 01/02/2020 1301   CO2 28 01/02/2020 1301   GLUCOSE 88 01/02/2020 1301   BUN 18 01/02/2020 1301   CREATININE 0.76 01/02/2020 1301   CALCIUM 9.8 01/02/2020 1301   PROT 6.8 01/02/2020 1301   ALBUMIN 4.2 07/26/2019 1134   AST 16 01/02/2020 1301   ALT 18 01/02/2020 1301   ALKPHOS 55 07/26/2019 1134   BILITOT 0.6 01/02/2020 1301   GFRNONAA 84 01/02/2020 1301   GFRAA 97 01/02/2020 1301   Lab Results  Component Value Date   CHOL 226 (H) 08/08/2019   HDL 49 (L) 08/08/2019   LDLCALC 147 (H) 08/08/2019   TRIG 161 (H) 08/08/2019   CHOLHDL 4.6 08/08/2019   Lab Results  Component Value Date   HGBA1C 5.6 08/08/2019   Lab Results  Component Value Date   VITAMINB12 493 08/08/2019   Lab Results  Component Value Date   TSH 1.34 01/02/2020    08/23/19 MRI lumbar spine -L5-S1 disc protrusion mildly impinging left S1 nerve root -L2-3 bilateral foraminal narrowing -L4-5 postsurgical changes with medial displacement of left L5 nerve root    ASSESSMENT AND PLAN  65 y.o. year old female here with:  Dx:  1. Left lumbar radiculopathy     PLAN:  LOW BACK PAIN / LEFT LEG PAIN (lumbar radiculopathy; since 2020; prior surgery in 2000) - continue PT exercises - continue gradually progressive walking / cycling  (5-10 minutes  tolerated currently) - cancel EMG/NCS as it is not likely to change mgmt; left L5, S1 radiculopathy already suspected clinically and based on MRI results  Return for return to PCP.    Penni Bombard, MD 11/17/5001, 70:48 AM Certified in Neurology, Neurophysiology and Neuroimaging  [facility] [address] [place], [place] [address] [phone]

## 2020-05-28 ENCOUNTER — Encounter: Payer: 59 | Admitting: Diagnostic Neuroimaging

## 2020-06-08 ENCOUNTER — Telehealth: Payer: Self-pay | Admitting: Cardiology

## 2020-06-08 NOTE — Telephone Encounter (Signed)
*  STAT* If patient is at the pharmacy, call can be transferred to refill team.   1. Which medications need to be refilled? (please list name of each medication and dose if known)  carvedilol (COREG) 6.25 MG tablet  2. Which pharmacy/location (including street and city if local pharmacy) is medication to be sent to? [facility] at [place], [place]  3. Do they need a 30 day or 90 day supply? 90 day supply

## 2020-06-09 MED ORDER — CARVEDILOL 6.25 MG PO TABS: 3.1250 mg | ORAL_TABLET | Freq: Two times a day (BID) | ORAL | 3 refills | Status: DC

## 2020-07-01 ENCOUNTER — Ambulatory Visit: Payer: 59 | Admitting: Nurse Practitioner

## 2020-07-01 ENCOUNTER — Encounter: Payer: Self-pay | Admitting: Nurse Practitioner

## 2020-07-01 VITALS — BP 120/88 | HR 82 | Ht 66.0 in | Wt 178.6 lb

## 2020-07-01 DIAGNOSIS — R1030 Lower abdominal pain, unspecified: Secondary | ICD-10-CM | POA: Diagnosis not present

## 2020-07-01 DIAGNOSIS — K5900 Constipation, unspecified: Secondary | ICD-10-CM

## 2020-07-01 NOTE — Progress Notes (Signed)
Agree with assessment and plan as outlined.  She had a CT scan episode of diverticulitis within a year and a half of her last colonoscopy which was a good prep and no pathology in her left colon other than diverticulosis.  I do not feel strongly that she needs another colonoscopy right now however should she continue to have episodes we may consider it.  She should let us know if she has recurrence of symptoms.  Thanks

## 2020-07-01 NOTE — Patient Instructions (Addendum)
It was a pleasure to see you today. Based on our discussion, I am providing you with my recommendations below:  RECOMMENDATION(S):   . Miralax 1 capful in 8 ounces of liquid nightly.  . Call with any recurrent abdominal pain.   BMI:  . If you are age 63 or older, your body mass index should be between 23-30. Your Body mass index is 28.83 kg/m. If this is out of the aforementioned range listed, please consider follow up with your Primary Care Provider.  . If you are age 21 or younger, your body mass index should be between 19-25. Your Body mass index is 28.83 kg/m. If this is out of the aformentioned range listed, please consider follow up with your Primary Care Provider.   Thank you for trusting me with your gastrointestinal care!    Rubbie Battiest, NP

## 2020-07-01 NOTE — Progress Notes (Signed)
ASSESSMENT AND PLAN    # 65 yo female with history of sigmoid / descending colon diverticulitis documented by CT scan in April 2021. Recurrent diverticulitis symptoms and fever in late January 2022. We called in antibiotics, symptoms resolved. She had recurrent,  but much milder symptoms two weeks later. Doubt diverticulitis that time since pain resolved within a day with heating pad and a bowel movement. Suspect most recent episode of pain was secondary to constipation ( she had stopped her miralax).  --Keep bowels moving with Miralax.  --She inquired about colonoscopy. We reviewed findings from her last colonoscopy in 2019. She had 2 diminutive adenomas. A 5 years interval surveillance colonoscopy was recommended.  Based on new polyp surveillance guidelines, she could probably extend interval colonoscopy out to 7 years. However, patient has some anxiety around development of colon cancer so I think we should stay with the original plan.  --Call for any recurrent diverticulitis symptoms.    # Chronic constipation.  --Continue Mirialax QHS and good hydration.   HISTORY OF PRESENT ILLNESS     Primary Gastroenterologist : [place] Cellar, MD  Chief Complaint : follow up on diverticulitis.   Samantha Mckinney is a 65 y.o. female with past medical history of chronic constipation, diverticulitis, adenomatous colon polyps, Factor V Leiden mutation, NAFLD, hepatic hemangiomas  Patient has a history of documented diverticulitis April 2021.  Symptoms resolved with antibiotics. In late January 2022 she had diverticulitis symptoms with left lower quadrant pain radiating across lower abdomen and fever. We called in antibiotics and symptoms resolved. She had recurrent lower abdominal pain, much less severe in mid February. She used a heating pad, had BM the next day and pain resolved. She had no associated fever that time. No urinary symptoms.   Patient has a hx of adenomatous colon polyps. Two 2 small  adenomatous polyps < 10 mm on last colonoscopy in 2019  Patient complains of hair loss and joint pain since late January . She is awaiting appointment to see a Rheumatologist.   Past Medical History:  Diagnosis Date   Allergy    Cataract    Colon polyps    Elevated cholesterol    Endometriosis 02/02/2017   Factor V Leiden ([place]) 02/02/2017   GERD (gastroesophageal reflux disease)    IBS (irritable bowel syndrome)    Liver hemangioma 02/02/2017   Low back pain    Lumbar radiculopathy    NASH (nonalcoholic steatohepatitis) 02/02/2017   Scoliosis    Spinal stenosis of lumbar region     Current Medications, Allergies, Past Surgical History, Family History and Social History were reviewed in [employer] record.   Current Outpatient Medications  Medication Sig Dispense Refill   B Complex Vitamins (VITAMIN B COMPLEX PO) vitamin B complex     BIOTIN 5000 PO Take by mouth.     carvedilol (COREG) 6.25 MG tablet Take 0.5 tablets (3.125 mg total) by mouth 2 (two) times daily with a meal. 90 tablet 3   Cholecalciferol (D3 PO) Take 4,000 mg by mouth daily.     cyclobenzaprine (FLEXERIL) 10 MG tablet Take 10 mg by mouth 3 (three) times daily as needed for muscle spasms.     magnesium oxide (MAG-OX) 400 MG tablet Take 400 mg by mouth daily.     pantoprazole (PROTONIX) 40 MG tablet Take 40 mg by mouth daily.     TURMERIC PO turmeric     vitamin C (ASCORBIC ACID) 500 MG tablet Take 500  mg by mouth daily.     zinc gluconate 50 MG tablet Take 50 mg by mouth daily.     No current facility-administered medications for this visit.    Review of Systems: No chest pain. No shortness of breath. No urinary complaints.   PHYSICAL EXAM :    Wt Readings from Last 3 Encounters:  07/01/20 178 lb 9.6 oz (81 kg)  05/27/20 175 lb 9.6 oz (79.7 kg)  03/04/20 117 lb (53.1 kg)    Ht 5' 6"  (1.676 m)    Wt 178 lb 9.6 oz (81 kg)    BMI 28.83 kg/m   Constitutional:  Pleasant fe*female in no acute distress. Psychiatric: Normal mood and affect. Behavior is normal. EENT: Pupils normal.  Conjunctivae are normal. No scleral icterus. Neck supple.  Cardiovascular: Normal rate, regular rhythm. No edema Pulmonary/chest: Effort normal and breath sounds normal. No wheezing, rales or rhonchi. Abdominal: Soft, nondistended, nontender. Bowel sounds active throughout. There are no masses palpable. No hepatomegaly. Neurological: Alert and oriented to person place and time. Skin: Skin is warm and dry. No rashes noted.  I spent 30 minutes total reviewing records, obtaining history, performing exam, counseling patient and documenting visit / findings.    Tye Savoy, NP  07/01/2020, 10:01 AM

## 2020-08-10 ENCOUNTER — Encounter: Payer: 59 | Admitting: Adult Health

## 2020-11-10 ENCOUNTER — Other Ambulatory Visit: Payer: Self-pay

## 2020-11-10 ENCOUNTER — Telehealth: Payer: Self-pay | Admitting: Nurse Practitioner

## 2020-11-10 MED ORDER — METRONIDAZOLE 500 MG PO TABS: 500.0000 mg | ORAL_TABLET | Freq: Two times a day (BID) | ORAL | 0 refills | Status: AC

## 2020-11-10 MED ORDER — CIPROFLOXACIN HCL 500 MG PO TABS: 500.0000 mg | ORAL_TABLET | Freq: Two times a day (BID) | ORAL | 0 refills | Status: AC

## 2020-11-10 NOTE — Telephone Encounter (Signed)
Patient with a history of diverticulitis, last bout was about 12 months ago. She states "this feels exactly like it did last time." She began having diarrhea last week (end of the week) and then cramping with no bowel movement over the weekend. Last night she developed a fever of 100.4. Today her temp is 100.9. She is passing "slimy mucous stool" and feels constipated. She has LLQ pain. She is on a clear liquid diet. Allergic to morphine. Please advise.

## 2020-11-10 NOTE — Telephone Encounter (Signed)
Okay, sorry to hear this. She has had a history of this and if symptoms most c/w her prior diverticulitis attacks we can empirically treat her with flagyl 531m TID and cipro 5051mBID for one week, hopefully this helps. If worsening or fails to respond she should call [facility]. Thanks

## 2020-11-10 NOTE — Telephone Encounter (Signed)
Patient instructed. She thanks me for the call.

## 2020-11-10 NOTE — Telephone Encounter (Signed)
Pt called stating that she is having a diverticulitis flare up. She is running a 100.9 fever and has been having diarrhea, cramping, and abd pain. She would like an antibiotic prescribed. She uses [facility] [address] at [place].

## 2020-11-24 ENCOUNTER — Other Ambulatory Visit: Payer: Self-pay | Admitting: Specialist

## 2020-11-24 DIAGNOSIS — M25552 Pain in left hip: Secondary | ICD-10-CM

## 2020-11-28 ENCOUNTER — Emergency Department (HOSPITAL_BASED_OUTPATIENT_CLINIC_OR_DEPARTMENT_OTHER): Payer: No Typology Code available for payment source

## 2020-11-28 ENCOUNTER — Emergency Department (HOSPITAL_BASED_OUTPATIENT_CLINIC_OR_DEPARTMENT_OTHER)
Admission: EM | Admit: 2020-11-28 | Discharge: 2020-11-28 | Disposition: A | Discharge status: Home / Self Care | Payer: No Typology Code available for payment source | Attending: Emergency Medicine | Admitting: Emergency Medicine

## 2020-11-28 ENCOUNTER — Encounter (HOSPITAL_BASED_OUTPATIENT_CLINIC_OR_DEPARTMENT_OTHER): Payer: Self-pay

## 2020-11-28 ENCOUNTER — Other Ambulatory Visit: Payer: Self-pay

## 2020-11-28 DIAGNOSIS — N189 Chronic kidney disease, unspecified: Secondary | ICD-10-CM | POA: Insufficient documentation

## 2020-11-28 DIAGNOSIS — M7122 Synovial cyst of popliteal space [Baker], left knee: Secondary | ICD-10-CM

## 2020-11-28 DIAGNOSIS — M79605 Pain in left leg: Secondary | ICD-10-CM | POA: Diagnosis present

## 2020-11-28 LAB — CBC WITH DIFFERENTIAL/PLATELET
Abs Immature Granulocytes: 0.01 10*3/uL (ref 0.00–0.07)
Basophils Absolute: 0 10*3/uL (ref 0.0–0.1)
Basophils Relative: 0 %
Eosinophils Absolute: 0.1 10*3/uL (ref 0.0–0.5)
Eosinophils Relative: 1 %
HCT: 41.4 % (ref 36.0–46.0)
Hemoglobin: 13.7 g/dL (ref 12.0–15.0)
Immature Granulocytes: 0 %
Lymphocytes Relative: 36 %
Lymphs Abs: 2.5 10*3/uL (ref 0.7–4.0)
MCH: 30.2 pg (ref 26.0–34.0)
MCHC: 33.1 g/dL (ref 30.0–36.0)
MCV: 91.4 fL (ref 80.0–100.0)
Monocytes Absolute: 0.7 10*3/uL (ref 0.1–1.0)
Monocytes Relative: 9 %
Neutro Abs: 3.8 10*3/uL (ref 1.7–7.7)
Neutrophils Relative %: 54 %
Platelets: 244 10*3/uL (ref 150–400)
RBC: 4.53 MIL/uL (ref 3.87–5.11)
RDW: 12.4 % (ref 11.5–15.5)
WBC: 7 10*3/uL (ref 4.0–10.5)
nRBC: 0 % (ref 0.0–0.2)

## 2020-11-28 LAB — BASIC METABOLIC PANEL
Anion gap: 9 (ref 5–15)
BUN: 22 mg/dL (ref 8–23)
CO2: 26 mmol/L (ref 22–32)
Calcium: 9.5 mg/dL (ref 8.9–10.3)
Chloride: 105 mmol/L (ref 98–111)
Creatinine, Ser: 0.66 mg/dL (ref 0.44–1.00)
GFR, Estimated: >60 mL/min (ref >60)
Glucose, Bld: 98 mg/dL (ref 70–99)
Potassium: 3.9 mmol/L (ref 3.5–5.1)
Sodium: 140 mmol/L (ref 135–145)

## 2020-11-28 LAB — D-DIMER, QUANTITATIVE: D-Dimer, Quant: 0.68 ug/mL-FEU — ABNORMAL HIGH (ref 0.00–0.50)

## 2020-11-28 IMAGING — US US EXTREM LOW VENOUS*L*
1 series · 13 of 24 positions shown · non-contrast
Comparison: None.

CLINICAL DATA: 65-year-old female with left lower extremity calf
pain



[Series 1: us venous img lower uni left (dvt) · portal-venous · 74 acquisitions, 13 frames shown]
[im 1/74]
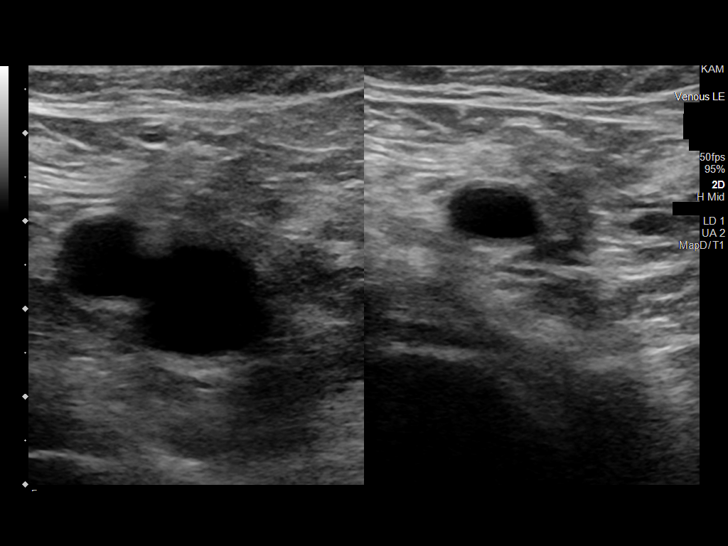
[im 7/74]
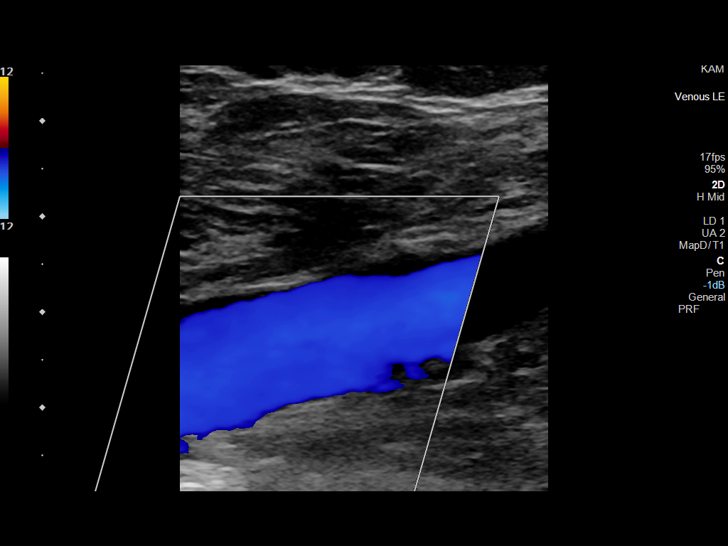
[im 13/74]
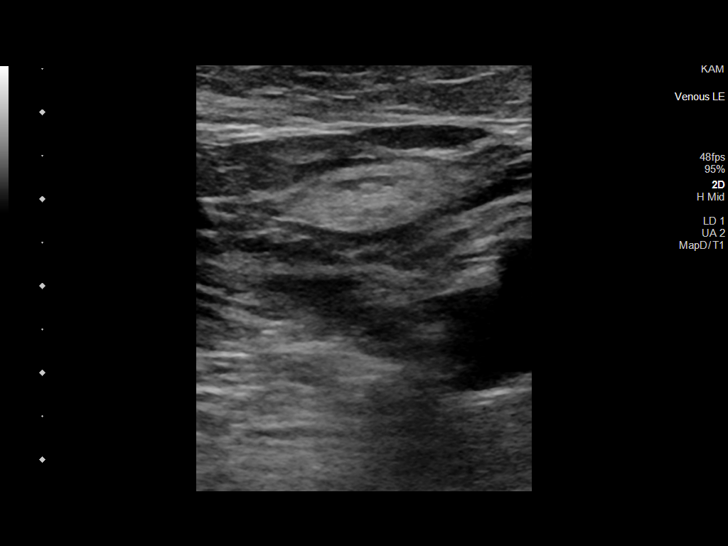
[im 20/74]
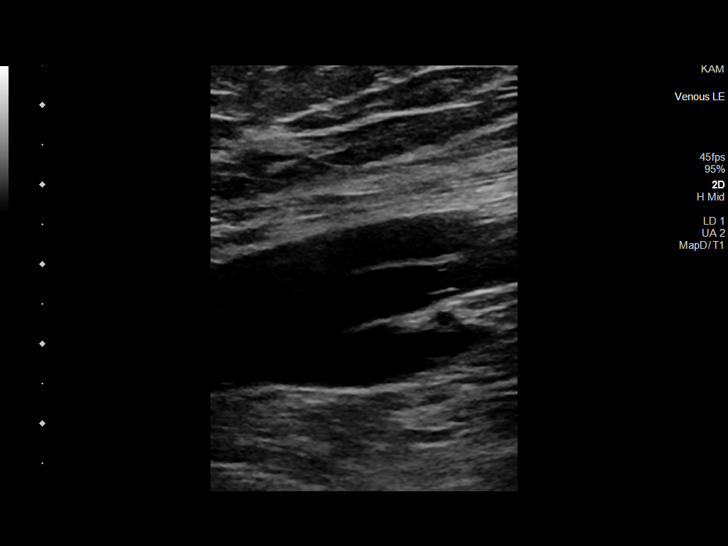
[im 26/74]
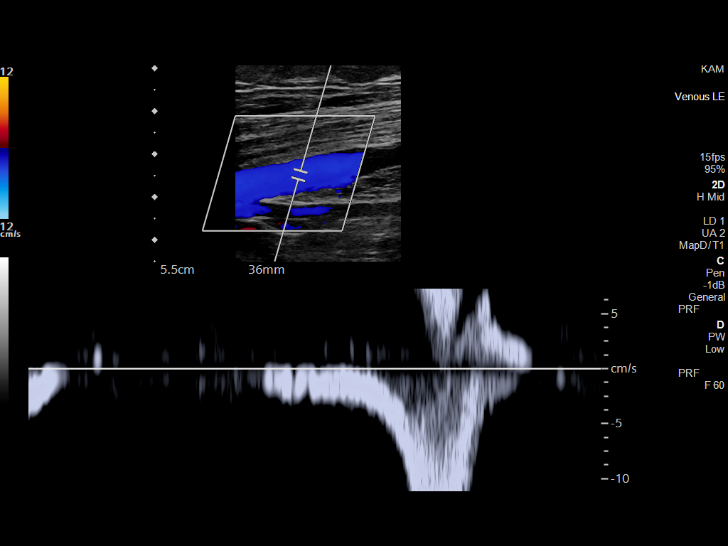
[im 32/74]
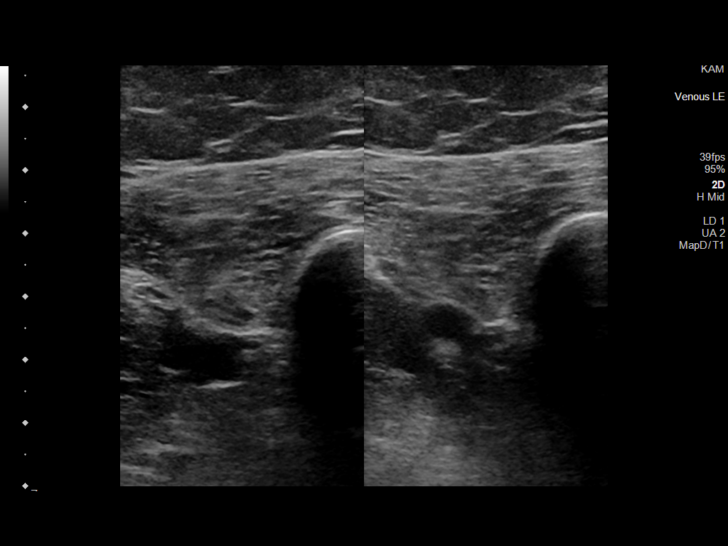
[im 42/74]
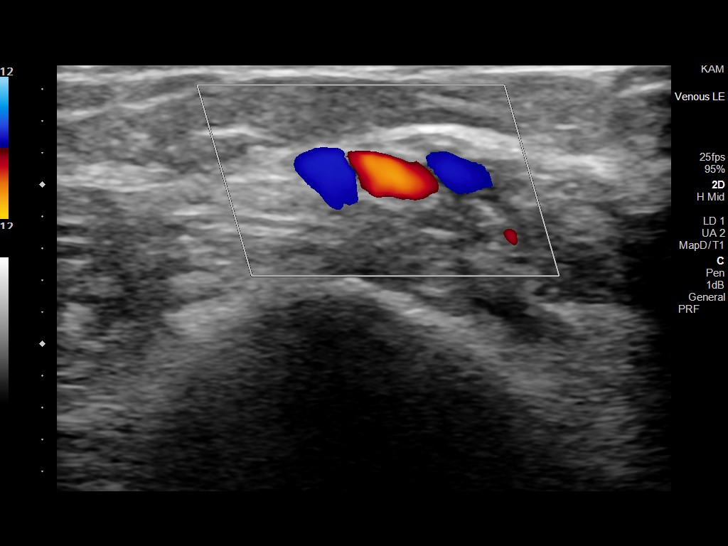
[im 45/74]
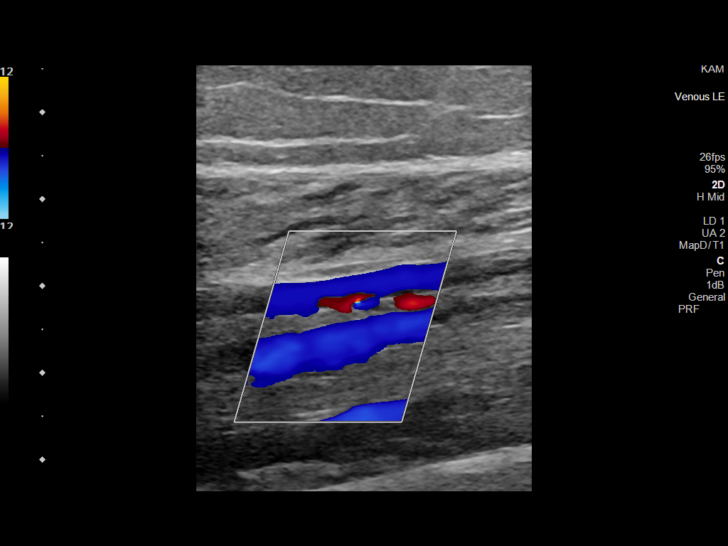
[im 51/74]
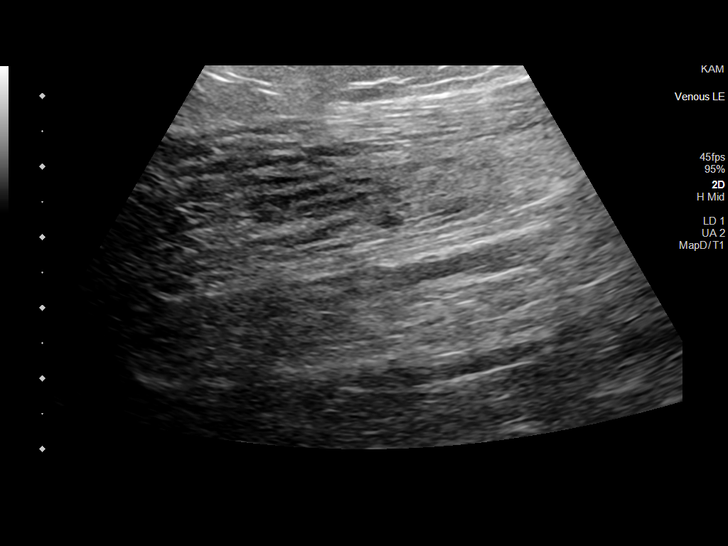
[im 58/74]
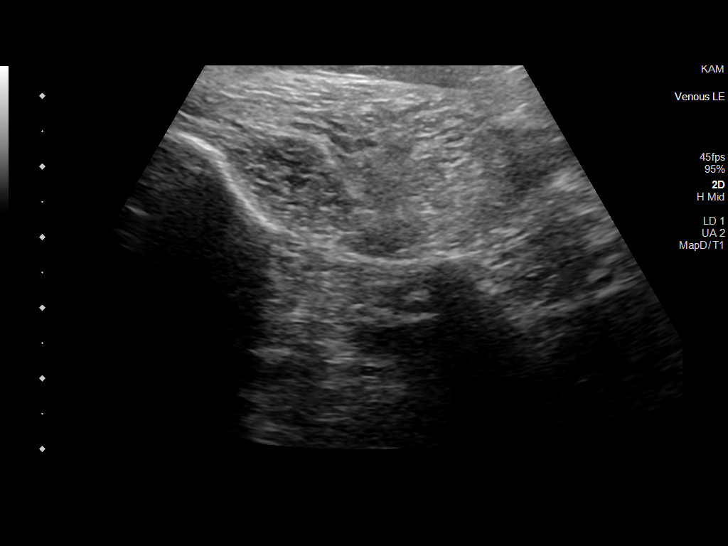
[im 64/74]
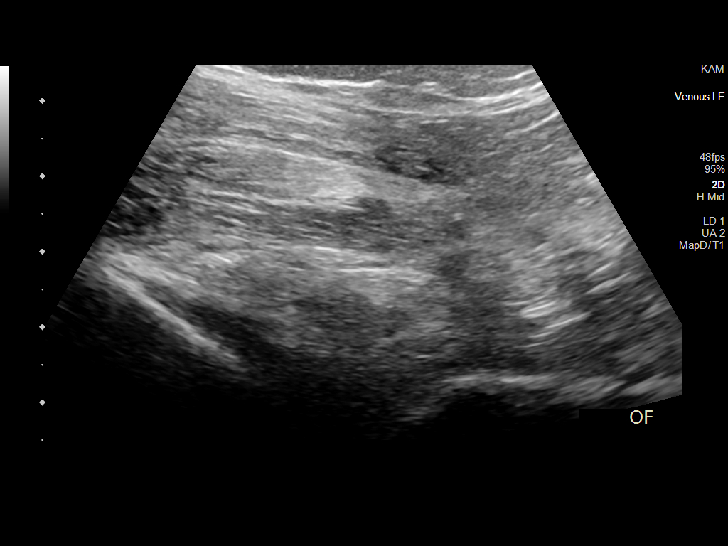
[im 70/74]
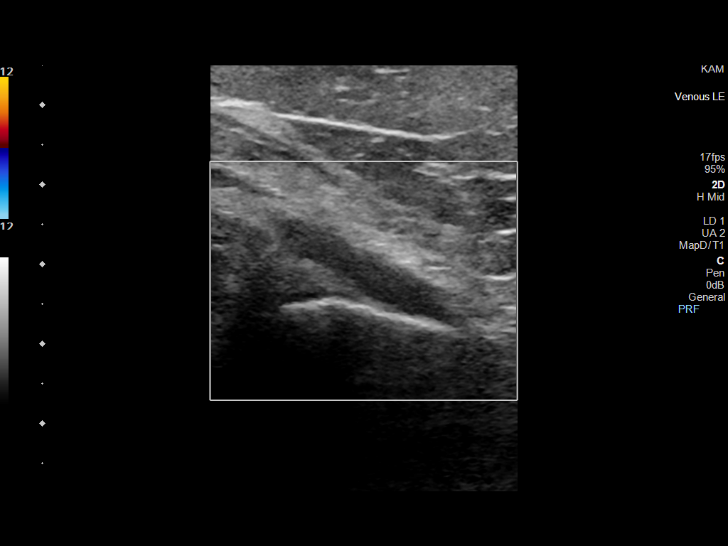
[im 74/74]
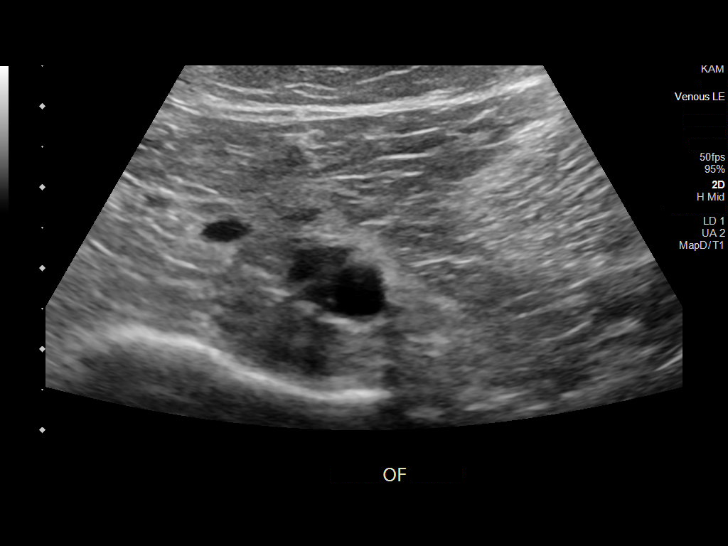

[13 of 24 positions shown; findings below may reference images not displayed]

FINDINGS: Contralateral Common Femoral Vein: Respiratory phasicity is normal
and symmetric with the symptomatic side. No evidence of thrombus.
Normal compressibility.

Common Femoral Vein: No evidence of thrombus. Normal
compressibility, respiratory phasicity and response to augmentation.

Saphenofemoral Junction: No evidence of thrombus. Normal
compressibility and flow on color Doppler imaging.

Profunda Femoral Vein: No evidence of thrombus. Normal
compressibility and flow on color Doppler imaging.

Femoral Vein: No evidence of thrombus. Normal compressibility,
respiratory phasicity and response to augmentation.

Popliteal Vein: No evidence of thrombus. Normal compressibility,
respiratory phasicity and response to augmentation.

Calf Veins: No evidence of thrombus. Normal compressibility and flow
on color Doppler imaging.

Superficial Great Saphenous Vein: No evidence of thrombus. Normal
compressibility and flow on color Doppler imaging.

Other Findings: Lentiform anechoic fluid in the medial popliteal
fossa, 1.7 cm x 1.8 cm x 0.7 cm.
IMPRESSION: Sonographic survey of the left lower extremity negative for DVT

Lentiform anechoic fluid in the medial popliteal fossa, potentially
a Baker's cyst, para meniscal cyst, hematoma/seroma. Abscess is
unlikely.

## 2020-11-28 NOTE — ED Notes (Signed)
MD at bedside. 

## 2020-11-28 NOTE — Discharge Instructions (Addendum)
The ultrasound reveals that you have a Baker's cyst.  No signs of blood clot in your leg.  Please read instructions provided.  Treatment is going to be over-the-counter ibuprofen and Tylenol for pain control along with ice and heat compresses.

## 2020-11-28 NOTE — ED Provider Notes (Signed)
[facility] DEPT Provider Note   CSN: [id number] Arrival date & time: 11/28/20  1238     History Chief Complaint  Patient presents with   Leg Pain    Samantha Mckinney is a 65 y.o. female.  HPI     66 year old female with history of factor V Leyden, family history of clots comes in with chief complaint of leg pain.  She reports that she has noticed some pain in her left calf over the last few days.  She is also noted some skin discoloration over the left lower extremity.  There is some pain with ambulation.  She was seen by [facility] and was advised to come to the ER for DVT evaluation.  Although currently she does not have any shortness of breath or chest pain, she does indicate that few days ago she had an episode of chest pain that she could not explain.  Patient denies any smoking.  Past Medical History:  Diagnosis Date   Allergy    Cataract    Colon polyps    Elevated cholesterol    Endometriosis 02/02/2017   Factor V Leiden ([place]) 02/02/2017   GERD (gastroesophageal reflux disease)    IBS (irritable bowel syndrome)    Liver hemangioma 02/02/2017   Low back pain    Lumbar radiculopathy    NASH (nonalcoholic steatohepatitis) 02/02/2017   Scoliosis    Spinal stenosis of lumbar region     Patient Active Problem List   Diagnosis Date Noted   Paroxysmal tachycardia ([place]) 01/07/2020   Low back pain 07/16/2019   Lung nodule 02/02/2017   Factor V Leiden ([place]) 02/02/2017   Mixed hyperlipidemia 02/02/2017   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   Vitamin D deficiency 02/02/2017   Endometriosis 02/02/2017   Abnormal liver CT 02/02/2017   Hiatal hernia 02/02/2017   CKD (chronic kidney disease) 02/02/2017   NASH (nonalcoholic steatohepatitis) 02/02/2017    Past Surgical History:  Procedure Laterality Date   ABDOMINAL HYSTERECTOMY  1998   TOTAL, on estrogen  until 2012   BACK SURGERY  2000   disc removed L4L5   CHOLECYSTECTOMY     COLONOSCOPY      ESOPHAGOGASTRODUODENOSCOPY  2014   In [place]   HEMORRHOID SURGERY  1996   LAPAROSCOPIC ENDOMETRIOSIS FULGURATION     x 6, 1980-1991   UPPER GASTROINTESTINAL ENDOSCOPY       OB History     Gravida  1   Para  1   Term      Preterm      AB      Living  1      SAB      IAB      Ectopic      Multiple      Live Births              Family History  Problem Relation Age of Onset   Cirrhosis Mother    Diabetes Mother    Arthritis Mother    Gout Mother    Depression Mother    Bowel Disease Mother    Heart disease Father    Diabetes Father    Heart attack Father 40       during knee surgery   Valvular heart disease Sister        had valve surgery   Depression Sister    Diabetes Brother    Factor V Leiden deficiency Brother        with blood clots  Hyperlipidemia Son    Cancer Maternal Grandmother 12       colon cancer   Colon cancer Maternal Grandmother    Atrial fibrillation Brother    Valvular heart disease Brother        valve surgery   Hypertension Brother    Esophageal cancer Neg Hx    Rectal cancer Neg Hx    Stomach cancer Neg Hx     Social History   Tobacco Use   Smoking status: Never   Smokeless tobacco: Never  Vaping Use   Vaping Use: Never used  Substance Use Topics   Alcohol use: Yes    Comment: OCC    Drug use: Never    Home Medications Prior to Admission medications   Medication Sig Start Date End Date Taking? Authorizing Provider  B Complex Vitamins (VITAMIN B COMPLEX PO) vitamin B complex   Yes [provider]  BIOTIN 5000 PO Take by mouth.   Yes [provider]  carvedilol (COREG) 6.25 MG tablet Take 0.5 tablets (3.125 mg total) by mouth 2 (two) times daily with a meal. 06/09/20  Yes Leonie Man, MD  Cholecalciferol (D3 PO) Take 4,000 mg by mouth daily.   Yes [provider]  cyclobenzaprine (FLEXERIL) 10 MG tablet Take 10 mg by mouth 3 (three) times daily as needed for muscle spasms.   Yes  [provider]  magnesium oxide (MAG-OX) 400 MG tablet Take 400 mg by mouth daily.   Yes [provider]  pantoprazole (PROTONIX) 40 MG tablet Take 40 mg by mouth daily.   Yes [provider]  TURMERIC PO turmeric   Yes [provider]  vitamin C (ASCORBIC ACID) 500 MG tablet Take 500 mg by mouth daily.   Yes [provider]  zinc gluconate 50 MG tablet Take 50 mg by mouth daily.   Yes [provider]    Allergies    Morphine and related and Other  Review of Systems   Review of Systems  Constitutional:  Positive for activity change.  Respiratory:  Negative for shortness of breath.   Cardiovascular:  Negative for chest pain.  Musculoskeletal:  Positive for myalgias.  Skin:  Positive for color change.  All other systems reviewed and are negative.  Physical Exam Updated Vital Signs BP (!) 153/91 (BP Location: Right Arm)   Pulse 66   Temp 97.9 F (36.6 C) (Oral)   Resp 16   Ht 5' 6"  (1.676 m)   Wt 76.2 kg   SpO2 98%   BMI 27.12 kg/m   Physical Exam Vitals and nursing note reviewed.  Constitutional:      Appearance: She is well-developed.  HENT:     Head: Atraumatic.  Cardiovascular:     Rate and Rhythm: Normal rate.  Pulmonary:     Effort: Pulmonary effort is normal.  Musculoskeletal:        General: Swelling and tenderness present.     Cervical back: Normal range of motion and neck supple.     Left lower leg: Edema present.     Comments: Mild nodular lesion over the popliteal fossa of the left knee, tender to palpation  Skin:    General: Skin is warm and dry.  Neurological:     Mental Status: She is alert and oriented to person, place, and time.    ED Results / Procedures / Treatments   Labs (all labs ordered are listed, but only abnormal results are displayed) Labs Reviewed  D-DIMER, QUANTITATIVE - Abnormal; Notable for the following components:      Result Value   D-Dimer, Quant 0.68 (*)    All other  components within normal limits  CBC WITH DIFFERENTIAL/PLATELET  BASIC METABOLIC PANEL    EKG EKG Interpretation  Date/Time:  Saturday November 28 2020 14:15:30 EDT Ventricular Rate:  60 PR Interval:  123 QRS Duration: 78 QT Interval:  419 QTC Calculation: 419 R Axis:   45 Text Interpretation: Sinus rhythm Low voltage, precordial leads No acute changes No old tracing to compare Confirmed by Varney Biles [phone]) on 11/28/2020 2:20:23 PM  Radiology US Venous Img Lower Unilateral Left  Result Date: 11/28/2020 CLINICAL DATA:  65 year old female with left lower extremity calf pain EXAM: LEFT LOWER EXTREMITY VENOUS DOPPLER ULTRASOUND TECHNIQUE: Gray-scale sonography with graded compression, as well as color Doppler and duplex ultrasound were performed to evaluate the lower extremity deep venous systems from the level of the common femoral vein and including the common femoral, femoral, profunda femoral, popliteal and calf veins including the posterior tibial, peroneal and gastrocnemius veins when visible. The superficial great saphenous vein was also interrogated. Spectral Doppler was utilized to evaluate flow at rest and with distal augmentation maneuvers in the common femoral, femoral and popliteal veins. COMPARISON:  None. FINDINGS: Contralateral Common Femoral Vein: Respiratory phasicity is normal and symmetric with the symptomatic side. No evidence of thrombus. Normal compressibility. Common Femoral Vein: No evidence of thrombus. Normal compressibility, respiratory phasicity and response to augmentation. Saphenofemoral Junction: No evidence of thrombus. Normal compressibility and flow on color Doppler imaging. Profunda Femoral Vein: No evidence of thrombus. Normal compressibility and flow on color Doppler imaging. Femoral Vein: No evidence of thrombus. Normal compressibility, respiratory phasicity and response to augmentation. Popliteal Vein: No evidence of thrombus. Normal compressibility,  respiratory phasicity and response to augmentation. Calf Veins: No evidence of thrombus. Normal compressibility and flow on color Doppler imaging. Superficial Great Saphenous Vein: No evidence of thrombus. Normal compressibility and flow on color Doppler imaging. Other Findings: Lentiform anechoic fluid in the medial popliteal fossa, 1.7 cm x 1.8 cm x 0.7 cm. IMPRESSION: Sonographic survey of the left lower extremity negative for DVT Lentiform anechoic fluid in the medial popliteal fossa, potentially a Baker's cyst, para meniscal cyst, hematoma/seroma. Abscess is unlikely. Electronically Signed   By: Corrie Mckusick D.O.   On: 11/28/2020 14:14    Procedures Procedures   Medications Ordered in ED Medications - No data to display  ED Course  I have reviewed the triage vital signs and the nursing notes.  Pertinent labs & imaging results that were available during my care of the patient were reviewed by me and considered in my medical decision making (see chart for details).  Clinical Course as of 11/28/20 1509  Sat Nov 28, 2020  1509 Patient reports that she does not have factor V Leyden, that her family might have it.  Her hematologic work-up was negative.  The patient appears reasonably screened and/or stabilized for discharge and I doubt any other medical condition or other [facility] requiring further screening, evaluation, or treatment in the ED at this time prior to discharge.   Results from the ER workup discussed with the patient face to face and all questions answered to the best of my ability. The patient is safe for discharge with strict return precautions.   [AN]    Clinical Course User Index [AN] Varney Biles, MD   MDM Rules/Calculators/A&P  65 year old female comes in with chief complaint of left lower extremity pain.  She reports family history of thrombosis.  In her chart, she appears to have factor V Leyden.  Clinical concerns for DVT and Baker's  cyst.  D-dimer test was ordered initially, but given her known history of factor V Leyden and calf tenderness, well score is high enough where we can proceed with DVT ultrasound immediately.  We will get a screening EKG as well.  Final Clinical Impression(s) / ED Diagnoses Final diagnoses:  Baker's cyst of knee, left    Rx / DC Orders ED Discharge Orders     None        Varney Biles, MD 11/28/20 [phone]

## 2020-11-28 NOTE — ED Triage Notes (Signed)
Pt sent by Ortho for evaluation of possible DVT. Pt states that she has been having intermitting L calf pain for a few days with a change in color. She states she also has noted a new cyst behind her knee.

## 2020-12-03 ENCOUNTER — Ambulatory Visit
Admission: RE | Admit: 2020-12-03 | Discharge: 2020-12-03 | Disposition: A | Discharge status: Home / Self Care | Payer: No Typology Code available for payment source | Source: Ambulatory Visit | Attending: Specialist | Admitting: Specialist

## 2020-12-03 DIAGNOSIS — M25552 Pain in left hip: Secondary | ICD-10-CM

## 2020-12-03 IMAGING — MR MR HIP*L* W/O CM
6 series · 36 of 40 positions shown · non-contrast
Comparison: None.

CLINICAL DATA: Left hip pain

EXAM:
MR OF THE LEFT HIP WITHOUT CONTRAST
TECHNIQUE: Multiplanar, multisequence MR imaging was performed. No intravenous
contrast was administered.

[Series 9: T2 fat-sat · coronal · left · 3.0mm · 0.89mm/px · 6 of 30 slices shown (1 of 2)]
[im 1/30]
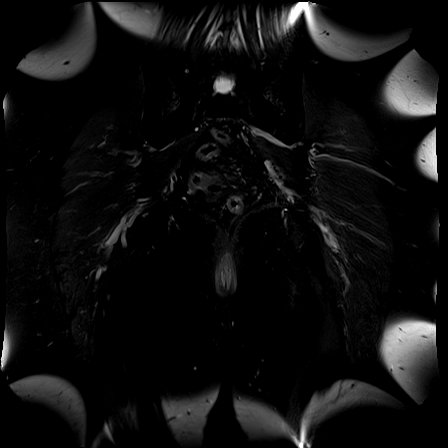
[im 6/30]
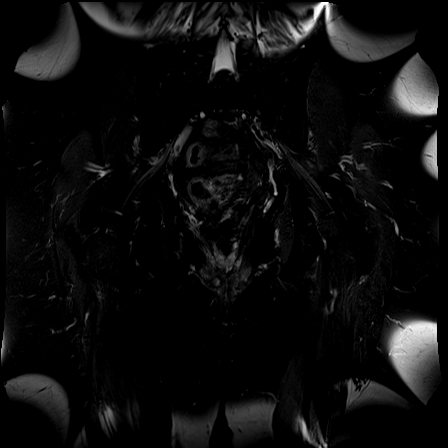
[im 12/30]
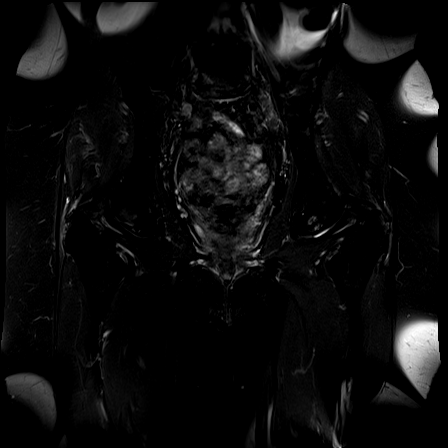
[im 18/30]
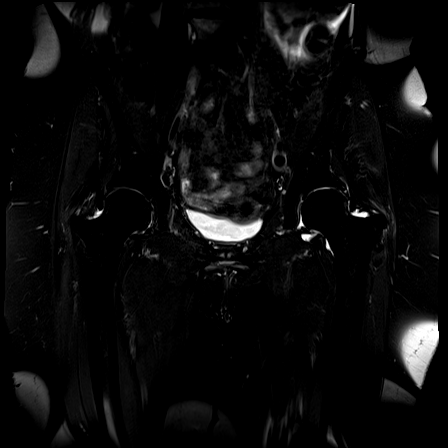
[im 24/30]
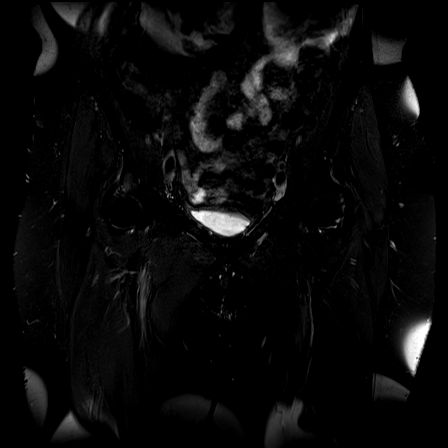
[im 30/30]
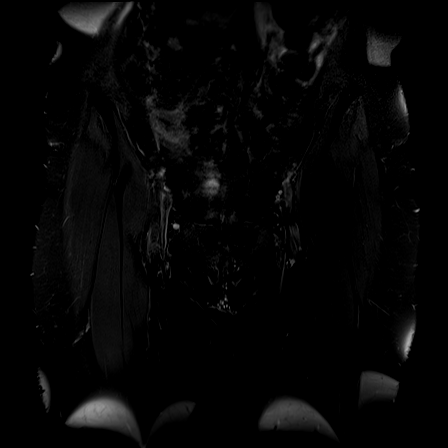

[Series 10: T1 · coronal · left · 3.0mm · 0.89mm/px · 2 of 32 slices shown]
[im 1/32]
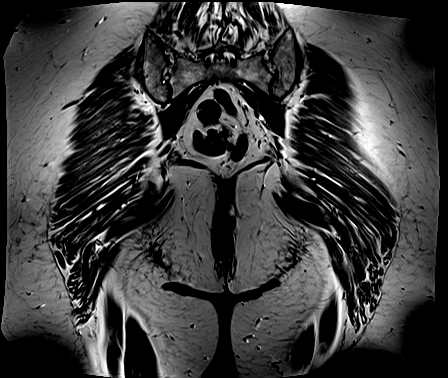
[im 7/32]
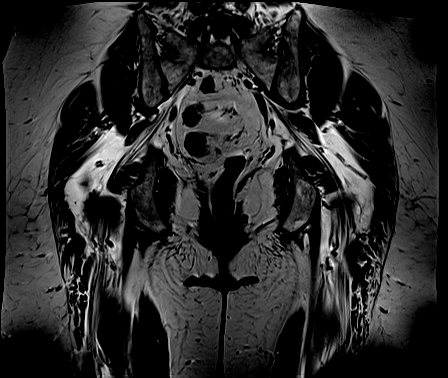

[Series 11: T2 fat-sat · axial · left · 3.0mm · 1.19mm/px · z∈[-2,+102]mm · 6 of 30 slices shown (2 of 2)]
[im 1/30]
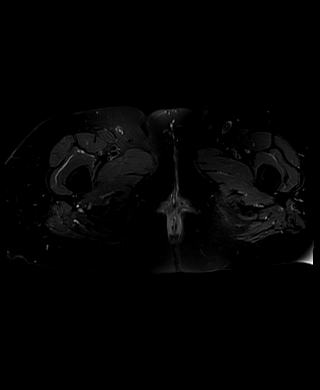
[im 6/30]
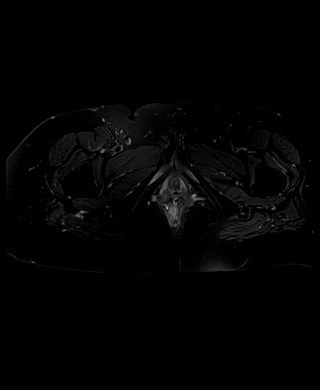
[im 12/30]
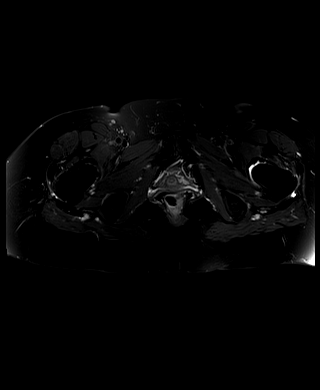
[im 18/30]
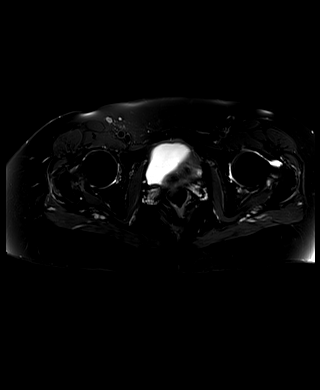
[im 24/30]
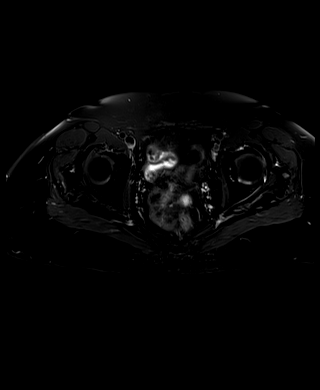
[im 30/30]
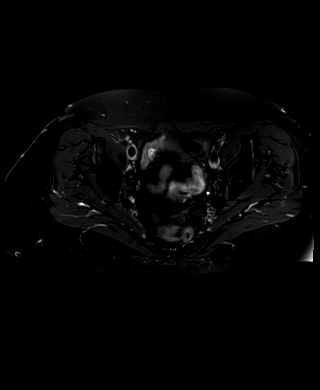

[Series 12: PD fat-sat · coronal · left · 3.0mm · 0.56mm/px · 6 of 30 slices shown (1 of 3)]
[im 1/30]
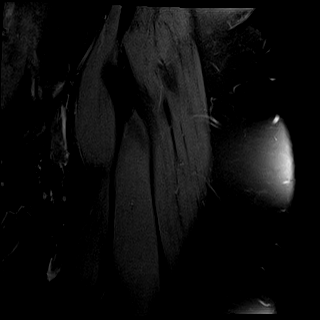
[im 6/30]
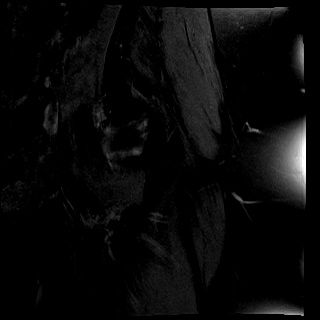
[im 12/30]
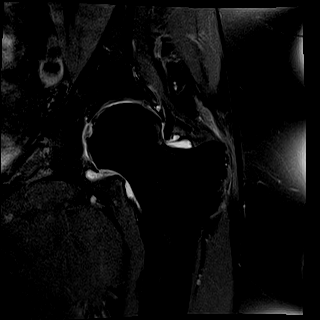
[im 18/30]
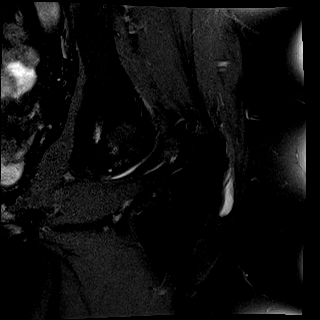
[im 24/30]
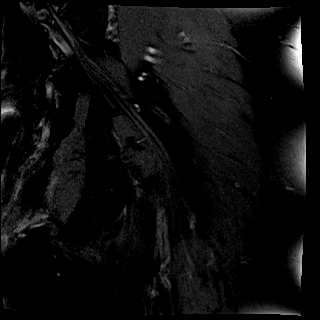
[im 30/30]
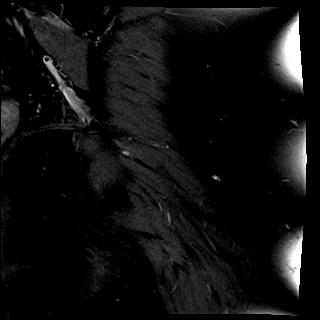

[Series 13: PD fat-sat · sagittal · left · 3.0mm · 0.56mm/px · 8 of 38 slices shown (2 of 3)]
[im 1/38]
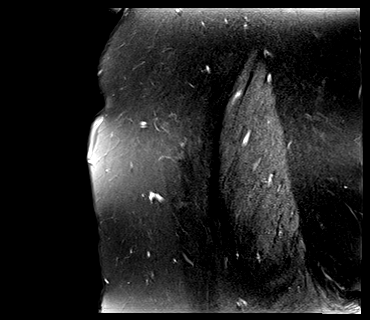
[im 6/38]
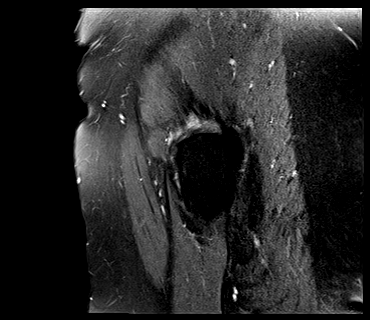
[im 11/38]
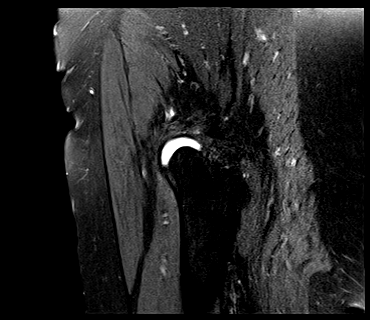
[im 16/38]
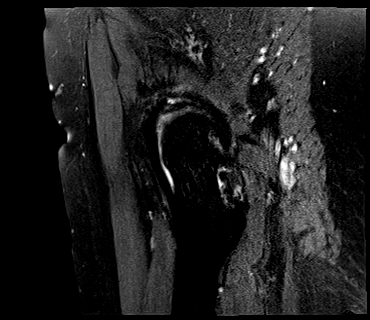
[im 22/38]
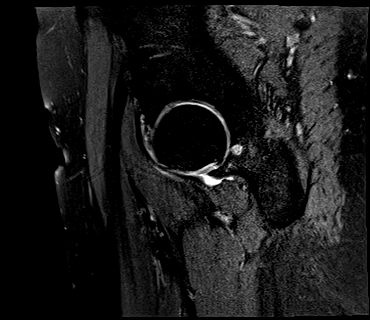
[im 27/38]
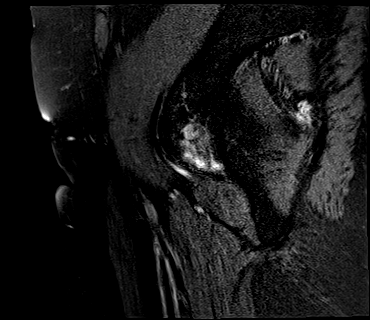
[im 32/38]
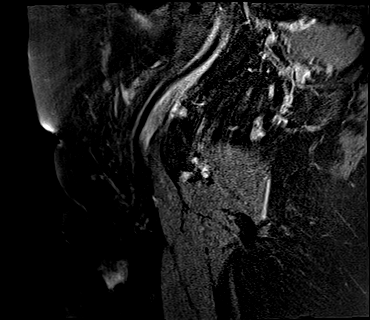
[im 38/38]
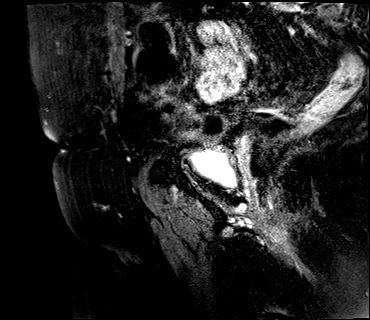

[Series 14: PD fat-sat · sagittal · left · 3.0mm · 0.56mm/px · 8 of 38 slices shown (3 of 3)]
[im 1/38]
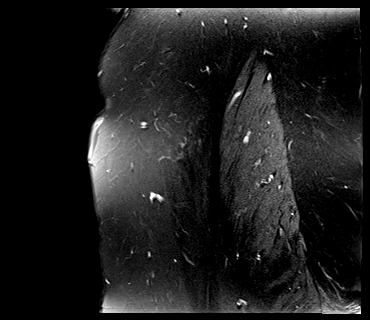
[im 6/38]
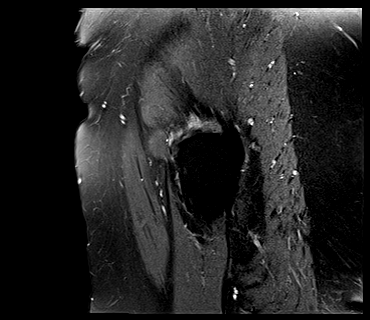
[im 11/38]
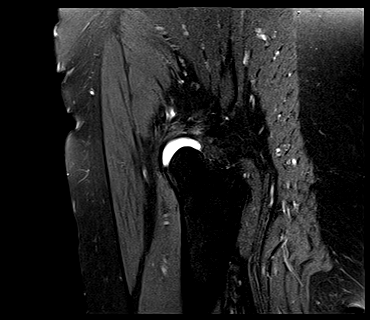
[im 16/38]
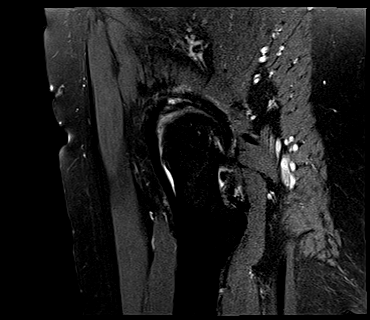
[im 22/38]
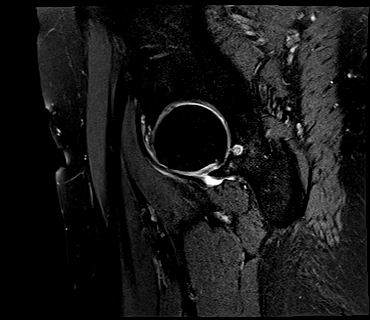
[im 27/38]
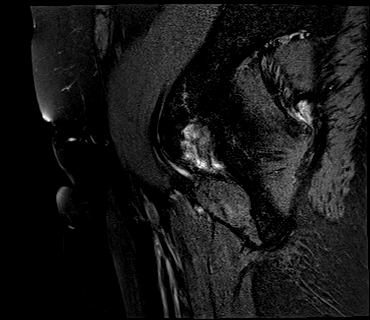
[im 32/38]
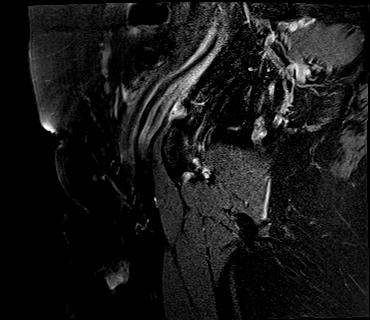
[im 38/38]
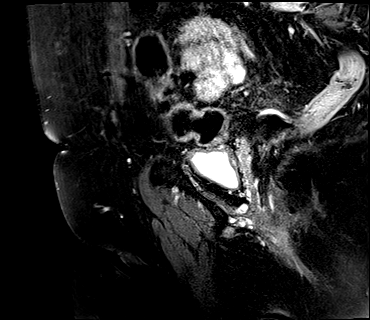

[36 of 40 positions shown; findings below may reference images not displayed]

FINDINGS: Bones: There is no evidence of acute fracture, dislocation or
avascular necrosis. No focal bone lesion. The visualized sacroiliac
joints and symphysis pubis appear unremarkable. Degenerative disc
disease at L4-L5 and L5-S1, partially visualized.

Articular cartilage and labrum

Articular cartilage:  Moderate chondrosis

Labrum: Degenerative superior labral tearing with tiny paralabral
cyst formation.

Joint or bursal effusion

Joint effusion: Small left hip effusion.

Bursae: Mild left-sided subgluteus minimus and trochanteric
bursitis. Mild right-sided sub gluteus medius and trochanteric
bursitis.

Muscles and tendons

Muscles and tendons: Low-grade partial tearing of the gluteus
minimus and medius tendons bilaterally. The proximal hamstrings are
intact.The adductors are intact. No muscle atrophy of edema.

Other findings

Miscellaneous: Incidental 0.7 cm right a posterior bar fun gland
cyst.
IMPRESSION: Moderate left hip osteoarthritis with degenerative superior labral
tearing. Small joint effusion.

Low-grade partial tearing of the gluteus minimus and medius tendons
bilaterally.

Mild subgluteus and trochanteric bursitis bilaterally, left greater
than right.

## 2021-01-29 ENCOUNTER — Ambulatory Visit: Payer: No Typology Code available for payment source | Admitting: Family

## 2021-01-29 ENCOUNTER — Other Ambulatory Visit: Payer: Self-pay

## 2021-01-29 VITALS — BP 110/74 | HR 61 | Temp 98.0°F | Ht 66.0 in | Wt 171.6 lb

## 2021-01-29 DIAGNOSIS — K219 Gastro-esophageal reflux disease without esophagitis: Secondary | ICD-10-CM | POA: Diagnosis not present

## 2021-01-29 DIAGNOSIS — I479 Paroxysmal tachycardia, unspecified: Secondary | ICD-10-CM

## 2021-01-29 DIAGNOSIS — R339 Retention of urine, unspecified: Secondary | ICD-10-CM | POA: Diagnosis not present

## 2021-01-29 NOTE — Progress Notes (Signed)
Samantha Mckinney is a 65 y.o. 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     carvedilol (COREG) 6.25 MG tablet Take 0.5 tablets (3.125 mg total) by mouth 2 (two) times daily with a meal. 90 tablet 3   Cholecalciferol (D3 PO) Take 4,000 mg by mouth daily.     cyclobenzaprine (FLEXERIL) 10 MG tablet Take 10 mg by mouth 3 (three) times daily as needed for muscle spasms.     magnesium oxide (MAG-OX) 400 MG tablet Take 400 mg by mouth daily.     pantoprazole (PROTONIX) 40 MG tablet Take 40 mg by mouth daily.     TURMERIC PO turmeric     vitamin C (ASCORBIC ACID) 500 MG tablet Take 500 mg by mouth daily.     zinc gluconate 50 MG tablet Take 50 mg by mouth daily.     No current facility-administered medications for this visit.    Allergies: Morphine and related and Other  Past Medical History:  Diagnosis Date   Allergy    Cataract    Colon polyps    Elevated cholesterol    Endometriosis 02/02/2017   Factor V Leiden ([place]) 02/02/2017   GERD (gastroesophageal reflux disease)    IBS (irritable bowel syndrome)    Liver hemangioma 02/02/2017   Low back pain    Lumbar radiculopathy    NASH (nonalcoholic steatohepatitis) 02/02/2017   Scoliosis    Spinal stenosis of lumbar region     Past Surgical History:  Procedure Laterality Date   ABDOMINAL HYSTERECTOMY   1998   TOTAL, on estrogen  until 2012   BACK SURGERY  2000   disc removed L4L5   CHOLECYSTECTOMY     COLONOSCOPY     ESOPHAGOGASTRODUODENOSCOPY  2014   In [place]   HEMORRHOID SURGERY  1996   LAPAROSCOPIC ENDOMETRIOSIS FULGURATION     x 6, 1980-1991   UPPER GASTROINTESTINAL ENDOSCOPY      Family History  Problem Relation Age of Onset   Cirrhosis Mother    Diabetes Mother    Arthritis Mother    Gout Mother    Depression Mother    Bowel Disease Mother    Heart disease Father    Diabetes Father    Heart attack Father 74       during knee surgery   Valvular heart disease Sister        had valve surgery   Depression Sister    Diabetes Brother    Factor V Leiden deficiency Brother        with blood clots   Hyperlipidemia Son    Cancer Maternal Grandmother 77       colon cancer   Colon cancer Maternal Grandmother    Atrial fibrillation Brother    Valvular heart disease Brother        valve surgery  Hypertension Brother    Esophageal cancer Neg Hx    Rectal cancer Neg Hx    Stomach cancer Neg Hx     Social History   Tobacco Use   Smoking status: Never   Smokeless tobacco: Never  Substance Use Topics   Alcohol use: Yes    Comment: OCC     Subjective:  Transferring primary care from [facility]; already has a number of specialists in the [facility] system including cardiology and GI; Will be keeping vascular and rheumatology in the [place] system;   Does note she has been having increased sensation of incomplete bladder emptying/ frequency x 8 months; s/p total hysterectomy due to extreme complications from endometriosis.  Working with [facility] for chronic left hip pain; torn tendon- working to slowly rebuild muscle;       Objective:  Vitals:   01/29/21 0912  BP: 110/74  Pulse: 61  Temp: 98 F (36.7 C)  TempSrc: Oral  SpO2: 99%  Weight: 171 lb 9.6 oz (77.8 kg)  Height: 5' 6"  (1.676 m)    General: Well developed, well nourished, in no acute distress  Skin : Warm  and dry. Suspected small lipoma on right forearm Head: Normocephalic and atraumatic  Eyes: Sclera and conjunctiva clear; pupils round and reactive to light; extraocular movements intact  Ears: External normal; canals clear; tympanic membranes normal  Oropharynx: Pink, supple. No suspicious lesions  Neck: Supple without thyromegaly, adenopathy  Lungs: Respirations unlabored; clear to auscultation bilaterally without wheeze, rales, rhonchi  CVS exam: normal rate and regular rhythm.  Neurologic: Alert and oriented; speech intact; face symmetrical; moves all extremities well; CNII-XII intact without focal deficit   Assessment:  1. Incomplete bladder emptying   2. Gastroesophageal reflux disease, unspecified whether esophagitis present   3. Paroxysmal tachycardia ([place])     Plan:  Refer to urogynecology; Will plan to refill Protonix when time; Continue with cardiology as scheduled;   This visit occurred during the SARS-CoV-2 public health emergency.  Safety protocols were in place, including screening questions prior to the visit, additional usage of staff PPE, and extensive cleaning of exam room while observing appropriate contact time as indicated for disinfecting solutions.    Return for Fasting CPE end of January 2023.  Orders Placed This Encounter  Procedures   Ambulatory referral to Urogynecology    Referral Priority:   Routine    Referral Type:   Consultation    Referral Reason:   Specialty Services Required    Requested Specialty:   Urology    Number of Visits Requested:   1    Requested Prescriptions    No prescriptions requested or ordered in this encounter

## 2021-02-25 ENCOUNTER — Telehealth: Payer: Self-pay | Admitting: Family

## 2021-02-25 MED ORDER — PANTOPRAZOLE SODIUM 40 MG PO TBEC
40.0000 mg | DELAYED_RELEASE_TABLET | Freq: Every day | ORAL | 1 refills | Status: DC
Start: 2021-02-25 — End: 2021-12-06

## 2021-02-25 NOTE — Telephone Encounter (Signed)
Rx sent 

## 2021-02-25 NOTE — Telephone Encounter (Signed)
Medication: pantoprazole (PROTONIX) 40 MG tablet   Has the patient contacted their pharmacy? No. (If no, request that the patient contact the pharmacy for the refill.) (If yes, when and what did the pharmacy advise?)  Preferred Pharmacy (with phone number or street name):  [id number] [place], [place] - [address]  [address] [place], [place] [address]  Phone:  [phone]  Fax:  [phone] Agent: Please be advised that RX refills may take up to 3 business days. We ask that you follow-up with your pharmacy.

## 2021-03-15 ENCOUNTER — Ambulatory Visit (INDEPENDENT_AMBULATORY_CARE_PROVIDER_SITE_OTHER): Payer: No Typology Code available for payment source | Admitting: Cardiology

## 2021-03-15 ENCOUNTER — Other Ambulatory Visit: Payer: Self-pay

## 2021-03-15 ENCOUNTER — Encounter: Payer: Self-pay | Admitting: Cardiology

## 2021-03-15 VITALS — BP 138/88 | HR 66 | Ht 66.0 in | Wt 174.0 lb

## 2021-03-15 DIAGNOSIS — I10 Essential (primary) hypertension: Secondary | ICD-10-CM

## 2021-03-15 DIAGNOSIS — E782 Mixed hyperlipidemia: Secondary | ICD-10-CM | POA: Diagnosis not present

## 2021-03-15 DIAGNOSIS — I479 Paroxysmal tachycardia, unspecified: Secondary | ICD-10-CM | POA: Diagnosis not present

## 2021-03-15 NOTE — Patient Instructions (Signed)
Medication Instructions:   Can take extra  Coreg  dose if needed   long heart rate spell *If you need a refill on your cardiac medications before your next appointment, please call your pharmacy*   Lab Work: Not  needed   Testing/Procedures: Not needed   Follow-Up: At [facility], you and your health needs are our priority.  As part of our continuing mission to provide you with exceptional heart care, we have created designated Provider Care Teams.  These Care Teams include your primary Cardiologist (physician) and Advanced Practice Providers (APPs -  Physician Assistants and Nurse Practitioners) who all work together to provide you with the care you need, when you need it.  We recommend signing up for the patient portal called "MyChart".  Sign up information is provided on this After Visit Summary.  MyChart is used to connect with patients for Virtual Visits (Telemedicine).  Patients are able to view lab/test results, encounter notes, upcoming appointments, etc.  Non-urgent messages can be sent to your provider as well.   To learn more about what you can do with MyChart, go to [email or web address].    Your next appointment:   12 month(s)  The format for your next appointment:   In Person  Provider:   Glenetta Hew, MD     Other Instructions  Hydrate, Hydrate   Recommendations for vagal maneuvers: "Bearing down" Coughing Gagging Icy, cold towel on face or drink ice cold water

## 2021-03-15 NOTE — Progress Notes (Signed)
Primary Care Provider: Marrian Salvage, [place] Cardiologist: Glenetta Hew, MD Electrophysiologist: None  Clinic Note: Chief Complaint  Patient presents with   Follow-up    1 year follow-up.  Palpitations pretty well controlled on beta-blocker.  Carvedilol    ===================================  ASSESSMENT/PLAN   Problem List Items Addressed This Visit       Cardiology Problems   Essential hypertension (Chronic)    Borderline blood pressure today on current dose of carvedilol.  She is really only on low-dose.  I suspect that we could potentially titrate this up to a full 6.25 mg twice daily for least 1 the dose is 6.25 mg.  Continue to monitor.      Mixed hyperlipidemia (Chronic)    She is very reluctant to try any medication for treating her lipids.  Unfortunately her labs not been checked very recently.  I suspect she should have blood work done by her PCP.   Would probably target LDL less than 100 and I suspect that she may need statin.  If not checked by the time I see her back, will recheck labs and treat, otherwise will defer to PCP.Marland Kitchen      Paroxysmal tachycardia (HCC) - Primary (Chronic)    Symptoms seem to be pretty well controlled with current dose of carvedilol.  We could potentially titrate her up further if necessary because her blood pressure would seem to tolerate it.  However for the most part since symptoms are stable, we will hold off.  She can take an additional dose if necessary for tachycardia spells.  Also recommended adequate hydration and discussed vagal maneuvers.      Relevant Orders   EKG 12-Lead (Completed)    ===================================  HPI:    Samantha Mckinney is a 65 y.o. female with a PMH notable for factor V Leiden, chronic LBP.DJD-Lumbar Radiculopathy and Intermittent Tachycardia who presents today for annual follow-up.  Samantha Mckinney was initially annual follow-up seen on 01/07/2020 for evaluation of irregular heart rhythms  while bradycardia heart rates.  Initially noticed when she had a bout of diverticulitis.  On her smart watch she noted heart rates going up to 110s and this will close down to the 40s.  Sometimes felt dizzy with tachycardia.  She noted some atypical chest discomfort episodes along with her palpitations and some exertional dyspnea.  More tired and fatigued.  She had chest wall tenderness on palpation => event monitor ordered (7-day Zio patch)  She was seen in follow-up by Coletta Memos, NP on 03/04/2020 to follow-up the results of event monitor.  She noted that while wearing the monitor, she had not been taking her carvedilol.  While taking the carvedilol, palpitations were controlled.  Was still tracking her history on the apple watch.  Had not been hydrating well.  She was not exercising much she had been because of hip pain.  Receiving injections to the left hip.  Recent Hospitalizations: N/A  She was seen by [facility] on October 11 for lower extremity edema => recommended lower extremity venous Dopplers.  Recommended compression stockings and foot elevation.  Reviewed  CV studies:    The following studies were reviewed today: (if available, images/films reviewed: From Epic Chart or Care Everywhere) 7-day Zio patch monitor October 2021:  min HR of 50 bpm, max HR of 133 bpm, and avg HR of 78 bpm. Predominant underlying rhythm was Sinus Rhythm. 1 run ofVentricular Tachycardia occurred lasting 9 beats with a max rate of 122 bpm (avg  106 bpm). Isolated SVEs were rare (<1.0%), SVE Couplets were rare (<1.0%), and no SVE Triplets were present. Isolated VEs were rare (<1.0%), VE Couplets were rare (<1.0%), and no VE Triplets were present.  No atrial fibrillation, blocks or cardiac pauses were observed.  Patient triggered events were sinus tachycardia.  Interval History:   Samantha Mckinney is here today for annual follow-up doing pretty well.  She said she had an episode about 3 months  ago where she rolled over strangely in bed and had some sharp pains in her chest with some shortness of breath.  I have put her a lab quite a bit but that was the first time it is done at a while.  He had little short of breath with that and her BP was up as well.  Other than that she is doing quite well.  She rarely has the fast heart rate spells.  She is try to make a concerted effort to hydrate better.  Has not had any lightheaded dizzy spells.  No syncope or near syncope.  Other than that 1 episode, no further chest pain or pressure with rest or exertion.  CV Review of Symptoms (Summary) Cardiovascular ROS: positive for - -1 episode 3 months ago where she had some shortness of breath and tachycardia associated with chest pain rolling over in bed.  None since. negative for - dyspnea on exertion, edema, irregular heartbeat, orthopnea, paroxysmal nocturnal dyspnea, shortness of breath, or lightheadedness, dizziness, or wooziness, syncope/near syncope or TIA/amaurosis fugax, claudication  REVIEWED OF SYSTEMS   Review of Systems  Constitutional:  Negative for malaise/fatigue.  HENT:  Negative for congestion.   Respiratory:  Negative for shortness of breath.   Cardiovascular:  Positive for leg swelling (Wearing support stockings).       Per HPI  Gastrointestinal:  Negative for blood in stool and melena.  Genitourinary:  Negative for hematuria.  Musculoskeletal:  Positive for joint pain. Negative for back pain.  Neurological:  Positive for dizziness (Only when her heart rate went really fast during an episode of chest discomfort most likely musculoskeletal). Negative for focal weakness and weakness.  Psychiatric/Behavioral: Negative.     I have reviewed and (if needed) personally updated the patient's problem list, medications, allergies, past medical and surgical history, social and family history.   PAST MEDICAL HISTORY   Past Medical History:  Diagnosis Date   Allergy    Cataract    Colon  polyps    Elevated cholesterol    Endometriosis 02/02/2017   Factor V Leiden ([place]) 02/02/2017   GERD (gastroesophageal reflux disease)    IBS (irritable bowel syndrome)    Liver hemangioma 02/02/2017   Low back pain    Lumbar radiculopathy    NASH (nonalcoholic steatohepatitis) 02/02/2017   Scoliosis    Spinal stenosis of lumbar region     PAST SURGICAL HISTORY   Past Surgical History:  Procedure Laterality Date   ABDOMINAL HYSTERECTOMY  1998   TOTAL, on estrogen  until 2012   BACK SURGERY  2000   disc removed L4L5   CHOLECYSTECTOMY     COLONOSCOPY     ESOPHAGOGASTRODUODENOSCOPY  2014   In [place]   HEMORRHOID SURGERY  1996   LAPAROSCOPIC ENDOMETRIOSIS FULGURATION     x 6, 1980-1991   UPPER GASTROINTESTINAL ENDOSCOPY      Immunization History  Administered Date(s) Administered   Tdap 02/21/2018    MEDICATIONS/ALLERGIES   Current Meds  Medication Sig   B Complex Vitamins (VITAMIN  B COMPLEX PO) vitamin B complex   BIOTIN 5000 PO Take by mouth.   carvedilol (COREG) 6.25 MG tablet Take 0.5 tablets (3.125 mg total) by mouth 2 (two) times daily with a meal.   Cholecalciferol (D3 PO) Take 4,000 mg by mouth daily.   cyclobenzaprine (FLEXERIL) 10 MG tablet Take 10 mg by mouth 3 (three) times daily as needed for muscle spasms.   magnesium oxide (MAG-OX) 400 MG tablet Take 400 mg by mouth daily.   pantoprazole (PROTONIX) 40 MG tablet Take 1 tablet (40 mg total) by mouth daily.   TURMERIC PO turmeric   vitamin C (ASCORBIC ACID) 500 MG tablet Take 500 mg by mouth daily.   zinc gluconate 50 MG tablet Take 50 mg by mouth daily.    Allergies  Allergen Reactions   Morphine And Related Swelling   Other Other (See Comments)    Contrast dye allergy - N/V/D, swelling    SOCIAL HISTORY/FAMILY HISTORY   Reviewed in Epic:  Pertinent findings:  Social History   Tobacco Use   Smoking status: Never   Smokeless tobacco: Never  Vaping Use   Vaping Use: Never used  Substance Use  Topics   Alcohol use: Yes    Comment: OCC    Drug use: Never   Social History   Social History Narrative   Lives with husband   Caffeine- coffee 1-2 a day   Not able to exercise 2/2 back pain    OBJCTIVE -PE, EKG, labs   Wt Readings from Last 3 Encounters:  03/15/21 174 lb (78.9 kg)  01/29/21 171 lb 9.6 oz (77.8 kg)  11/28/20 168 lb (76.2 kg)    Physical Exam: BP 138/88   Pulse 66   Ht 5' 6"  (1.676 m)   Wt 174 lb (78.9 kg)   SpO2 98%   BMI 28.08 kg/m  Physical Exam Vitals reviewed.  Constitutional:      General: She is not in acute distress.    Appearance: Normal appearance. She is normal weight. She is not toxic-appearing.  HENT:     Head: Normocephalic and atraumatic.  Neck:     Vascular: No carotid bruit.  Cardiovascular:     Rate and Rhythm: Normal rate and regular rhythm.     Pulses: Normal pulses.     Heart sounds: Normal heart sounds. No murmur heard.   No friction rub. No gallop.  Pulmonary:     Effort: Pulmonary effort is normal. No respiratory distress.     Breath sounds: Normal breath sounds.  Chest:     Chest wall: No tenderness.  Musculoskeletal:        General: Swelling (Trivial.) present. Normal range of motion.     Cervical back: Normal range of motion and neck supple.  Skin:    General: Skin is warm and dry.  Neurological:     General: No focal deficit present.     Mental Status: She is alert and oriented to person, place, and time.     Gait: Gait normal.  Psychiatric:        Mood and Affect: Mood normal.        Behavior: Behavior normal.        Thought Content: Thought content normal.        Judgment: Judgment normal.    Adult ECG Report  Rate: 66 ;  Rhythm: normal sinus rhythm and nonspecific ST and T wave changes. ; Normal axis, intervals durations.  Narrative Interpretation: Normal EKG  Recent  Labs: Has not had labs checked since 2021. Lab Results  Component Value Date   CHOL 226 (H) 08/08/2019   HDL 49 (L) 08/08/2019    LDLCALC 147 (H) 08/08/2019   TRIG 161 (H) 08/08/2019   CHOLHDL 4.6 08/08/2019   Lab Results  Component Value Date   CREATININE 0.66 11/28/2020   BUN 22 11/28/2020   NA 140 11/28/2020   K 3.9 11/28/2020   CL 105 11/28/2020   CO2 26 11/28/2020   CBC Latest Ref Rng & Units 11/28/2020 01/02/2020 10/16/2019  WBC 4.0 - 10.5 K/uL 7.0 5.5 3.9  Hemoglobin 12.0 - 15.0 g/dL 13.7 14.4 14.1  Hematocrit 36.0 - 46.0 % 41.4 42.5 42.0  Platelets 150 - 400 K/uL 244 255 260    Lab Results  Component Value Date   HGBA1C 5.6 08/08/2019   Lab Results  Component Value Date   TSH 1.34 01/02/2020    ==================================================  COVID-19 Education: The signs and symptoms of COVID-19 were discussed with the patient and how to seek care for testing (follow up with PCP or arrange E-visit).    I spent a total of 26 minutes with the patient spent in direct patient consultation -- She had several questions about the monitor that we discussed.  We discussed PSVT etc.  We discussed vagal maneuvers.  Additional time spent with chart review  / charting (studies, outside notes, etc): 16 min Total Time: 42 min  Current medicines are reviewed at length with the patient today.  (+/- concerns) N/A  This visit occurred during the SARS-CoV-2 public health emergency.  Safety protocols were in place, including screening questions prior to the visit, additional usage of staff PPE, and extensive cleaning of exam room while observing appropriate contact time as indicated for disinfecting solutions.  Notice: This dictation was prepared with Dragon dictation along with smart phrase technology. Any transcriptional errors that result from this process are unintentional and may not be corrected upon review.  Studies Ordered:   Orders Placed This Encounter  Procedures   EKG 12-Lead    Patient Instructions / Medication Changes & Studies & Tests Ordered   Patient Instructions  Medication  Instructions:   Can take extra  Coreg  dose if needed   long heart rate spell *If you need a refill on your cardiac medications before your next appointment, please call your pharmacy*   Lab Work: Not  needed   Testing/Procedures: Not needed   Follow-Up: At [facility], you and your health needs are our priority.  As part of our continuing mission to provide you with exceptional heart care, we have created designated Provider Care Teams.  These Care Teams include your primary Cardiologist (physician) and Advanced Practice Providers (APPs -  Physician Assistants and Nurse Practitioners) who all work together to provide you with the care you need, when you need it.  We recommend signing up for the patient portal called "MyChart".  Sign up information is provided on this After Visit Summary.  MyChart is used to connect with patients for Virtual Visits (Telemedicine).  Patients are able to view lab/test results, encounter notes, upcoming appointments, etc.  Non-urgent messages can be sent to your provider as well.   To learn more about what you can do with MyChart, go to [email or web address].    Your next appointment:   12 month(s)  The format for your next appointment:   In Person  Provider:   Glenetta Hew, MD     Other Instructions  Hydrate,  Hydrate   Recommendations for vagal maneuvers: "Bearing down" Coughing Gagging Icy, cold towel on face or drink ice cold water       Glenetta Hew, M.D., M.S. Interventional Cardiologist   Pager # [phone] Phone # [phone] [address] [place], [place] [address]   Thank you for choosing Heartcare at [facility]!!

## 2021-03-20 ENCOUNTER — Encounter: Payer: Self-pay | Admitting: Cardiology

## 2021-03-20 DIAGNOSIS — I10 Essential (primary) hypertension: Secondary | ICD-10-CM | POA: Insufficient documentation

## 2021-03-20 NOTE — Assessment & Plan Note (Signed)
Borderline blood pressure today on current dose of carvedilol.  She is really only on low-dose.  I suspect that we could potentially titrate this up to a full 6.25 mg twice daily for least 1 the dose is 6.25 mg.  Continue to monitor.

## 2021-03-20 NOTE — Assessment & Plan Note (Signed)
She is very reluctant to try any medication for treating her lipids.  Unfortunately her labs not been checked very recently.  I suspect she should have blood work done by her PCP.   Would probably target LDL less than 100 and I suspect that she may need statin.  If not checked by the time I see her back, will recheck labs and treat, otherwise will defer to PCP.Marland Kitchen

## 2021-03-20 NOTE — Assessment & Plan Note (Signed)
Symptoms seem to be pretty well controlled with current dose of carvedilol.  We could potentially titrate her up further if necessary because her blood pressure would seem to tolerate it.  However for the most part since symptoms are stable, we will hold off.  She can take an additional dose if necessary for tachycardia spells.  Also recommended adequate hydration and discussed vagal maneuvers.

## 2021-03-22 NOTE — Progress Notes (Signed)
[facility] New Patient Evaluation and Consultation  Referring Provider: Marrian Salvage,* PCP: Marrian Salvage, [place] Date of Service: 03/23/2021  SUBJECTIVE Chief Complaint: New Patient (Initial Visit) Bronda Alfred is a 65 y.o. female here for a consult on urinary frequency)  History of Present Illness: Juli Odom is a 65 y.o. White or Caucasian female seen in consultation at the request of FNP Jodi Mourning for evaluation of incomplete bladder emptying.    Review of records from [facility] significant for: Has symptoms of incomplete bladder emptying and increased frequency for the last 8-10 months.   Urinary Symptoms: Does not leak urine.   Day time voids 5-7.  Nocturia: 1-2 times per night to void. Voiding dysfunction: she does not empty her bladder well.  does not use a catheter to empty bladder.  When urinating, she feels dribbling after finishing and the need to urinate multiple times in a row.  Goes a lot when she first voids, then when she finishes, has an urgency feeling and then can push out some more.  Drinks: 1-2 cups coffee (12-24 oz), water (with powdered vitamins sometimes), hot water with lemon, hot tea (caffeine free)  UTIs:  0  UTI's in the last year.   Denies history of blood in urine and kidney or bladder stones  Pelvic Organ Prolapse Symptoms:                  She Denies a feeling of a bulge the vaginal area.   Bowel Symptom: Bowel movements: 1-2 time(s) per day Stool consistency: soft  Straining: no.  Splinting: no.  Incomplete evacuation: no.  She Denies accidental bowel leakage / fecal incontinence Bowel regimen: miralax Last colonoscopy: Date- 3 years ago, Results- polyps removed and diverticulitis  Sexual Function Sexually active: occasionally Pain with sex: Yes, deep in the pelvis  Pelvic Pain Denies pelvic pain   Past Medical History:  Past Medical History:  Diagnosis Date   Allergy    Cataract    Colon  polyps    Diverticulitis    Elevated cholesterol    Endometriosis 02/02/2017   Factor V Leiden ([place]) 02/02/2017   GERD (gastroesophageal reflux disease)    IBS (irritable bowel syndrome)    Liver hemangioma 02/02/2017   Low back pain    Lumbar radiculopathy    NASH (nonalcoholic steatohepatitis) 02/02/2017   Scoliosis    Spinal stenosis of lumbar region      Past Surgical History:   Past Surgical History:  Procedure Laterality Date   ABDOMINAL HYSTERECTOMY  1998   TOTAL, on estrogen  until 2012   BACK SURGERY  2000   disc removed L4L5   CHOLECYSTECTOMY     COLONOSCOPY     ESOPHAGOGASTRODUODENOSCOPY  2014   In [place]   HEMORRHOID SURGERY  1996   LAPAROSCOPIC ENDOMETRIOSIS FULGURATION     x 6, 1980-1991   UPPER GASTROINTESTINAL ENDOSCOPY       Past OB/GYN History: OB History  Gravida Para Term Preterm AB Living  1 1       1   SAB IAB Ectopic Multiple Live Births          1    # Outcome Date GA Lbr Len/2nd Weight Sex Delivery Anes PTL Lv  1 Para             Vaginal deliveries: 1,  Forceps/ Vacuum deliveries: 0, Cesarean section: 0 S/p hysterectomy   Medications: She has a current medication list which includes the  following prescription(s): b complex vitamins, biotin, carvedilol, cholecalciferol, cyclobenzaprine, magnesium oxide, pantoprazole, turmeric, vitamin c, and zinc gluconate.   Allergies: Patient is allergic to morphine and related and other.   Social History:  Social History   Tobacco Use   Smoking status: Never   Smokeless tobacco: Never  Vaping Use   Vaping Use: Never used  Substance Use Topics   Alcohol use: Yes    Comment: OCC    Drug use: Never    Relationship status: married She lives with spouse.   She is employed as a Environmental health practitioner. Regular exercise: Yes: 20-25 min a day, including stretching History of abuse: No  Family History:   Family History  Problem Relation Age of Onset   Cirrhosis Mother    Diabetes Mother     Arthritis Mother    Gout Mother    Depression Mother    Bowel Disease Mother    Heart disease Father    Diabetes Father    Heart attack Father 1       during knee surgery   Valvular heart disease Sister        had valve surgery   Depression Sister    Diabetes Brother    Factor V Leiden deficiency Brother        with blood clots   Hyperlipidemia Son    Cancer Maternal Grandmother 25       colon cancer   Colon cancer Maternal Grandmother    Atrial fibrillation Brother    Valvular heart disease Brother        valve surgery   Hypertension Brother    Esophageal cancer Neg Hx    Rectal cancer Neg Hx    Stomach cancer Neg Hx      Review of Systems: ROS   OBJECTIVE Physical Exam: Vitals:   03/23/21 1432  BP: 134/86  Pulse: 68  Weight: 170 lb (77.1 kg)  Height: 5' 6"  (1.676 m)    Physical Exam   GU / Detailed Urogynecologic Evaluation:  Pelvic Exam: Normal external female genitalia; Bartholin's and Skene's glands normal in appearance; urethral meatus normal in appearance, no urethral masses or discharge.   CST: negative   s/p hysterectomy: Speculum exam reveals normal vaginal mucosa with  atrophy and normal vaginal cuff.  Adnexa no mass, fullness, tenderness.     Pelvic floor strength I/V  Pelvic floor musculature: Right levator tender, Right obturator tender, Left levator tender, Left obturator tender  POP-Q:   POP-Q  -2.5                                            Aa   -2.5                                           Ba  -7                                              C   3  Gh  3                                            Pb  7.5                                            tvl   -2.5                                            Ap  -2.5                                            Bp                                                 D     Rectal Exam:  Normal external rectum  Post-Void Residual (PVR) by  Bladder Scan: In order to evaluate bladder emptying, we discussed obtaining a postvoid residual and she agreed to this procedure.  Procedure: The ultrasound unit was placed on the patient's abdomen in the suprapubic region after the patient had voided. A PVR of 18 ml was obtained by bladder scan.  Laboratory Results: POC urine: negative   ASSESSMENT AND PLAN Ms. Barcia is a 65 y.o. with:  1. Urinary frequency   2. Urinary urgency    - Reassured with bladder scan that she is emptying her bladder completely. We reviewed there is always a small amount of urine left over after voiding that is normal. She likely has some overactive bladder contributing to her urgency.  - We discussed the symptoms of overactive bladder (OAB), which include urinary urgency, urinary frequency, nocturia, with or without urge incontinence.  While we do not know the exact etiology of OAB, several treatment options exist. We discussed management including behavioral therapy (decreasing bladder irritants, urge suppression strategies, timed voids, bladder retraining), physical therapy, medication.  - She will work on decreasing coffee intake.  - Would like to work on bladder retraining and pelvic floor strengthening exercises at home. AUGS bladder training handout provided  She will return as needed if she does not see improvement.   Jaquita Folds, MD

## 2021-03-23 ENCOUNTER — Encounter: Payer: Self-pay | Admitting: Obstetrics and Gynecology

## 2021-03-23 ENCOUNTER — Ambulatory Visit (INDEPENDENT_AMBULATORY_CARE_PROVIDER_SITE_OTHER): Payer: No Typology Code available for payment source | Admitting: Obstetrics and Gynecology

## 2021-03-23 ENCOUNTER — Other Ambulatory Visit: Payer: Self-pay

## 2021-03-23 VITALS — BP 134/86 | HR 68 | Ht 66.0 in | Wt 170.0 lb

## 2021-03-23 DIAGNOSIS — R3915 Urgency of urination: Secondary | ICD-10-CM | POA: Diagnosis not present

## 2021-03-23 DIAGNOSIS — R350 Frequency of micturition: Secondary | ICD-10-CM | POA: Diagnosis not present

## 2021-03-23 LAB — POCT URINALYSIS DIPSTICK
Appearance: NORMAL
Bilirubin, UA: NEGATIVE
Blood, UA: NEGATIVE
Glucose, UA: NEGATIVE
Ketones, UA: NEGATIVE
Leukocytes, UA: NEGATIVE
Nitrite, UA: NEGATIVE
Protein, UA: NEGATIVE
Spec Grav, UA: 1.015 (ref 1.010–1.025)
Urobilinogen, UA: 0.2 E.U./dL
pH, UA: 7 (ref 5.0–8.0)

## 2021-03-23 NOTE — Patient Instructions (Signed)
Today we talked about ways to manage bladder urgency such as altering your diet to avoid irritative beverages and foods (bladder diet) as well as attempting to decrease stress and other exacerbating factors.   The Most Bothersome Foods* The Least Bothersome Foods*  Coffee - Regular & Decaf Tea - caffeinated Carbonated beverages - cola, non-colas, diet & caffeine-free Alcohols - Beer, Red Wine, White Wine, Champagne Fruits - Grapefruit, [place], Orange, [employer] - Cranberry, Grapefruit, Orange, Pineapple Vegetables - Tomato & Tomato Products Flavor Enhancers - Hot peppers, Spicy foods, Chili, Horseradish, Vinegar, Monosodium glutamate (MSG) Artificial Sweeteners - NutraSweet, Sweet 'N Low, Equal (sweetener), Saccharin Ethnic foods - [place], [place], [place] food Express Scripts - low-fat & whole Fruits - Bananas, Blueberries, Honeydew melon, Pears, Raisins, Watermelon Vegetables - Broccoli, Brussels Sprouts, [place], Carrots, Cauliflower, [place], Cucumber, Mushrooms, Peas, Radishes, Squash, Zucchini, White potatoes, Sweet potatoes & yams Poultry - Chicken, Eggs, [place], [employer] - Beef, Programmer, multimedia, Lamb Seafood - Shrimp, [place] fish, Salmon Grains - Oat, Rice Snacks - Pretzels, Popcorn  *Lissa Morales et al. Diet and its role in interstitial cystitis/bladder pain syndrome (IC/BPS) and comorbid conditions. [place] 2012 Jan 11.

## 2021-04-06 ENCOUNTER — Encounter: Payer: Self-pay | Admitting: Family

## 2021-04-06 ENCOUNTER — Ambulatory Visit: Payer: No Typology Code available for payment source | Admitting: Family

## 2021-04-06 VITALS — BP 120/70 | HR 68 | Temp 97.8°F | Ht 66.5 in | Wt 177.4 lb

## 2021-04-06 DIAGNOSIS — L739 Follicular disorder, unspecified: Secondary | ICD-10-CM

## 2021-04-06 MED ORDER — CEPHALEXIN 500 MG PO CAPS: 500.0000 mg | ORAL_CAPSULE | Freq: Three times a day (TID) | ORAL | 0 refills | Status: AC

## 2021-04-06 NOTE — Progress Notes (Signed)
Samantha Mckinney is a 65 y.o. 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     carvedilol (COREG) 6.25 MG tablet Take 0.5 tablets (3.125 mg total) by mouth 2 (two) times daily with a meal. 90 tablet 3   cephALEXin (KEFLEX) 500 MG capsule Take 1 capsule (500 mg total) by mouth 3 (three) times daily for 5 days. 15 capsule 0   Cholecalciferol (D3 PO) Take 4,000 mg by mouth daily.     cyclobenzaprine (FLEXERIL) 10 MG tablet Take 10 mg by mouth 3 (three) times daily as needed for muscle spasms.     magnesium oxide (MAG-OX) 400 MG tablet Take 400 mg by mouth daily.     pantoprazole (PROTONIX) 40 MG tablet Take 1 tablet (40 mg total) by mouth daily. 90 tablet 1   TURMERIC PO turmeric     vitamin C (ASCORBIC ACID) 500 MG tablet Take 500 mg by mouth daily.     zinc gluconate 50 MG tablet Take 50 mg by mouth daily.     No current facility-administered medications for this visit.    Allergies: Morphine and related and Other  Past Medical History:  Diagnosis Date   Allergy    Cataract    Colon polyps    Diverticulitis    Elevated cholesterol    Endometriosis 02/02/2017   Factor V Leiden ([place]) 02/02/2017   GERD (gastroesophageal reflux disease)    IBS (irritable bowel syndrome)    Liver hemangioma 02/02/2017   Low back pain     Lumbar radiculopathy    NASH (nonalcoholic steatohepatitis) 02/02/2017   Scoliosis    Spinal stenosis of lumbar region     Past Surgical History:  Procedure Laterality Date   ABDOMINAL HYSTERECTOMY  1998   TOTAL, on estrogen  until 2012   BACK SURGERY  2000   disc removed L4L5   CHOLECYSTECTOMY     COLONOSCOPY     ESOPHAGOGASTRODUODENOSCOPY  2014   In [place]   HEMORRHOID SURGERY  1996   LAPAROSCOPIC ENDOMETRIOSIS FULGURATION     x 6, 1980-1991   UPPER GASTROINTESTINAL ENDOSCOPY      Family History  Problem Relation Age of Onset   Cirrhosis Mother    Diabetes Mother    Arthritis Mother    Gout Mother    Depression Mother    Bowel Disease Mother    Heart disease Father    Diabetes Father    Heart attack Father 100       during knee surgery   Valvular heart disease Sister        had valve surgery   Depression Sister    Diabetes Brother    Factor V Leiden deficiency Brother        with blood clots   Hyperlipidemia Son    Cancer Maternal Grandmother  52       colon cancer   Colon cancer Maternal Grandmother    Atrial fibrillation Brother    Valvular heart disease Brother        valve surgery   Hypertension Brother    Esophageal cancer Neg Hx    Rectal cancer Neg Hx    Stomach cancer Neg Hx     Social History   Tobacco Use   Smoking status: Never   Smokeless tobacco: Never  Substance Use Topics   Alcohol use: Yes    Comment: OCC     Subjective:  Concern for possible ingrown hair/ possible cyst; in outer pubic area; first noticed last week; has been using warm compresses and feels that area has opened up; still having some residual pain; concerned because she is traveling next week;     Objective:  Vitals:   04/06/21 1022  BP: 120/70  Pulse: 68  Temp: 97.8 F (36.6 C)  TempSrc: Oral  SpO2: 98%  Weight: 177 lb 6.4 oz (80.5 kg)  Height: 5' 6.5" (1.689 m)    General: Well developed, well nourished, in no acute distress  Skin : Warm and dry. Localized  area of erythema at site of ingrown pubic hair; no pustular drainage seen Head: Normocephalic and atraumatic  Eyes: Sclera and conjunctiva clear; pupils round and reactive to light; extraocular movements intact  Lungs: Respirations unlabored;  Neurologic: Alert and oriented; speech intact; face symmetrical; moves all extremities well; CNII-XII intact without focal deficit   Assessment:  1. Folliculitis     Plan:  Continue warm compresses; Rx for Keflex 500 mg tid x 5 days; follow up prn.  This visit occurred during the SARS-CoV-2 public health emergency.  Safety protocols were in place, including screening questions prior to the visit, additional usage of staff PPE, and extensive cleaning of exam room while observing appropriate contact time as indicated for disinfecting solutions.    No follow-ups on file.  No orders of the defined types were placed in this encounter.   Requested Prescriptions   Signed Prescriptions Disp Refills   cephALEXin (KEFLEX) 500 MG capsule 15 capsule 0    Sig: Take 1 capsule (500 mg total) by mouth 3 (three) times daily for 5 days.

## 2021-04-30 ENCOUNTER — Telehealth: Payer: Self-pay | Admitting: Nurse Practitioner

## 2021-04-30 DIAGNOSIS — K5732 Diverticulitis of large intestine without perforation or abscess without bleeding: Secondary | ICD-10-CM

## 2021-04-30 MED ORDER — AMOXICILLIN-POT CLAVULANATE 875-125 MG PO TABS: 1.0000 | ORAL_TABLET | Freq: Two times a day (BID) | ORAL | 0 refills | Status: DC

## 2021-04-30 NOTE — Telephone Encounter (Signed)
I called the patient. She is having her typical symptoms of diverticulitis which she has had multiple times in the past. Pain / discomfort in her LLQ. She thinks stable, no fevers, perhaps some mild improvement with time. Discussed that she likely has diverticulitis and hopefully this will resolve on its own without intervention. However if worsening over the weekend and failure to improve, would take a course of antibiotics. I wrote a prescription for her to take Augment for 7 days if no further improvement, she can pick this up if needed at her pharmacy.  Otherwise, she has had now 5 episodes of this since 07/2019. She is concerned about future recurrence, which is quite likely. We discussed if she wanted a surgical referral. She wants to think about this but asking about a colonoscopy in the interim given her last exam was done in 2019. Given numerous recurrent episodes of diverticulitis and her last exam was almost 4 years ago I think reasonable to do a colonoscopy in 6-8 weeks after she has recovered from this, and then referral to surgery pending findings. She agreed.  Beth, can you please contact the patient and schedule her for a colonoscopy with me sometime in March. Thanks

## 2021-04-30 NOTE — Telephone Encounter (Signed)
Patient with hx of diverticulitis calling with sx's of LLQ abdominal discomfort, "slimy stools again" and bloating. She last had this 10/2020. She tells me she was on Doxycycline until 04/23/21 to treat a boil on her thigh. She began having bloating and frequent stools on Sunday. Monday she developed discomfort and pain in her left side. She went on a soft diet at that point. Yesterday she thought she was getting better. Today she is feeling worse, very bloated, one bowel movement today that looked "full of mucous" and LLQ pain. I do not see any openings on the schedule with you or Tye Savoy, NP, whom she last saw, for 2 weeks. Please advise.

## 2021-04-30 NOTE — Telephone Encounter (Signed)
Inbound call from patient stating she feels she is having a diverticulitis flare-up.  Informed her next available appt is not until 05/14/21 but requested to speak with a nurse.  Please advise.

## 2021-05-03 NOTE — Telephone Encounter (Signed)
Spoke with the patient. She has not started Augmentin. Thinks she will fill the prescription today because she has not improved. Cannot schedule the colonoscopy in March yet. Once the schedule is available, we will contact her.

## 2021-05-07 ENCOUNTER — Encounter: Payer: Self-pay | Admitting: Family

## 2021-05-07 ENCOUNTER — Ambulatory Visit (INDEPENDENT_AMBULATORY_CARE_PROVIDER_SITE_OTHER): Payer: No Typology Code available for payment source | Admitting: Family

## 2021-05-07 VITALS — BP 120/80 | HR 82 | Temp 97.9°F | Ht 66.0 in | Wt 176.0 lb

## 2021-05-07 DIAGNOSIS — Z0000 Encounter for general adult medical examination without abnormal findings: Secondary | ICD-10-CM | POA: Diagnosis not present

## 2021-05-07 DIAGNOSIS — Z1159 Encounter for screening for other viral diseases: Secondary | ICD-10-CM

## 2021-05-07 DIAGNOSIS — E2839 Other primary ovarian failure: Secondary | ICD-10-CM | POA: Diagnosis not present

## 2021-05-07 DIAGNOSIS — Z13220 Encounter for screening for lipoid disorders: Secondary | ICD-10-CM

## 2021-05-07 DIAGNOSIS — Z13820 Encounter for screening for osteoporosis: Secondary | ICD-10-CM

## 2021-05-07 DIAGNOSIS — Z1231 Encounter for screening mammogram for malignant neoplasm of breast: Secondary | ICD-10-CM

## 2021-05-07 NOTE — Progress Notes (Signed)
Samantha Mckinney is a 66 y.o. 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 14 tablet 0   B Complex Vitamins (VITAMIN B COMPLEX PO) vitamin B complex     BIOTIN 5000 PO Take by mouth.     carvedilol (COREG) 6.25 MG tablet Take 0.5 tablets (3.125 mg total) by mouth 2 (two) times daily with a meal. 90 tablet 3   Cholecalciferol (D3 PO) Take 4,000 mg by mouth daily.     cyclobenzaprine (FLEXERIL) 10 MG tablet Take 10 mg by mouth 3 (three) times daily as needed for muscle spasms.     magnesium oxide (MAG-OX) 400 MG tablet Take 400 mg by mouth daily.     pantoprazole (PROTONIX) 40 MG tablet Take 1 tablet (40 mg total) by mouth daily. 90 tablet 1   TURMERIC PO turmeric     vitamin C (ASCORBIC ACID) 500 MG tablet Take 500 mg by mouth daily.     zinc gluconate 50 MG tablet Take 50 mg by mouth daily.     No current facility-administered medications for this visit.    Allergies: Morphine and related and Other  Past Medical History:  Diagnosis Date   Allergy    Cataract    Colon polyps    Diverticulitis    Elevated cholesterol    Endometriosis 02/02/2017   Factor V Leiden ([place]) 02/02/2017   GERD (gastroesophageal reflux disease)    IBS (irritable bowel syndrome)    Liver hemangioma 02/02/2017   Low back pain    Lumbar  radiculopathy    NASH (nonalcoholic steatohepatitis) 02/02/2017   Scoliosis    Spinal stenosis of lumbar region     Past Surgical History:  Procedure Laterality Date   ABDOMINAL HYSTERECTOMY  1998   TOTAL, on estrogen  until 2012   BACK SURGERY  2000   disc removed L4L5   CHOLECYSTECTOMY     COLONOSCOPY     ESOPHAGOGASTRODUODENOSCOPY  2014   In [place]   HEMORRHOID SURGERY  1996   LAPAROSCOPIC ENDOMETRIOSIS FULGURATION     x 6, 1980-1991   UPPER GASTROINTESTINAL ENDOSCOPY      Family History  Problem Relation Age of Onset   Cirrhosis Mother    Diabetes Mother    Arthritis Mother    Gout Mother    Depression Mother    Bowel Disease Mother    Heart disease Father    Diabetes Father    Heart attack Father 59       during knee surgery   Valvular heart disease Sister        had valve surgery   Depression Sister    Diabetes Brother    Factor V Leiden deficiency Brother        with blood clots   Hyperlipidemia Son    Cancer Maternal Grandmother 55  colon cancer   Colon cancer Maternal Grandmother    Atrial fibrillation Brother    Valvular heart disease Brother        valve surgery   Hypertension Brother    Esophageal cancer Neg Hx    Rectal cancer Neg Hx    Stomach cancer Neg Hx     Social History   Tobacco Use   Smoking status: Never   Smokeless tobacco: Never  Substance Use Topics   Alcohol use: Yes    Comment: OCC     Subjective:   Presents for yearly CPE; due for yearly mammogram and DEXA;   Has filed a Market researcher claim due to discovery of mold in her office- was only working in her office for 6 days ( exposure); had one day of drainage but seems to be resolved;   Had diverticulitis flare in the past week- will be scheduling for colonoscopy in March 2023;   Vascular specialist in [place]; continuing with cardiology (Dr. Ellyn Hack)- up to date;   Chronic left hip pain/ lumbar radiculopathy- [facility]; cortisone shot x 6 months;   Defers  vaccines;   Review of Systems  Constitutional: Negative.   HENT: Negative.    Eyes: Negative.   Respiratory: Negative.    Cardiovascular: Negative.   Gastrointestinal: Negative.   Genitourinary: Negative.   Musculoskeletal:  Positive for joint pain.  Skin: Negative.   Neurological: Negative.   Endo/Heme/Allergies: Negative.   Psychiatric/Behavioral: Negative.        Objective:  Vitals:   05/07/21 0815  BP: 120/80  Pulse: 82  Temp: 97.9 F (36.6 C)  TempSrc: Oral  SpO2: 98%  Weight: 176 lb (79.8 kg)  Height: _0  (1.676 m)    General: Well developed, well nourished, in no acute distress  Skin : Warm and dry.  Head: Normocephalic and atraumatic  Eyes: Sclera and conjunctiva clear; pupils round and reactive to light; extraocular movements intact  Ears: External normal; canals clear; tympanic membranes normal  Oropharynx: Pink, supple. No suspicious lesions  Neck: Supple without thyromegaly, adenopathy  Lungs: Respirations unlabored; clear to auscultation bilaterally without wheeze, rales, rhonchi  CVS exam: normal rate and regular rhythm.  Abdomen: Soft; nontender; nondistended; normoactive bowel sounds; no masses or hepatosplenomegaly  Musculoskeletal: No deformities; no active joint inflammation  Extremities: No edema, cyanosis, clubbing  Vessels: Symmetric bilaterally  Neurologic: Alert and oriented; speech intact; face symmetrical; moves all extremities well; CNII-XII intact without focal deficit  Assessment:  1. PE (physical exam), annual   2. Visit for screening mammogram   3. Osteoporosis screening   4. Ovarian failure   5. Lipid screening   6. Need for hepatitis C screening test     Plan:  Age appropriate preventive healthcare needs addressed; encouraged regular eye doctor and dental exams; encouraged regular exercise; will update labs and refills as needed today; follow-up to be determined;  This visit occurred during the SARS-CoV-2 public health  emergency.  Safety protocols were in place, including screening questions prior to the visit, additional usage of staff PPE, and extensive cleaning of exam room while observing appropriate contact time as indicated for disinfecting solutions.    No follow-ups on file.  Orders Placed This Encounter  Procedures   MM Digital Screening    Standing Status:   Future    Standing Expiration Date:   05/07/2022    Order Specific Question:   Reason for Exam (SYMPTOM  OR DIAGNOSIS REQUIRED)    Answer:   screening  mammogram    Order Specific Question:   Preferred imaging location?    Answer:   [facility]   DG Bone Density    Standing Status:   Future    Standing Expiration Date:   05/07/2022    Scheduling Instructions:     Please schedule with mammogram    Order Specific Question:   Reason for Exam (SYMPTOM  OR DIAGNOSIS REQUIRED)    Answer:   ovarian failure/ osteoporosis    Order Specific Question:   Preferred imaging location?    Answer:   [facility]   CBC with Differential/Platelet    Standing Status:   Future    Standing Expiration Date:   05/07/2022   Comp Met (CMET)    Standing Status:   Future    Standing Expiration Date:   05/07/2022   Lipid panel    Standing Status:   Future    Standing Expiration Date:   05/07/2022   TSH    Standing Status:   Future    Standing Expiration Date:   05/07/2022   Hepatitis C Antibody    Standing Status:   Future    Standing Expiration Date:   05/07/2022    Requested Prescriptions    No prescriptions requested or ordered in this encounter

## 2021-05-13 ENCOUNTER — Other Ambulatory Visit (INDEPENDENT_AMBULATORY_CARE_PROVIDER_SITE_OTHER): Payer: No Typology Code available for payment source

## 2021-05-13 DIAGNOSIS — Z0000 Encounter for general adult medical examination without abnormal findings: Secondary | ICD-10-CM

## 2021-05-13 DIAGNOSIS — Z13220 Encounter for screening for lipoid disorders: Secondary | ICD-10-CM | POA: Diagnosis not present

## 2021-05-13 DIAGNOSIS — Z1159 Encounter for screening for other viral diseases: Secondary | ICD-10-CM | POA: Diagnosis not present

## 2021-05-13 LAB — TSH: TSH: 1.33 u[IU]/mL (ref 0.35–5.50)

## 2021-05-13 LAB — LIPID PANEL
Cholesterol: 203 mg/dL — ABNORMAL HIGH (ref 0–200)
HDL: 49.4 mg/dL (ref >39.00)
LDL Cholesterol: 132 mg/dL — ABNORMAL HIGH (ref 0–99)
NonHDL: 153.79
Total CHOL/HDL Ratio: 4
Triglycerides: 107 mg/dL (ref 0.0–149.0)
VLDL: 21.4 mg/dL (ref 0.0–40.0)

## 2021-05-13 LAB — CBC WITH DIFFERENTIAL/PLATELET
Basophils Absolute: 0.1 10*3/uL (ref 0.0–0.1)
Basophils Relative: 1.4 % (ref 0.0–3.0)
Eosinophils Absolute: 0.2 10*3/uL (ref 0.0–0.7)
Eosinophils Relative: 4.1 % (ref 0.0–5.0)
HCT: 39.7 % (ref 36.0–46.0)
Hemoglobin: 13.3 g/dL (ref 12.0–15.0)
Lymphocytes Relative: 33.8 % (ref 12.0–46.0)
Lymphs Abs: 1.6 10*3/uL (ref 0.7–4.0)
MCHC: 33.6 g/dL (ref 30.0–36.0)
MCV: 93 fl (ref 78.0–100.0)
Monocytes Absolute: 0.4 10*3/uL (ref 0.1–1.0)
Monocytes Relative: 8.4 % (ref 3.0–12.0)
Neutro Abs: 2.4 10*3/uL (ref 1.4–7.7)
Neutrophils Relative %: 52.3 % (ref 43.0–77.0)
Platelets: 239 10*3/uL (ref 150.0–400.0)
RBC: 4.27 Mil/uL (ref 3.87–5.11)
RDW: 12.6 % (ref 11.5–15.5)
WBC: 4.6 10*3/uL (ref 4.0–10.5)

## 2021-05-13 LAB — COMPREHENSIVE METABOLIC PANEL
ALT: 21 U/L (ref 0–35)
AST: 20 U/L (ref 0–37)
Albumin: 4.4 g/dL (ref 3.5–5.2)
Alkaline Phosphatase: 50 U/L (ref 39–117)
BUN: 22 mg/dL (ref 6–23)
CO2: 30 mEq/L (ref 19–32)
Calcium: 9.4 mg/dL (ref 8.4–10.5)
Chloride: 106 mEq/L (ref 96–112)
Creatinine, Ser: 1.04 mg/dL (ref 0.40–1.20)
GFR: 56.98 mL/min — ABNORMAL LOW (ref >60.00)
Glucose, Bld: 98 mg/dL (ref 70–99)
Potassium: 4.7 mEq/L (ref 3.5–5.1)
Sodium: 142 mEq/L (ref 135–145)
Total Bilirubin: 0.7 mg/dL (ref 0.2–1.2)
Total Protein: 6.4 g/dL (ref 6.0–8.3)

## 2021-05-13 LAB — HEPATITIS C ANTIBODY
Hepatitis C Ab: NONREACTIVE
SIGNAL TO CUT-OFF: <0.02 (ref <1.00)

## 2021-05-14 ENCOUNTER — Encounter: Payer: Self-pay | Admitting: Family

## 2021-05-25 NOTE — Telephone Encounter (Signed)
Spoke with the patient. She agrees to planning for her colonoscopy 07/13/21 arrive at 8:30 am. Pre-visit by phone 06/25/21 at 8:00 am.

## 2021-05-26 ENCOUNTER — Ambulatory Visit
Admission: RE | Admit: 2021-05-26 | Discharge: 2021-05-26 | Disposition: A | Discharge status: Home / Self Care | Payer: No Typology Code available for payment source | Source: Ambulatory Visit | Attending: Family | Admitting: Family

## 2021-05-26 DIAGNOSIS — Z1231 Encounter for screening mammogram for malignant neoplasm of breast: Secondary | ICD-10-CM

## 2021-05-26 IMAGING — MG MM DIGITAL SCREENING BILAT W/ TOMO AND CAD
8 series · 8 of 24 positions shown · non-contrast
Comparison: Previous exam(s).

CLINICAL DATA: Screening. *

EXAM:
DIGITAL SCREENING BILATERAL MAMMOGRAM WITH TOMOSYNTHESIS AND CAD
TECHNIQUE: Bilateral screening digital craniocaudal and mediolateral oblique
mammograms were obtained. Bilateral screening digital breast
tomosynthesis was performed. The images were evaluated with
computer-aided detection.

[R MLO synth-2D]
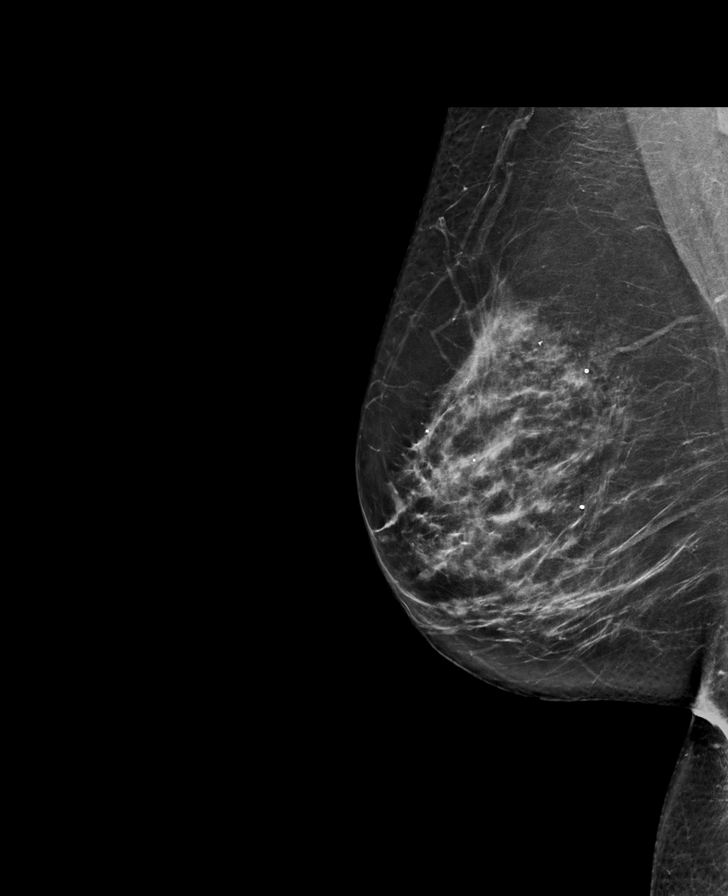

[R CC synth-2D]
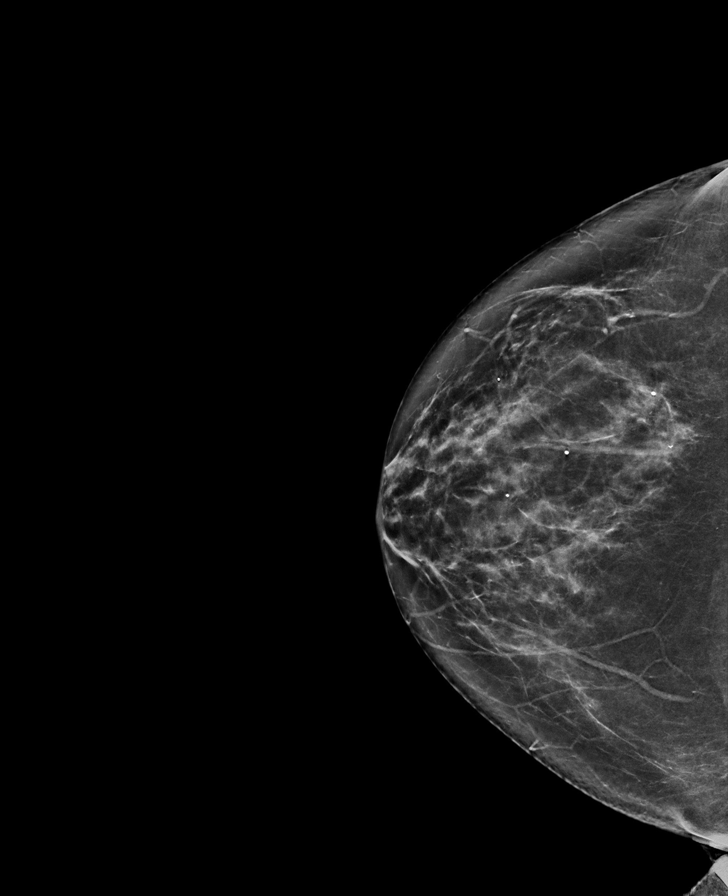

[L MLO synth-2D]
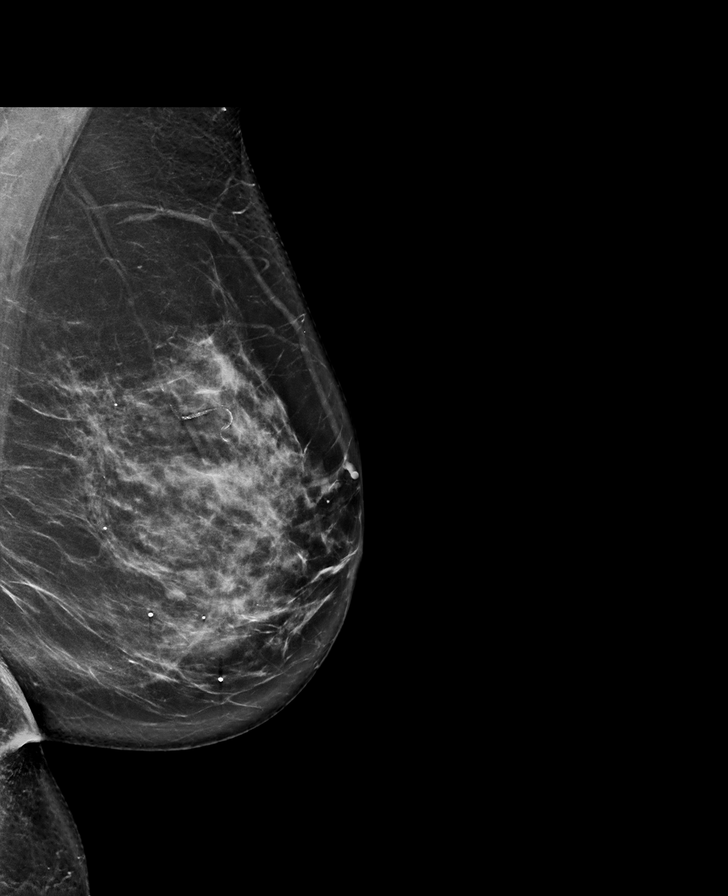

[L CC synth-2D]
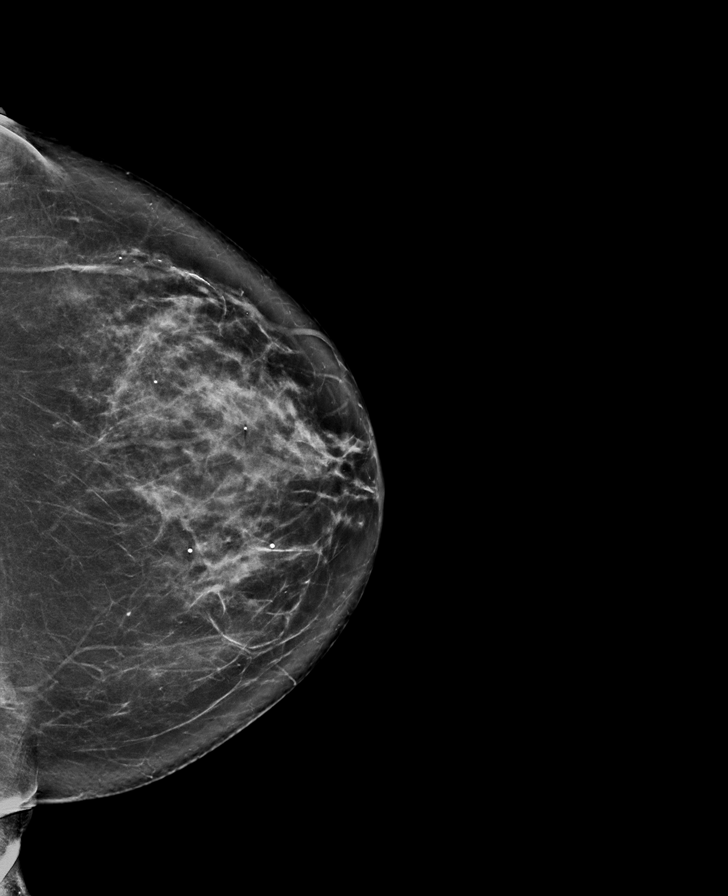

[L CC tomo · tomo slice 41/81.0]
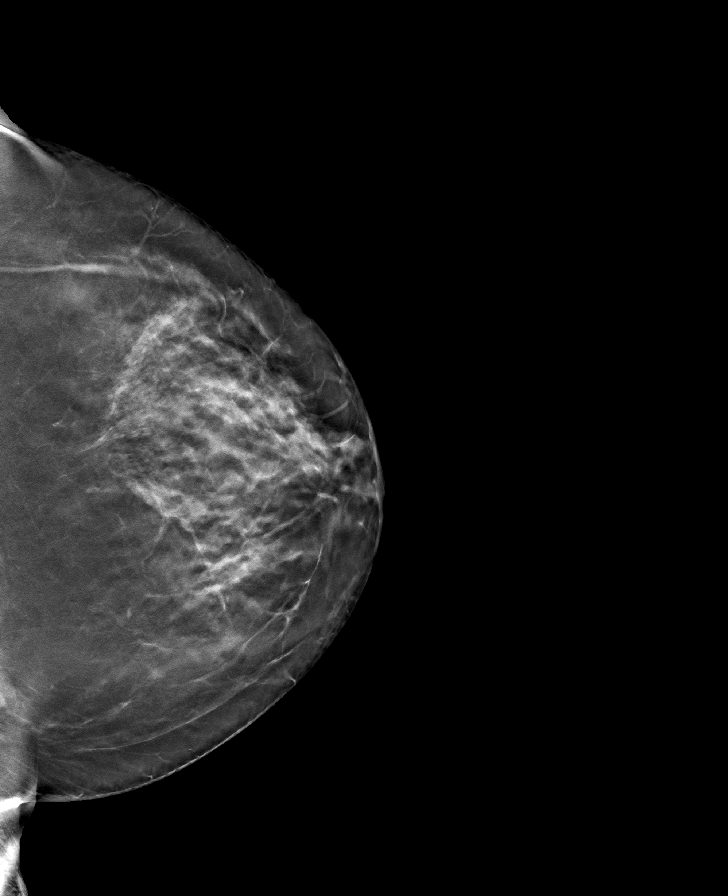

[L MLO tomo · tomo slice 43/85.0]
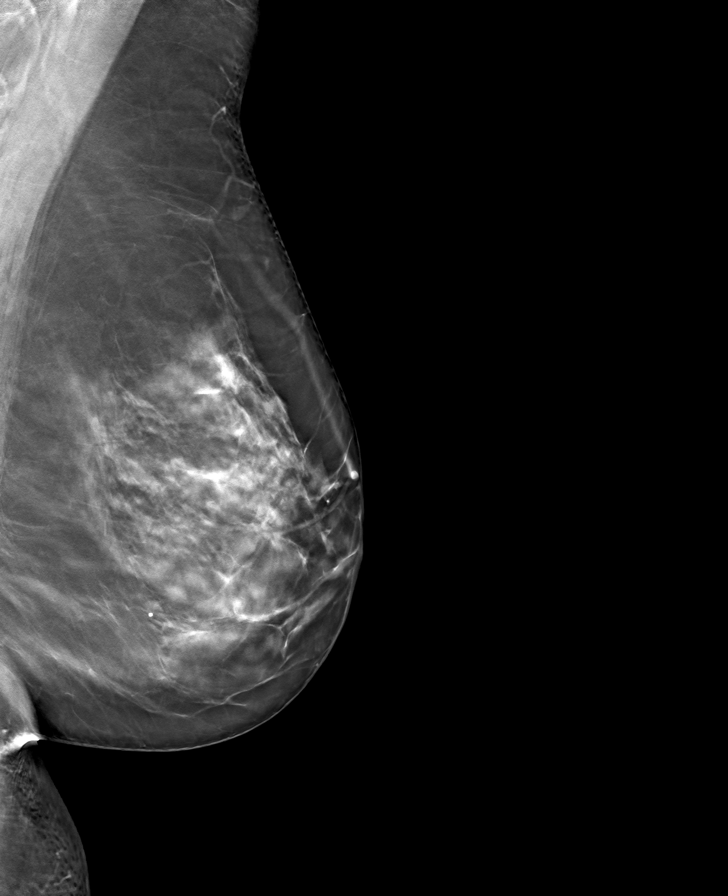

[R MLO tomo · tomo slice 41/81.0]
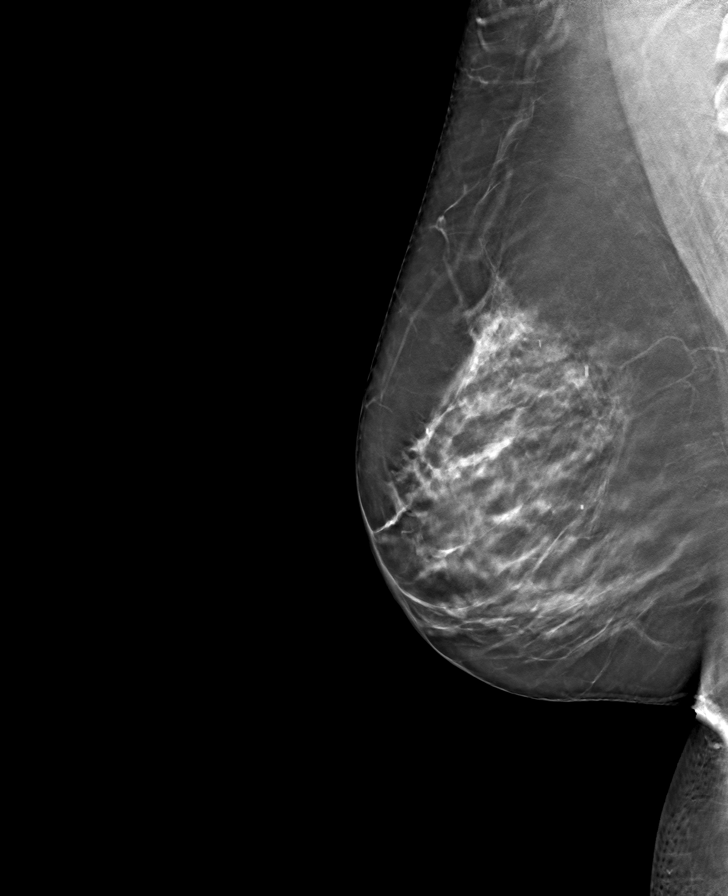

[R CC tomo · tomo slice 39/76.0]
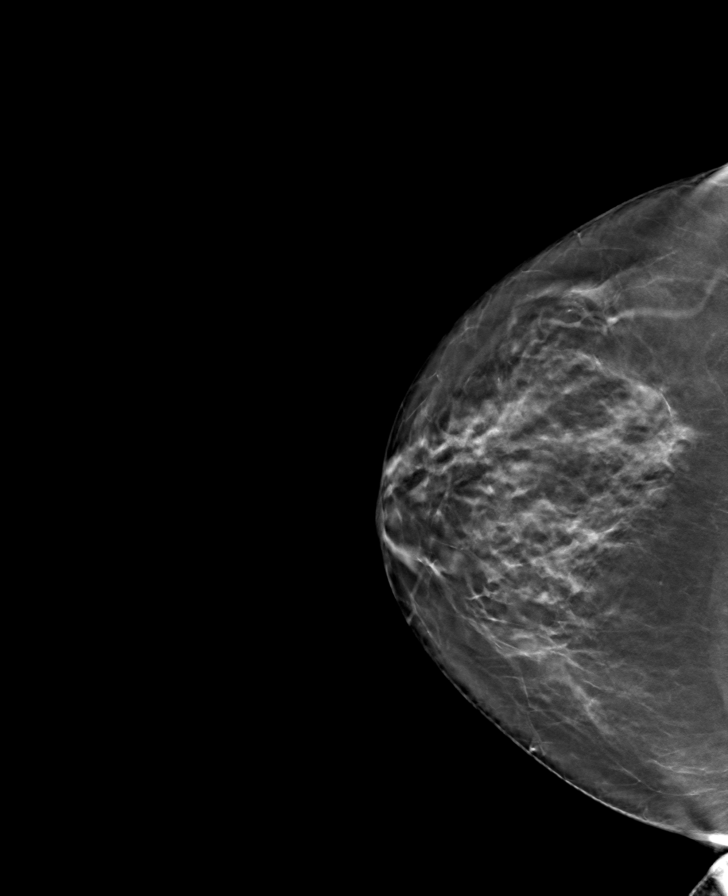

[8 of 24 positions shown; findings below may reference images not displayed]

ACR Breast Density Category c: The breast tissue is heterogeneously
dense, which may obscure small masses.
FINDINGS: There are no findings suspicious for malignancy.
IMPRESSION: No mammographic evidence of malignancy. A result letter of this
screening mammogram will be mailed directly to the patient.

RECOMMENDATION:
Screening mammogram in one year. (Code:[OX])

BI-RADS CATEGORY  1: Negative.

## 2021-06-04 ENCOUNTER — Telehealth: Payer: Self-pay | Admitting: Cardiology

## 2021-06-04 MED ORDER — CARVEDILOL 6.25 MG PO TABS: 3.1250 mg | ORAL_TABLET | Freq: Two times a day (BID) | ORAL | 3 refills | Status: DC

## 2021-06-04 NOTE — Telephone Encounter (Signed)
°*  STAT* If patient is at the pharmacy, call can be transferred to refill team.   1. Which medications need to be refilled? (please list name of each medication and dose if known)  carvedilol (COREG) 6.25 MG tablet  2. Which pharmacy/location (including street and city if local pharmacy) is medication to be sent to? [id number] - [place], [place]  3. Do they need a 30 day or 90 day supply? 90 day   Patient has 3-4 days left

## 2021-06-25 ENCOUNTER — Other Ambulatory Visit: Payer: Self-pay

## 2021-06-25 ENCOUNTER — Ambulatory Visit (AMBULATORY_SURGERY_CENTER): Payer: No Typology Code available for payment source

## 2021-06-25 VITALS — Ht 66.0 in | Wt 174.0 lb

## 2021-06-25 DIAGNOSIS — Z86010 Personal history of colonic polyps: Secondary | ICD-10-CM

## 2021-06-25 MED ORDER — NA SULFATE-K SULFATE-MG SULF 17.5-3.13-1.6 GM/177ML PO SOLN: 1.0000 | Freq: Once | ORAL | 0 refills | Status: AC

## 2021-06-25 NOTE — Progress Notes (Signed)
Denies allergies to eggs or soy products. Denies complication of anesthesia or sedation. Denies use of weight loss medication. Denies use of O2.   Emmi instructions given for colonoscopy.  

## 2021-06-28 ENCOUNTER — Telehealth: Payer: Self-pay | Admitting: Gastroenterology

## 2021-06-28 NOTE — Telephone Encounter (Signed)
Patient called and stated her insurance would not cover it and it would cost her $115.  She wants to know if there is an alternative that would be less expensive and she would like you to call her before you call it into the pharmacy to discuss it.  Thank you. ?

## 2021-06-28 NOTE — Telephone Encounter (Signed)
Patient will try using [employer] app for a coupon discount for the suprep. Offered Golytely-Pt did not want to use the Golytely.  ?

## 2021-07-12 ENCOUNTER — Encounter: Payer: Self-pay | Admitting: Gastroenterology

## 2021-07-13 ENCOUNTER — Ambulatory Visit (AMBULATORY_SURGERY_CENTER): Payer: No Typology Code available for payment source | Admitting: Gastroenterology

## 2021-07-13 ENCOUNTER — Encounter: Payer: Self-pay | Admitting: Gastroenterology

## 2021-07-13 VITALS — BP 103/62 | HR 72 | Temp 97.1°F | Resp 18 | Ht 66.0 in | Wt 174.0 lb

## 2021-07-13 DIAGNOSIS — K648 Other hemorrhoids: Secondary | ICD-10-CM

## 2021-07-13 DIAGNOSIS — D120 Benign neoplasm of cecum: Secondary | ICD-10-CM

## 2021-07-13 DIAGNOSIS — K5792 Diverticulitis of intestine, part unspecified, without perforation or abscess without bleeding: Secondary | ICD-10-CM

## 2021-07-13 MED ORDER — SODIUM CHLORIDE 0.9 % IV SOLN: 500.0000 mL | INTRAVENOUS | Status: DC

## 2021-07-13 NOTE — Progress Notes (Signed)
Pt's states no medical or surgical changes since previsit or office visit. 

## 2021-07-13 NOTE — Progress Notes (Signed)
VSS, transported to PACU °

## 2021-07-13 NOTE — Patient Instructions (Signed)
? ? ?HANDOUTS ON POLYPS ,DIVERTICULOSIS,& HEMORRHOIDS GIVEN TO YOU TODAY  ? ?AWAIT PATHOLOGY RESULTS ON POLYPS REMOVED  ? ? ?YOU HAD AN ENDOSCOPIC PROCEDURE TODAY AT [facility]:   Refer to the procedure report that was given to you for any specific questions about what was found during the examination.  If the procedure report does not answer your questions, please call your gastroenterologist to clarify.  If you requested that your care partner not be given the details of your procedure findings, then the procedure report has been included in a sealed envelope for you to review at your convenience later. ? ?YOU SHOULD EXPECT: Some feelings of bloating in the abdomen. Passage of more gas than usual.  Walking can help get rid of the air that was put into your GI tract during the procedure and reduce the bloating. If you had a lower endoscopy (such as a colonoscopy or flexible sigmoidoscopy) you may notice spotting of blood in your stool or on the toilet paper. If you underwent a bowel prep for your procedure, you may not have a normal bowel movement for a few days. ? ?Please Note:  You might notice some irritation and congestion in your nose or some drainage.  This is from the oxygen used during your procedure.  There is no need for concern and it should clear up in a day or so. ? ?SYMPTOMS TO REPORT IMMEDIATELY: ? ?Following lower endoscopy (colonoscopy or flexible sigmoidoscopy): ? Excessive amounts of blood in the stool ? Significant tenderness or worsening of abdominal pains ? Swelling of the abdomen that is new, acute ? Fever of 100?F or higher ? ? ?For urgent or emergent issues, a gastroenterologist can be reached at any hour by calling [phone]. ?Do not use MyChart messaging for urgent concerns.  ? ? ?DIET:  We do recommend a small meal at first, but then you may proceed to your regular diet.  Drink plenty of fluids but you should avoid alcoholic beverages for 24 hours. ? ?ACTIVITY:   You should plan to take it easy for the rest of today and you should NOT DRIVE or use heavy machinery until tomorrow (because of the sedation medicines used during the test).   ? ?FOLLOW UP: ?Our staff will call the number listed on your records 48-72 hours following your procedure to check on you and address any questions or concerns that you may have regarding the information given to you following your procedure. If we do not reach you, we will leave a message.  We will attempt to reach you two times.  During this call, we will ask if you have developed any symptoms of COVID 19. If you develop any symptoms (ie: fever, flu-like symptoms, shortness of breath, cough etc.) before then, please call [phone].  If you test positive for Covid 19 in the 2 weeks post procedure, please call and report this information to [place].   ? ?If any biopsies were taken you will be contacted by phone or by letter within the next 1-3 weeks.  Please call us at [phone] if you have not heard about the biopsies in 3 weeks.  ? ? ?SIGNATURES/CONFIDENTIALITY: ?You and/or your care partner have signed paperwork which will be entered into your electronic medical record.  These signatures attest to the fact that that the information above on your After Visit Summary has been reviewed and is understood.  Full responsibility of the confidentiality of this discharge information lies with you  and/or your care-partner.  ? ? ? ?

## 2021-07-13 NOTE — Op Note (Signed)
[place] ?Patient Name: Samantha Mckinney ?Procedure Date: 07/13/2021 9:39 AM ?MRN: [id number] ?Endoscopist: Carlota Raspberry. Havery Moros , MD ?Age: 66 ?Referring MD:  ?Date of Birth: 12/10/1955 ?Gender: Female ?Account #: [id number] ?Procedure:                Colonoscopy ?Indications:              Follow-up of multiple episodes of sigmoid  ?                          diverticulitis, history of polyps - 2 adenomas  ?                          12/2017 ?Medicines:                Monitored Anesthesia Care ?Procedure:                Pre-Anesthesia Assessment: ?                          - Prior to the procedure, a History and Physical  ?                          was performed, and patient medications and  ?                          allergies were reviewed. The patient's tolerance of  ?                          previous anesthesia was also reviewed. The risks  ?                          and benefits of the procedure and the sedation  ?                          options and risks were discussed with the patient.  ?                          All questions were answered, and informed consent  ?                          was obtained. Prior Anticoagulants: The patient has  ?                          taken no previous anticoagulant or antiplatelet  ?                          agents. ASA Grade Assessment: II - A patient with  ?                          mild systemic disease. After reviewing the risks  ?                          and benefits, the patient was deemed in  ?  satisfactory condition to undergo the procedure. ?                          After obtaining informed consent, the colonoscope  ?                          was passed under direct vision. Throughout the  ?                          procedure, the patient's blood pressure, pulse, and  ?                          oxygen saturations were monitored continuously. The  ?                          Olympus Scope [id number] was introduced through the  ?                           anus and advanced to the the cecum, identified by  ?                          appendiceal orifice and ileocecal valve. The  ?                          colonoscopy was performed without difficulty. The  ?                          patient tolerated the procedure well. The quality  ?                          of the bowel preparation was good. The ileocecal  ?                          valve, appendiceal orifice, and rectum were  ?                          photographed. ?Scope In: 9:47:28 AM ?Scope Out: 10:11:23 AM ?Scope Withdrawal Time: 0 hours 17 minutes 13 seconds  ?Total Procedure Duration: 0 hours 23 minutes 55 seconds  ?Findings:                 Skin tags were found on perianal exam. ?                          A 3 mm polyp was found in the cecum. The polyp was  ?                          sessile. The polyp was removed with a cold snare.  ?                          Resection and retrieval were complete. ?                          Multiple small-mouthed diverticula were found in  ?  the sigmoid colon. ?                          Internal hemorrhoids were found during retroflexion. ?                          The colon revealed excessive looping. ?                          The exam was otherwise without abnormality. ?Complications:            No immediate complications. Estimated blood loss:  ?                          Minimal. ?Estimated Blood Loss:     Estimated blood loss was minimal. ?Impression:               - Perianal skin tags found on perianal exam. ?                          - One 3 mm polyp in the cecum, removed with a cold  ?                          snare. Resected and retrieved. ?                          - Diverticulosis in the sigmoid colon. ?                          - Internal hemorrhoids. ?                          - There was significant looping of the colon. ?                          - The examination was otherwise normal. ?Recommendation:           -  Patient has a contact number available for  ?                          emergencies. The signs and symptoms of potential  ?                          delayed complications were discussed with the  ?                          patient. Return to normal activities tomorrow.  ?                          Written discharge instructions were provided to the  ?                          patient. ?                          - Resume previous diet. ?                          -  Continue present medications. ?                          - Await pathology results. ?                          - If recurrent diverticulitis continues moving  ?                          forward, consideration for surgical consultation.  ?                          Will discuss with the patient ?Remo Lipps P. Charmain Diosdado, MD ?07/13/2021 10:17:31 AM ?This report has been signed electronically. ?

## 2021-07-13 NOTE — Progress Notes (Signed)
[facility] History and Physical ? ? ?Primary Care Physician:  Marrian Salvage, [place] ? ? ?Reason for Procedure:   Recurrent diverticulitis ? ?Plan:    colonoscopy ? ? ? ? ?HPI: Samantha Mckinney is a 66 y.o. female  here for colonoscopy. Last exam done 12/2017 with some adenomas removed. Was told to repeat in 5 years but she has had multiple episodes of sigmoid diverticulitis since her last exam.  Last episode in January. Otherwise feels well without any cardiopulmonary symptoms. Have discussed risks  benefits and she wants to proceed. ? ? ?Past Medical History:  ?Diagnosis Date  ? Allergy   ? Cataract   ? Colon polyps   ? Diverticulitis   ? Elevated cholesterol   ? Endometriosis 02/02/2017  ? Factor V Leiden ([place]) 02/02/2017  ? GERD (gastroesophageal reflux disease)   ? IBS (irritable bowel syndrome)   ? Liver hemangioma 02/02/2017  ? Low back pain   ? Lumbar radiculopathy   ? NASH (nonalcoholic steatohepatitis) 02/02/2017  ? Scoliosis   ? Spinal stenosis of lumbar region   ? ? ?Past Surgical History:  ?Procedure Laterality Date  ? ABDOMINAL HYSTERECTOMY  1998  ? TOTAL, on estrogen  until 2012  ? BACK SURGERY  2000  ? disc removed L4L5  ? CHOLECYSTECTOMY    ? COLONOSCOPY    ? ESOPHAGOGASTRODUODENOSCOPY  2014  ? In [place]  ? [place]  ? LAPAROSCOPIC ENDOMETRIOSIS FULGURATION    ? x 6, 1980-1991  ? UPPER GASTROINTESTINAL ENDOSCOPY    ? ? ?Prior to Admission medications   ?Medication Sig Start Date End Date Taking? Authorizing Provider  ?B Complex Vitamins (VITAMIN B COMPLEX PO) vitamin B complex   Yes [provider]  ?BIOTIN 5000 PO Take by mouth.   Yes [provider]  ?carvedilol (COREG) 6.25 MG tablet Take 0.5 tablets (3.125 mg total) by mouth 2 (two) times daily with a meal. 06/04/21  Yes Leonie Man, MD  ?Cholecalciferol (D3 PO) Take 4,000 mg by mouth daily.   Yes [provider]  ?cyclobenzaprine (FLEXERIL) 10 MG tablet Take 10 mg by mouth 3 (three)  times daily as needed for muscle spasms.   Yes [provider]  ?magnesium oxide (MAG-OX) 400 MG tablet Take 400 mg by mouth daily.   Yes [provider]  ?pantoprazole (PROTONIX) 40 MG tablet Take 1 tablet (40 mg total) by mouth daily. 02/25/21  Yes Marrian Salvage, [place]  ?TURMERIC PO turmeric   Yes [provider]  ?vitamin C (ASCORBIC ACID) 500 MG tablet Take 500 mg by mouth daily.   Yes [provider]  ?zinc gluconate 50 MG tablet Take 50 mg by mouth daily.    [provider]  ? ? ?Current Outpatient Medications  ?Medication Sig Dispense Refill  ? B Complex Vitamins (VITAMIN B COMPLEX PO) vitamin B complex    ? BIOTIN 5000 PO Take by mouth.    ? carvedilol (COREG) 6.25 MG tablet Take 0.5 tablets (3.125 mg total) by mouth 2 (two) times daily with a meal. 90 tablet 3  ? Cholecalciferol (D3 PO) Take 4,000 mg by mouth daily.    ? cyclobenzaprine (FLEXERIL) 10 MG tablet Take 10 mg by mouth 3 (three) times daily as needed for muscle spasms.    ? magnesium oxide (MAG-OX) 400 MG tablet Take 400 mg by mouth daily.    ? pantoprazole (PROTONIX) 40 MG tablet Take 1 tablet (40 mg total) by mouth daily.  90 tablet 1  ? TURMERIC PO turmeric    ? vitamin C (ASCORBIC ACID) 500 MG tablet Take 500 mg by mouth daily.    ? zinc gluconate 50 MG tablet Take 50 mg by mouth daily.    ? ?Current Facility-Administered Medications  ?Medication Dose Route Frequency Provider Last Rate Last Admin  ? 0.9 %  sodium chloride infusion  500 mL Intravenous Continuous Haseeb Fiallos, Carlota Raspberry, MD      ? ? ?Allergies as of 07/13/2021 - Review Complete 07/13/2021  ?Allergen Reaction Noted  ? Morphine and related Swelling 02/02/2017  ? Other Other (See Comments) 03/09/2020  ? ? ?Family History  ?Problem Relation Age of Onset  ? Cirrhosis Mother   ? Diabetes Mother   ? Arthritis Mother   ? Gout Mother   ? Depression Mother   ? Bowel Disease Mother   ? Heart disease Father   ? Diabetes Father   ? Heart  attack Father 47  ?     during knee surgery  ? Valvular heart disease Sister   ?     had valve surgery  ? Depression Sister   ? Diabetes Brother   ? Factor V Leiden deficiency Brother   ?     with blood clots  ? Hyperlipidemia Son   ? Cancer Maternal Grandmother 85  ?     colon cancer  ? Colon cancer Maternal Grandmother   ? Atrial fibrillation Brother   ? Valvular heart disease Brother   ?     valve surgery  ? Hypertension Brother   ? Esophageal cancer Neg Hx   ? Rectal cancer Neg Hx   ? Stomach cancer Neg Hx   ? ? ?Social History  ? ?Socioeconomic History  ? Marital status: Married  ?  Spouse name: Gershon Mussel  ? Number of children: 1  ? Years of education: Not on file  ? Highest education level: High school graduate  ?Occupational History  ?  Employer: [employer]  ?  Comment: Environmental health practitioner  ?Tobacco Use  ? Smoking status: Never  ? Smokeless tobacco: Never  ?Vaping Use  ? Vaping Use: Never used  ?Substance and Sexual Activity  ? Alcohol use: Yes  ?  Comment: OCC   ? Drug use: Never  ? Sexual activity: Not Currently  ?  Partners: Male  ?  Comment: 1st intercourse- 20, partners- 21, married- 5 yrs   ?Other Topics Concern  ? Not on file  ?Social History Narrative  ? Lives with husband  ? Caffeine- coffee 1-2 a day  ? Not able to exercise 2/2 back pain  ? ?Social Determinants of Health  ? ?Financial Resource Strain: Not on file  ?Food Insecurity: Not on file  ?Transportation Needs: Not on file  ?Physical Activity: Not on file  ?Stress: Not on file  ?Social Connections: Not on file  ?Intimate Partner Violence: Not on file  ? ? ?Review of Systems: ?All other review of systems negative except as mentioned in the HPI. ? ?Physical Exam: ?Vital signs ?BP 126/75   Pulse 72   Temp (!) 97.1 ?F (36.2 ?C) (Temporal)   Ht 5' 6"  (1.676 m)   Wt 174 lb (78.9 kg)   SpO2 97%   BMI 28.08 kg/m?  ? ?General:   Alert,  Well-developed, pleasant and cooperative in NAD ?Lungs:  Clear throughout to auscultation.   ?Heart:   Regular rate and rhythm ?Abdomen:  Soft, nontender and nondistended.   ?Neuro/Psych:  Alert and cooperative. Normal mood and affect. A and O x 3 ? ?Jolly Mango, MD ?[facility] ? ? ?

## 2021-07-15 ENCOUNTER — Telehealth: Payer: Self-pay

## 2021-07-15 NOTE — Telephone Encounter (Signed)
?  Follow up Call- ? ? ?  07/13/2021  ?  9:07 AM 11/22/2019  ?  7:14 AM  ?Call back number  ?Post procedure Call Back phone  # [phone]  ?Permission to leave phone message Yes Yes  ?  ?First follow up call, unable to leave a message ? ? ?

## 2021-07-15 NOTE — Telephone Encounter (Signed)
?  Follow up Call- ? ? ?  07/13/2021  ?  9:07 AM 11/22/2019  ?  7:14 AM  ?Call back number  ?Post procedure Call Back phone  # [phone]  ?Permission to leave phone message Yes Yes  ?  ? ?Patient questions: ? ?Do you have a fever, pain , or abdominal swelling? No. ?Pain Score  0 * ? ?Have you tolerated food without any problems? Yes.   ? ?Have you been able to return to your normal activities? Yes.   ? ?Do you have any questions about your discharge instructions: ?Diet   No. ?Medications  No. ?Follow up visit  No. ? ?Do you have questions or concerns about your Care? No. ? ?Actions: ?* If pain score is 4 or above: ?No action needed, pain <4. ? ? ?

## 2021-08-05 ENCOUNTER — Ambulatory Visit: Payer: No Typology Code available for payment source | Admitting: Family

## 2021-08-05 ENCOUNTER — Encounter: Payer: Self-pay | Admitting: Family

## 2021-08-05 VITALS — BP 140/80 | HR 74 | Temp 97.8°F | Ht 66.0 in | Wt 180.4 lb

## 2021-08-05 DIAGNOSIS — M79662 Pain in left lower leg: Secondary | ICD-10-CM

## 2021-08-05 DIAGNOSIS — R42 Dizziness and giddiness: Secondary | ICD-10-CM

## 2021-08-05 DIAGNOSIS — M7989 Other specified soft tissue disorders: Secondary | ICD-10-CM

## 2021-08-05 MED ORDER — MECLIZINE HCL 25 MG PO TABS: 25.0000 mg | ORAL_TABLET | Freq: Three times a day (TID) | ORAL | 0 refills | Status: DC | PRN

## 2021-08-05 NOTE — Progress Notes (Signed)
?Samantha Mckinney is a 66 y.o. female with the following history as recorded in EpicCare:  ?Patient Active Problem List  ? Diagnosis Date Noted  ? Essential hypertension 03/20/2021  ? Paroxysmal tachycardia ([place]) 01/07/2020  ? Low back pain 07/16/2019  ? Lung nodule 02/02/2017  ? Factor V Leiden ([place]) 02/02/2017  ? Mixed hyperlipidemia 02/02/2017  ? 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 Vitamin D deficiency 02/02/2017  ? Endometriosis 02/02/2017  ? Abnormal liver CT 02/02/2017  ? Hiatal hernia 02/02/2017  ? CKD (chronic kidney disease) 02/02/2017  ? NASH (nonalcoholic steatohepatitis) 02/02/2017  ?  ?Current Outpatient Medications  ?Medication Sig Dispense Refill  ? B Complex Vitamins (VITAMIN B COMPLEX PO) vitamin B complex    ? BIOTIN 5000 PO Take by mouth.    ? carvedilol (COREG) 6.25 MG tablet Take 0.5 tablets (3.125 mg total) by mouth 2 (two) times daily with a meal. 90 tablet 3  ? Cholecalciferol (D3 PO) Take 4,000 mg by mouth daily.    ? cyclobenzaprine (FLEXERIL) 10 MG tablet Take 10 mg by mouth 3 (three) times daily as needed for muscle spasms.    ? magnesium oxide (MAG-OX) 400 MG tablet Take 400 mg by mouth daily.    ? meclizine (ANTIVERT) 25 MG tablet Take 1 tablet (25 mg total) by mouth 3 (three) times daily as needed for dizziness. 30 tablet 0  ? pantoprazole (PROTONIX) 40 MG tablet Take 1 tablet (40 mg total) by mouth daily. 90 tablet 1  ? TURMERIC PO turmeric    ? vitamin C (ASCORBIC ACID) 500 MG tablet Take 500 mg by mouth daily.    ? zinc gluconate 50 MG tablet Take 50 mg by mouth daily.    ? ?No current facility-administered medications for this visit.  ?  ?Allergies: Morphine and related and Other  ?Past Medical History:  ?Diagnosis Date  ? Allergy   ? Cataract   ? Colon polyps   ? Diverticulitis   ? Elevated cholesterol   ? Endometriosis 02/02/2017  ? Factor V Leiden ([place]) 02/02/2017  ? GERD (gastroesophageal reflux disease)   ? IBS (irritable bowel syndrome)   ? Liver hemangioma 02/02/2017  ? Low  back pain   ? Lumbar radiculopathy   ? NASH (nonalcoholic steatohepatitis) 02/02/2017  ? Scoliosis   ? Spinal stenosis of lumbar region   ?  ?Past Surgical History:  ?Procedure Laterality Date  ? ABDOMINAL HYSTERECTOMY  1998  ? TOTAL, on estrogen  until 2012  ? BACK SURGERY  2000  ? disc removed L4L5  ? CHOLECYSTECTOMY    ? COLONOSCOPY    ? ESOPHAGOGASTRODUODENOSCOPY  2014  ? In [place]  ? [place]  ? LAPAROSCOPIC ENDOMETRIOSIS FULGURATION    ? x 6, 1980-1991  ? UPPER GASTROINTESTINAL ENDOSCOPY    ?  ?Family History  ?Problem Relation Age of Onset  ? Cirrhosis Mother   ? Diabetes Mother   ? Arthritis Mother   ? Gout Mother   ? Depression Mother   ? Bowel Disease Mother   ? Heart disease Father   ? Diabetes Father   ? Heart attack Father 73  ?     during knee surgery  ? Valvular heart disease Sister   ?     had valve surgery  ? Depression Sister   ? Diabetes Brother   ? Factor V Leiden deficiency Brother   ?     with blood clots  ? Hyperlipidemia Son   ? Cancer Maternal  Grandmother 86  ?     colon cancer  ? Colon cancer Maternal Grandmother   ? Atrial fibrillation Brother   ? Valvular heart disease Brother   ?     valve surgery  ? Hypertension Brother   ? Esophageal cancer Neg Hx   ? Rectal cancer Neg Hx   ? Stomach cancer Neg Hx   ?  ?Social History  ? ?Tobacco Use  ? Smoking status: Never  ? Smokeless tobacco: Never  ?Substance Use Topics  ? Alcohol use: Yes  ?  Comment: OCC   ?  ?Subjective:  ?Patient presents with concerns for left shin pain x 3 months; notes that has already discussed with her orthopedist and vascular specialist; ultrasound of lower extremity was done by orthopedist in the past month that showed fatty tissue changes/ no solid mass; did have vascular ultrasound in January 2023; is prescribed compression stockings daily x 8 hours/ admits that is due for new pair;  ? ?Also mentions that she had an episode of "what I think was vertigo" when she first woke up this morning; notes that  room was spinning and she had to lie down; no prior history of vertigo; no numbness/ tingling or vision changes; symptoms did resolve in time to allow her to go to work; no chest pain or shortness of breath when episode occurred;  ? ? ? ? ?Objective:  ?Vitals:  ? 08/05/21 1407  ?BP: 140/80  ?Pulse: 74  ?Temp: 97.8 ?F (36.6 ?C)  ?TempSrc: Oral  ?SpO2: 97%  ?Weight: 180 lb 6.4 oz (81.8 kg)  ?Height: 5' 6"  (1.676 m)  ?  ?General: Well developed, well nourished, in no acute distress  ?Skin : Warm and dry.  ?Head: Normocephalic and atraumatic  ?Eyes: Sclera and conjunctiva clear; pupils round and reactive to light; extraocular movements intact  ?Ears: External normal; canals clear; tympanic membranes normal  ?Oropharynx: Pink, supple. No suspicious lesions  ?Neck: Supple without thyromegaly, adenopathy  ?Lungs: Respirations unlabored; clear to auscultation bilaterally without wheeze, rales, rhonchi  ?CVS exam: normal rate and regular rhythm.  ?Musculoskeletal: No deformities; no active joint inflammation  ?Extremities: No edema, cyanosis, clubbing; mild area of swelling noted over front of left lower leg ?Vessels: Symmetric bilaterally  ?Neurologic: Alert and oriented; speech intact; face symmetrical; moves all extremities well; CNII-XII intact without focal deficit  ? ?Assessment:  ?1. Pain and swelling of left lower leg   ?2. Vertigo   ?  ?Plan:  ?Suspect related to chronic venous insufficiency; reviewed vascular ultrasound from earlier this year; she will update her compression stockings; she will also talk to her physical therapist to see if any treatment could be applied to help manage the symptoms; follow up as needed; ?Physical exam is reassuring; symptoms consistent with episode of BPPV; Rx for Antivert 25 mg tid prn; follow up if symptoms return and will need to consider further testing;  ? ?This visit occurred during the SARS-CoV-2 public health emergency.  Safety protocols were in place, including screening  questions prior to the visit, additional usage of staff PPE, and extensive cleaning of exam room while observing appropriate contact time as indicated for disinfecting solutions.  ? ? ?No follow-ups on file.  ?Orders Placed This Encounter  ?Procedures  ? Ambulatory referral to Physical Therapy  ?  Referral Priority:   Routine  ?  Referral Type:   Physical Medicine  ?  Referral Reason:   Specialty Services Required  ?  Requested Specialty:  Physical Therapy  ?  Number of Visits Requested:   1  ?  ?Requested Prescriptions  ? ?Signed Prescriptions Disp Refills  ? meclizine (ANTIVERT) 25 MG tablet 30 tablet 0  ?  Sig: Take 1 tablet (25 mg total) by mouth 3 (three) times daily as needed for dizziness.  ?  ? ?

## 2021-08-07 ENCOUNTER — Encounter: Payer: Self-pay | Admitting: Family

## 2021-08-09 ENCOUNTER — Other Ambulatory Visit: Payer: Self-pay | Admitting: Family

## 2021-08-09 DIAGNOSIS — E663 Overweight: Secondary | ICD-10-CM

## 2021-08-13 ENCOUNTER — Encounter: Payer: Self-pay | Admitting: Family

## 2021-08-13 ENCOUNTER — Ambulatory Visit: Payer: No Typology Code available for payment source | Admitting: Family

## 2021-08-13 VITALS — BP 110/78 | HR 69 | Temp 98.0°F | Ht 66.0 in | Wt 179.8 lb

## 2021-08-13 DIAGNOSIS — M79662 Pain in left lower leg: Secondary | ICD-10-CM | POA: Diagnosis not present

## 2021-08-13 DIAGNOSIS — M7989 Other specified soft tissue disorders: Secondary | ICD-10-CM | POA: Diagnosis not present

## 2021-08-13 NOTE — Progress Notes (Signed)
?Samantha Mckinney is a 66 y.o. female with the following history as recorded in EpicCare:  ?Patient Active Problem List  ? Diagnosis Date Noted  ? Essential hypertension 03/20/2021  ? Paroxysmal tachycardia ([place]) 01/07/2020  ? Low back pain 07/16/2019  ? Lung nodule 02/02/2017  ? Factor V Leiden ([place]) 02/02/2017  ? Mixed hyperlipidemia 02/02/2017  ? 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 Vitamin D deficiency 02/02/2017  ? Endometriosis 02/02/2017  ? Abnormal liver CT 02/02/2017  ? Hiatal hernia 02/02/2017  ? CKD (chronic kidney disease) 02/02/2017  ? NASH (nonalcoholic steatohepatitis) 02/02/2017  ?  ?Current Outpatient Medications  ?Medication Sig Dispense Refill  ? B Complex Vitamins (VITAMIN B COMPLEX PO) vitamin B complex    ? BIOTIN 5000 PO Take by mouth.    ? carvedilol (COREG) 6.25 MG tablet Take 0.5 tablets (3.125 mg total) by mouth 2 (two) times daily with a meal. 90 tablet 3  ? Cholecalciferol (D3 PO) Take 4,000 mg by mouth daily.    ? cyclobenzaprine (FLEXERIL) 10 MG tablet Take 10 mg by mouth 3 (three) times daily as needed for muscle spasms.    ? magnesium oxide (MAG-OX) 400 MG tablet Take 400 mg by mouth daily.    ? meclizine (ANTIVERT) 25 MG tablet Take 1 tablet (25 mg total) by mouth 3 (three) times daily as needed for dizziness. 30 tablet 0  ? pantoprazole (PROTONIX) 40 MG tablet Take 1 tablet (40 mg total) by mouth daily. 90 tablet 1  ? TURMERIC PO turmeric    ? vitamin C (ASCORBIC ACID) 500 MG tablet Take 500 mg by mouth daily.    ? zinc gluconate 50 MG tablet Take 50 mg by mouth daily.    ? ?No current facility-administered medications for this visit.  ?  ?Allergies: Morphine and related and Other  ?Past Medical History:  ?Diagnosis Date  ? Allergy   ? Cataract   ? Colon polyps   ? Diverticulitis   ? Elevated cholesterol   ? Endometriosis 02/02/2017  ? Factor V Leiden ([place]) 02/02/2017  ? GERD (gastroesophageal reflux disease)   ? IBS (irritable bowel syndrome)   ? Liver hemangioma 02/02/2017  ? Low  back pain   ? Lumbar radiculopathy   ? NASH (nonalcoholic steatohepatitis) 02/02/2017  ? Scoliosis   ? Spinal stenosis of lumbar region   ?  ?Past Surgical History:  ?Procedure Laterality Date  ? ABDOMINAL HYSTERECTOMY  1998  ? TOTAL, on estrogen  until 2012  ? BACK SURGERY  2000  ? disc removed L4L5  ? CHOLECYSTECTOMY    ? COLONOSCOPY    ? ESOPHAGOGASTRODUODENOSCOPY  2014  ? In [place]  ? [place]  ? LAPAROSCOPIC ENDOMETRIOSIS FULGURATION    ? x 6, 1980-1991  ? UPPER GASTROINTESTINAL ENDOSCOPY    ?  ?Family History  ?Problem Relation Age of Onset  ? Cirrhosis Mother   ? Diabetes Mother   ? Arthritis Mother   ? Gout Mother   ? Depression Mother   ? Bowel Disease Mother   ? Heart disease Father   ? Diabetes Father   ? Heart attack Father 64  ?     during knee surgery  ? Valvular heart disease Sister   ?     had valve surgery  ? Depression Sister   ? Diabetes Brother   ? Factor V Leiden deficiency Brother   ?     with blood clots  ? Hyperlipidemia Son   ? Cancer Maternal  Grandmother 59  ?     colon cancer  ? Colon cancer Maternal Grandmother   ? Atrial fibrillation Brother   ? Valvular heart disease Brother   ?     valve surgery  ? Hypertension Brother   ? Esophageal cancer Neg Hx   ? Rectal cancer Neg Hx   ? Stomach cancer Neg Hx   ?  ?Social History  ? ?Tobacco Use  ? Smoking status: Never  ? Smokeless tobacco: Never  ?Substance Use Topics  ? Alcohol use: Yes  ?  Comment: OCC   ?  ?Subjective:  ? ?Further discussion about treatment options for left leg; seen last week and discussed that most likely vascular source of symptoms; has tried one session of PT but limited benefit and PT suggested further evaluation; patient unable to see her vascular provider until the end of June; wanted to talk about alternatives in the interim; concerned about cost of MRI though; did have vascular ultrasound earlier this year.  ? ? ? ? ?Objective:  ?Vitals:  ? 08/13/21 0914  ?BP: 110/78  ?Pulse: 69  ?Temp: 98 ?F (36.7 ?C)   ?TempSrc: Oral  ?SpO2: 99%  ?Weight: 179 lb 12.8 oz (81.6 kg)  ?Height: 5' 6"  (1.676 m)  ?  ?General: Well developed, well nourished, in no acute distress  ?Skin : Warm and dry.  ?Head: Normocephalic and atraumatic  ?Lungs: Respirations unlabored;  ?Extremities: localized area of edema noted again at lateral upper left leg; no  cyanosis, clubbing  ?Vessels: Symmetric bilaterally  ?Neurologic: Alert and oriented; speech intact; face symmetrical; moves all extremities well; CNII-XII intact without focal deficit  ? ?Assessment:  ?1. Pain and swelling of left lower leg   ?  ?Plan:  ?Still suspect vascular source; patient hesitant to schedule for MRI due to costs which is understandable; she will reach out to vascular specialist to see if she can be seen sooner; follow up as needed after speaking to vascular group.  ? ? ?No follow-ups on file.  ?No orders of the defined types were placed in this encounter. ?  ?Requested Prescriptions  ? ? No prescriptions requested or ordered in this encounter  ?  ? ?

## 2021-09-01 ENCOUNTER — Encounter: Payer: Self-pay | Admitting: Family

## 2021-09-03 ENCOUNTER — Other Ambulatory Visit: Payer: Self-pay | Admitting: Family

## 2021-09-03 DIAGNOSIS — M7989 Other specified soft tissue disorders: Secondary | ICD-10-CM

## 2021-09-06 ENCOUNTER — Telehealth: Payer: Self-pay | Admitting: Family

## 2021-09-06 DIAGNOSIS — M7989 Other specified soft tissue disorders: Secondary | ICD-10-CM

## 2021-09-06 DIAGNOSIS — M79662 Pain in left lower leg: Secondary | ICD-10-CM

## 2021-09-06 NOTE — Telephone Encounter (Signed)
Caller: Higinio Roger [facility]   Referral was sent for imaging rep needs to speak with [facility] regarding changing orders.   Please reach [place] at her direct tele: [phone] extension [phone]

## 2021-09-08 NOTE — Telephone Encounter (Signed)
Old order states "w/wo" Imaging says it needs to be one or the other. Please advise. -Dx: M79.98. LM suspected Etiology to be vascular.

## 2021-09-08 NOTE — Telephone Encounter (Signed)
Called number x2. No answer and VM box full.

## 2021-09-08 NOTE — Addendum Note (Signed)
Addended by: Darreld Mclean on: 09/08/2021 07:38 PM   Modules accepted: Orders

## 2021-09-09 NOTE — Telephone Encounter (Signed)
[facility] states order is still incorrect and would like for someone to follow up..   [phone] extension [phone]

## 2021-09-09 NOTE — Telephone Encounter (Signed)
No answer/ vm full.  Will try call back later

## 2021-09-14 NOTE — Addendum Note (Signed)
Addended by: Kem Boroughs D on: 09/14/2021 11:59 AM   Modules accepted: Orders

## 2021-09-14 NOTE — Addendum Note (Signed)
Addended by: Kem Boroughs D on: 09/14/2021 11:45 AM   Modules accepted: Orders

## 2021-09-14 NOTE — Telephone Encounter (Signed)
Spoke with [facility] and they need it to say in the comments where on the leg (ex: upper/lower).  Added the comment on the order of left lower leg between knee and ankle.  Also advised patient to call back to scheduled.

## 2021-09-16 ENCOUNTER — Encounter: Payer: Self-pay | Admitting: Family Medicine

## 2021-09-16 ENCOUNTER — Ambulatory Visit: Payer: No Typology Code available for payment source | Admitting: Family Medicine

## 2021-09-16 VITALS — BP 118/82 | HR 79 | Temp 98.3°F | Resp 18 | Ht 66.0 in | Wt 176.6 lb

## 2021-09-16 DIAGNOSIS — R21 Rash and other nonspecific skin eruption: Secondary | ICD-10-CM | POA: Insufficient documentation

## 2021-09-16 MED ORDER — CLOTRIMAZOLE-BETAMETHASONE 1-0.05 % EX CREA: 1.0000 "application " | TOPICAL_CREAM | Freq: Two times a day (BID) | CUTANEOUS | 0 refills | Status: DC

## 2021-09-16 NOTE — Progress Notes (Signed)
Subjective:   By signing my name below, I, Carylon Perches, attest that this documentation has been prepared under the direction and in the presence of Ann Held DO 09/16/2021   Patient ID: Samantha Mckinney, female    DOB: 1955-08-22, 66 y.o.   MRN: [id number]  Chief Complaint  Patient presents with   Dry Skin    Pt states having redness and itchiness around her lips and eyes.     HPI Patient is in today for office visit.  Patient is complaining of a worsening rash that started 08/16/2021. She reports that it is dry and scaly around left eyelid and lips area. There are associated symptoms of severe redness, burning, and dryness around the left eye that she is most concerned with. There are also associated symptoms of itching, scaling, and tingling around lips area. She denies changing make up products or applying make up around effected area. She uses an anti-fungal OTC cream that has been improving the symptoms. She prefers to continue to use a cream-based treatment for her symptoms.  Patient is scheduled to see a dermatologist on the 10/14/2021    Health Maintenance Due  Topic Date Due   HIV Screening  Never done   Zoster Vaccines- Shingrix (1 of 2) Never done    Past Medical History:  Diagnosis Date   Allergy    Cataract    Colon polyps    Diverticulitis    Elevated cholesterol    Endometriosis 02/02/2017   Factor V Leiden ([place]) 02/02/2017   GERD (gastroesophageal reflux disease)    IBS (irritable bowel syndrome)    Liver hemangioma 02/02/2017   Low back pain    Lumbar radiculopathy    NASH (nonalcoholic steatohepatitis) 02/02/2017   Scoliosis    Spinal stenosis of lumbar region     Past Surgical History:  Procedure Laterality Date   ABDOMINAL HYSTERECTOMY  1998   TOTAL, on estrogen  until 2012   BACK SURGERY  2000   disc removed L4L5   CHOLECYSTECTOMY     COLONOSCOPY     ESOPHAGOGASTRODUODENOSCOPY  2014   In [place]   HEMORRHOID SURGERY  1996    LAPAROSCOPIC ENDOMETRIOSIS FULGURATION     x 6, 1980-1991   UPPER GASTROINTESTINAL ENDOSCOPY      Family History  Problem Relation Age of Onset   Cirrhosis Mother    Diabetes Mother    Arthritis Mother    Gout Mother    Depression Mother    Bowel Disease Mother    Heart disease Father    Diabetes Father    Heart attack Father 24       during knee surgery   Valvular heart disease Sister        had valve surgery   Depression Sister    Diabetes Brother    Factor V Leiden deficiency Brother        with blood clots   Hyperlipidemia Son    Cancer Maternal Grandmother 41       colon cancer   Colon cancer Maternal Grandmother    Atrial fibrillation Brother    Valvular heart disease Brother        valve surgery   Hypertension Brother    Esophageal cancer Neg Hx    Rectal cancer Neg Hx    Stomach cancer Neg Hx     Social History   Socioeconomic History   Marital status: Married    Spouse name: Tom   Number of children:  1   Years of education: Not on file   Highest education level: High school graduate  Occupational History    Employer: [employer]    Comment: Environmental health practitioner  Tobacco Use   Smoking status: Never   Smokeless tobacco: Never  Vaping Use   Vaping Use: Never used  Substance and Sexual Activity   Alcohol use: Yes    Comment: OCC    Drug use: Never   Sexual activity: Not Currently    Partners: Male    Comment: 1st intercourse- 64, partners- 71, married- 69 yrs   Other Topics Concern   Not on file  Social History Narrative   Lives with husband   Caffeine- coffee 1-2 a day   Not able to exercise 2/2 back pain   Social Determinants of Health   Financial Resource Strain: Not on file  Food Insecurity: Not on file  Transportation Needs: Not on file  Physical Activity: Not on file  Stress: Not on file  Social Connections: Not on file  Intimate Partner Violence: Not on file    Outpatient Medications Prior to Visit  Medication Sig  Dispense Refill   B Complex Vitamins (VITAMIN B COMPLEX PO) vitamin B complex     BIOTIN 5000 PO Take by mouth.     carvedilol (COREG) 6.25 MG tablet Take 0.5 tablets (3.125 mg total) by mouth 2 (two) times daily with a meal. 90 tablet 3   Cholecalciferol (D3 PO) Take 4,000 mg by mouth daily.     cyclobenzaprine (FLEXERIL) 10 MG tablet Take 10 mg by mouth 3 (three) times daily as needed for muscle spasms.     magnesium oxide (MAG-OX) 400 MG tablet Take 400 mg by mouth daily.     meclizine (ANTIVERT) 25 MG tablet Take 1 tablet (25 mg total) by mouth 3 (three) times daily as needed for dizziness. 30 tablet 0   pantoprazole (PROTONIX) 40 MG tablet Take 1 tablet (40 mg total) by mouth daily. 90 tablet 1   TURMERIC PO turmeric     vitamin C (ASCORBIC ACID) 500 MG tablet Take 500 mg by mouth daily.     zinc gluconate 50 MG tablet Take 50 mg by mouth daily.     No facility-administered medications prior to visit.    Allergies  Allergen Reactions   Morphine And Related Swelling   Other Other (See Comments)    Contrast dye allergy - N/V/D, swelling    Review of Systems  Constitutional:  Negative for fever and malaise/fatigue.  HENT:  Negative for congestion.   Eyes:  Negative for blurred vision.  Respiratory:  Negative for cough and shortness of breath.   Cardiovascular:  Negative for chest pain, palpitations and leg swelling.  Gastrointestinal:  Negative for vomiting.  Musculoskeletal:  Negative for back pain.  Skin:  Positive for itching (Lip Area) and rash (Left Eye and Lips).       (+) Redness Around Left Eye  (+) Burning and Tingling around Lip Area (+) Scaling Lip Area  Neurological:  Negative for loss of consciousness and headaches.      Objective:    Physical Exam Vitals and nursing note reviewed.  Constitutional:      General: She is not in acute distress.    Appearance: Normal appearance. She is not ill-appearing.  HENT:     Head: Normocephalic and atraumatic.      Right Ear: External ear normal.     Left Ear: External ear normal.  Eyes:  Extraocular Movements: Extraocular movements intact.     Pupils: Pupils are equal, round, and reactive to light.  Cardiovascular:     Rate and Rhythm: Normal rate and regular rhythm.     Heart sounds: Normal heart sounds. No murmur heard.   No gallop.  Pulmonary:     Effort: Pulmonary effort is normal. No respiratory distress.     Breath sounds: No wheezing or rales.  Skin:    General: Skin is warm and dry.     Findings: Rash present.     Comments: Dry skin around eyes and mouth  Neurological:     Mental Status: She is alert and oriented to person, place, and time.  Psychiatric:        Judgment: Judgment normal.    BP 118/82 (BP Location: Left Arm, Patient Position: Sitting, Cuff Size: Normal)   Pulse 79   Temp 98.3 F (36.8 C) (Oral)   Resp 18   Ht 5' 6"  (1.676 m)   Wt 176 lb 9.6 oz (80.1 kg)   SpO2 98%   BMI 28.50 kg/m  Wt Readings from Last 3 Encounters:  09/16/21 176 lb 9.6 oz (80.1 kg)  08/13/21 179 lb 12.8 oz (81.6 kg)  08/05/21 180 lb 6.4 oz (81.8 kg)       Assessment & Plan:   Problem List Items Addressed This Visit       Unprioritized   Rash - Primary   Relevant Medications   clotrimazole-betamethasone (LOTRISONE) cream   Other Relevant Orders   Ambulatory referral to Dermatology      Meds ordered this encounter  Medications   clotrimazole-betamethasone (LOTRISONE) cream    Sig: Apply 1 application. topically 2 (two) times daily.    Dispense:  30 g    Refill:  0    I, Ann Held, DO, personally preformed the services described in this documentation.  All medical record entries made by the scribe were at my direction and in my presence.  I have reviewed the chart and discharge instructions (if applicable) and agree that the record reflects my personal performance and is accurate and complete. 09/16/2021   I,Amber Collins,acting as a scribe for Ann Held, DO.,have documented all relevant documentation on the behalf of Ann Held, DO,as directed by  Ann Held, DO while in the presence of Ann Held, DO.     Ann Held, DO

## 2021-09-16 NOTE — Patient Instructions (Signed)
Rash, Adult A rash is a change in the color of your skin. A rash can also change the way your skin feels. There are many different conditions and factors that can cause a rash. Some rashes may disappear after a few days, but some may last for a few weeks. Common causes of rashes include: Viral infections, such as: Colds. Measles. Hand, foot, and mouth disease. Bacterial infections, such as: Scarlet fever. Impetigo. Fungal infections, such as Candida. Allergic reactions to food, medicines, or skin care products. Follow these instructions at home: The goal of treatment is to stop the itching and keep the rash from spreading. Pay attention to any changes in your symptoms. Follow these instructions to help with your condition: Medicine Take or apply over-the-counter and prescription medicines only as told by your health care provider. These may include: Corticosteroid creams to treat red or swollen skin. Anti-itch lotions. Oral allergy medicines (antihistamines). Oral corticosteroids for severe symptoms.  Skin care Apply cool compresses to the affected areas. Do not scratch or rub your skin. Avoid covering the rash. Make sure the rash is exposed to air as much as possible. Managing itching and discomfort Avoid hot showers or baths, which can make itching worse. A cold shower may help. Try taking a bath with: Epsom salts. Follow manufacturer instructions on the packaging. You can get these at your local pharmacy or grocery store. Baking soda. Pour a small amount into the bath as told by your health care provider. Colloidal oatmeal. Follow manufacturer instructions on the packaging. You can get this at your local pharmacy or grocery store. Try applying baking soda paste to your skin. Stir water into baking soda until it reaches a paste-like consistency. Try applying calamine lotion. This is an over-the-counter lotion that helps to relieve itchiness. Keep cool and out of the sun. Sweating  and being hot can make itching worse. General instructions  Rest as needed. Drink enough fluid to keep your urine pale yellow. Wear loose-fitting clothing. Avoid scented soaps, detergents, and perfumes. Use gentle soaps, detergents, perfumes, and other cosmetic products. Avoid any substance that causes your rash. Keep a journal to help track what causes your rash. Write down: What you eat. What cosmetic products you use. What you drink. What you wear. This includes jewelry. Keep all follow-up visits as told by your health care provider. This is important. Contact a health care provider if: You sweat at night. You lose weight. You urinate more than normal. You urinate less than normal, or you notice that your urine is a darker color than usual. You feel weak. You vomit. Your skin or the whites of your eyes look yellow (jaundice). Your skin: Tingles. Is numb. Your rash: Does not go away after several days. Gets worse. You are: Unusually thirsty. More tired than normal. You have: New symptoms. Pain in your abdomen. A fever. Diarrhea. Get help right away if you: Have a fever and your symptoms suddenly get worse. Develop confusion. Have a severe headache or a stiff neck. Have severe joint pains or stiffness. Have a seizure. Develop a rash that covers all or most of your body. The rash may or may not be painful. Develop blisters that: Are on top of the rash. Grow larger or grow together. Are painful. Are inside your nose or mouth. Develop a rash that: Looks like purple pinprick-sized spots all over your body. Has a "bull's eye" or looks like a target. Is not related to sun exposure, is red and painful, and causes  your skin to peel. Summary A rash is a change in the color of your skin. Some rashes disappear after a few days, but some may last for a few weeks. The goal of treatment is to stop the itching and keep the rash from spreading. Take or apply over-the-counter  and prescription medicines only as told by your health care provider. Contact a health care provider if you have new or worsening symptoms. Keep all follow-up visits as told by your health care provider. This is important. This information is not intended to replace advice given to you by your health care provider. Make sure you discuss any questions you have with your health care provider. Document Revised: 01/14/2021 Document Reviewed: 01/14/2021 Elsevier Patient Education  [employer].

## 2021-09-21 ENCOUNTER — Telehealth: Payer: Self-pay | Admitting: Family

## 2021-09-21 NOTE — Telephone Encounter (Signed)
[facility] states PA needs to be completed by noon Thurs. Pt appointment is sched for Fri.   Taron from [facility] was advised someone needs to contact pt's ins to review PA [phone]. [facility] (taron) can be reached at [phone]

## 2021-09-24 ENCOUNTER — Ambulatory Visit
Admission: RE | Admit: 2021-09-24 | Discharge: 2021-09-24 | Disposition: A | Discharge status: Home / Self Care | Payer: No Typology Code available for payment source | Source: Ambulatory Visit | Attending: Family | Admitting: Family

## 2021-09-24 ENCOUNTER — Other Ambulatory Visit: Payer: No Typology Code available for payment source

## 2021-09-24 DIAGNOSIS — M79662 Pain in left lower leg: Secondary | ICD-10-CM

## 2021-09-24 DIAGNOSIS — M7989 Other specified soft tissue disorders: Secondary | ICD-10-CM

## 2021-09-24 IMAGING — CT CT TIBIA FIBULA *L* W/O CM
3 of 5 series · 11 of 33 positions shown, 13 images · non-contrast
Comparison: None Available.

CLINICAL DATA: Left lower leg pain and swelling for 4 months. No
known injury. Possible mass.

EXAM:
CT OF THE LOWER LEFT EXTREMITY WITHOUT CONTRAST
TECHNIQUE: Multidetector CT imaging of the lower left extremity was performed
according to the standard protocol.
RADIATION DOSE REDUCTION: This exam was performed according to the
departmental dose-optimization program which includes automated
exposure control, adjustment of the mA and/or kV according to
patient size and/or use of iterative reconstruction technique.

[Series 2: lfov lower extremity 0.60 br40 s3 soft thin · axial · 0.52mm/px · z∈[+630,+844]mm · 3 of 712 slices shown (1 of 2)]
[im 178/712  soft-tissue]
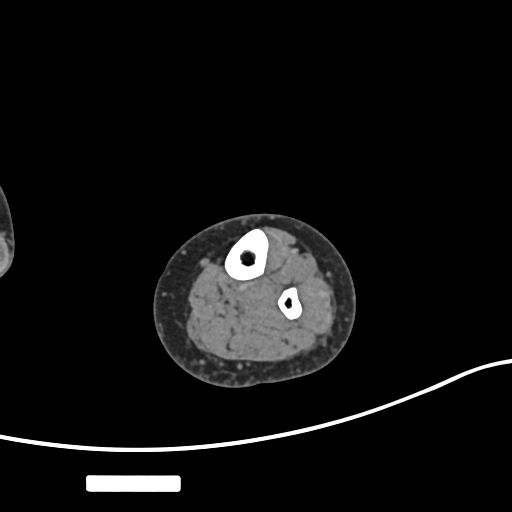
[im 356/712  soft-tissue]
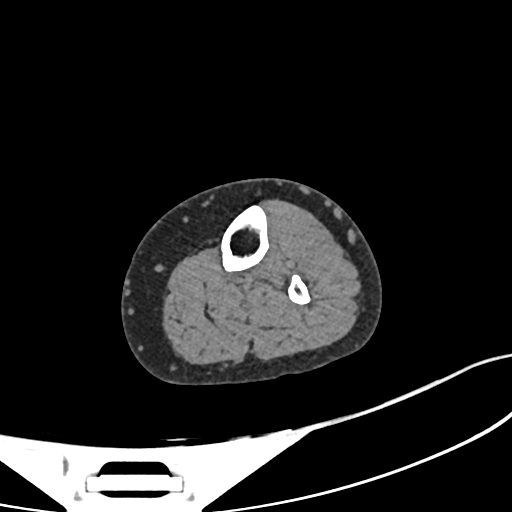
[im 534/712  soft-tissue]
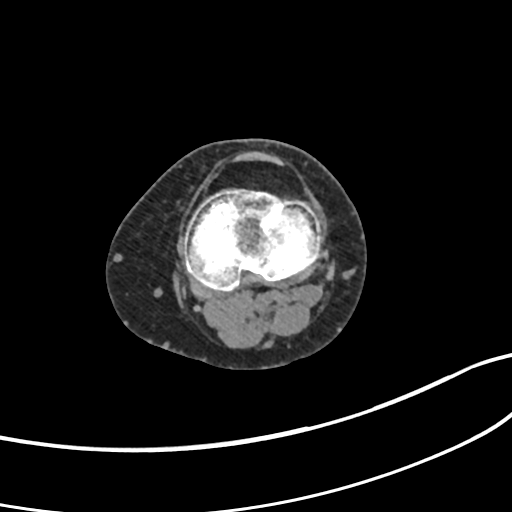

[Series 12: lfov lower extremity 0.60 br40 s3 soft thin · axial · 0.51mm/px · z∈[+476,+865]mm · 5 of 973 slices shown, 7 images (2 of 2)]
[im 163/973  soft-tissue]
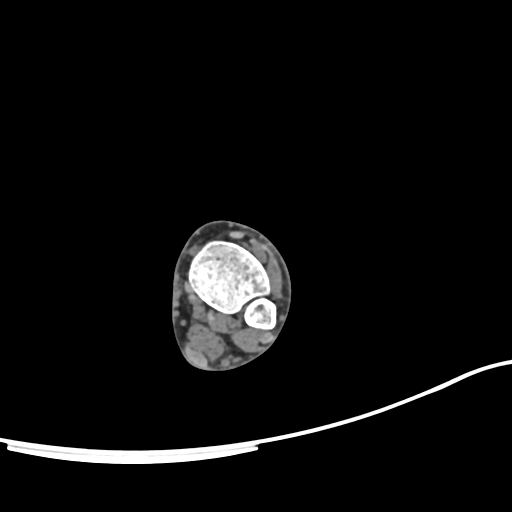
[im 163/973  bone]
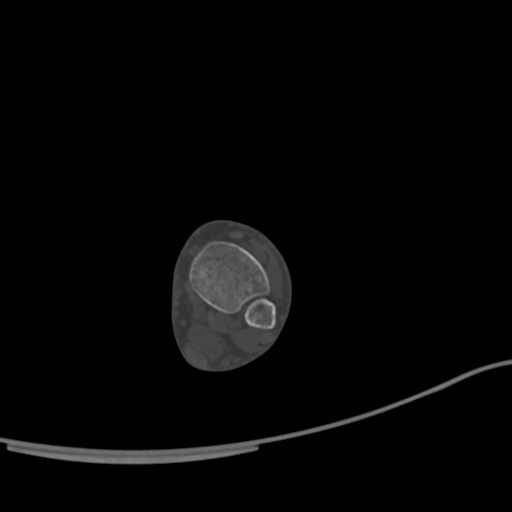
[im 325/973  bone]
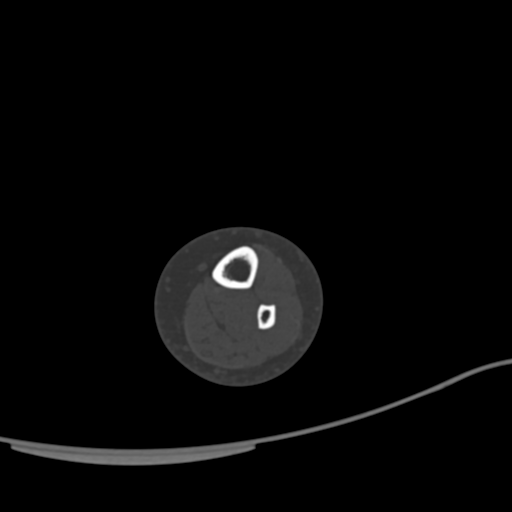
[im 487/973  bone]
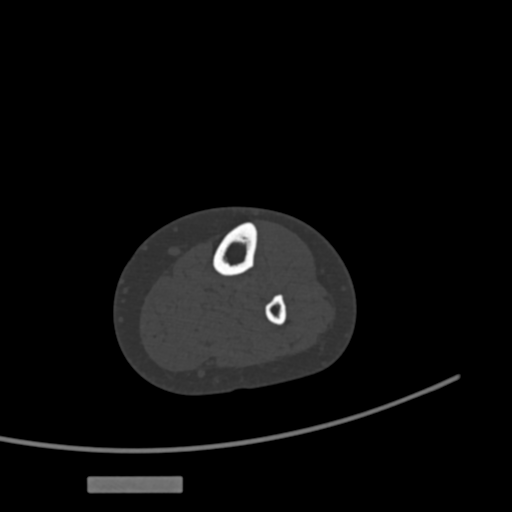
[im 649/973  bone]
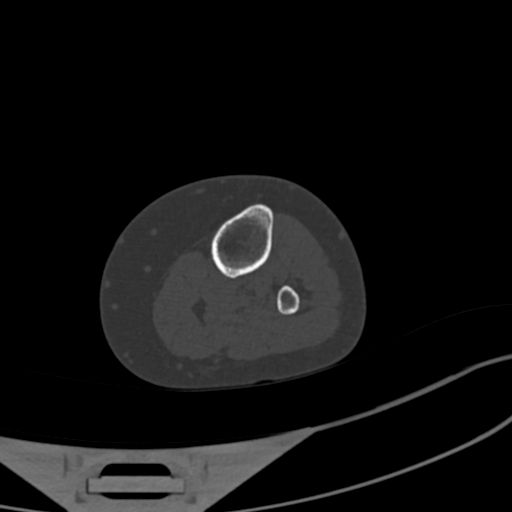
[im 811/973  soft-tissue]
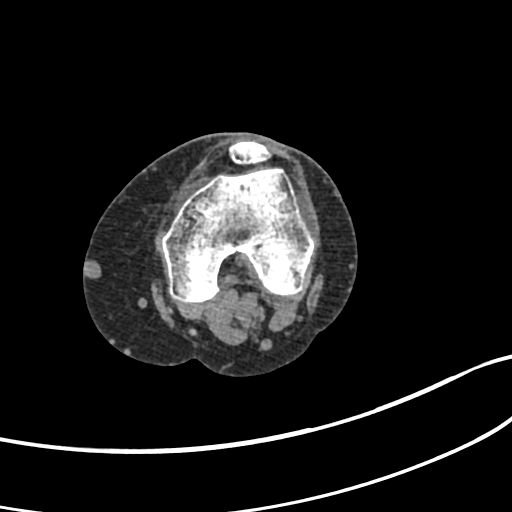
[im 811/973  bone]
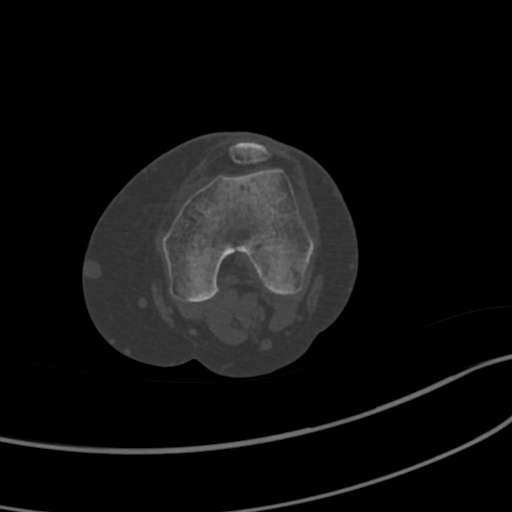

[Series 23: lfov lower extremity 2.00 br40 s3 soft · coronal · 0.37mm/px · 3 of 79 slices shown]
[im 16/79  bone]
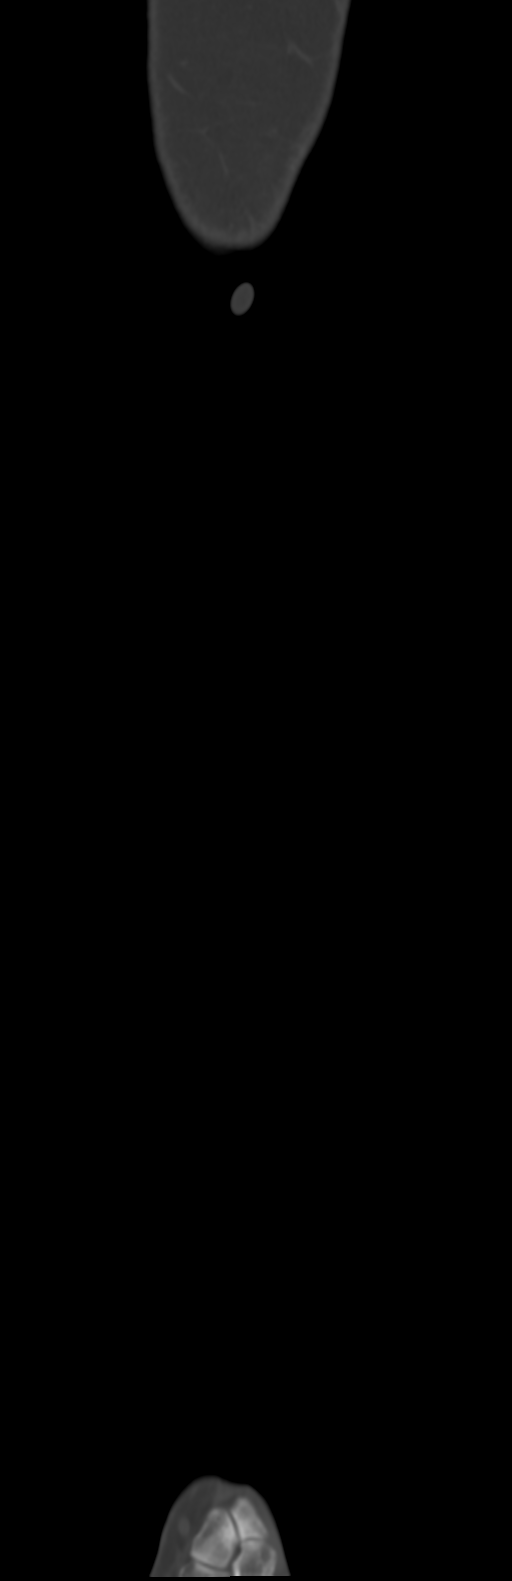
[im 32/79  bone]
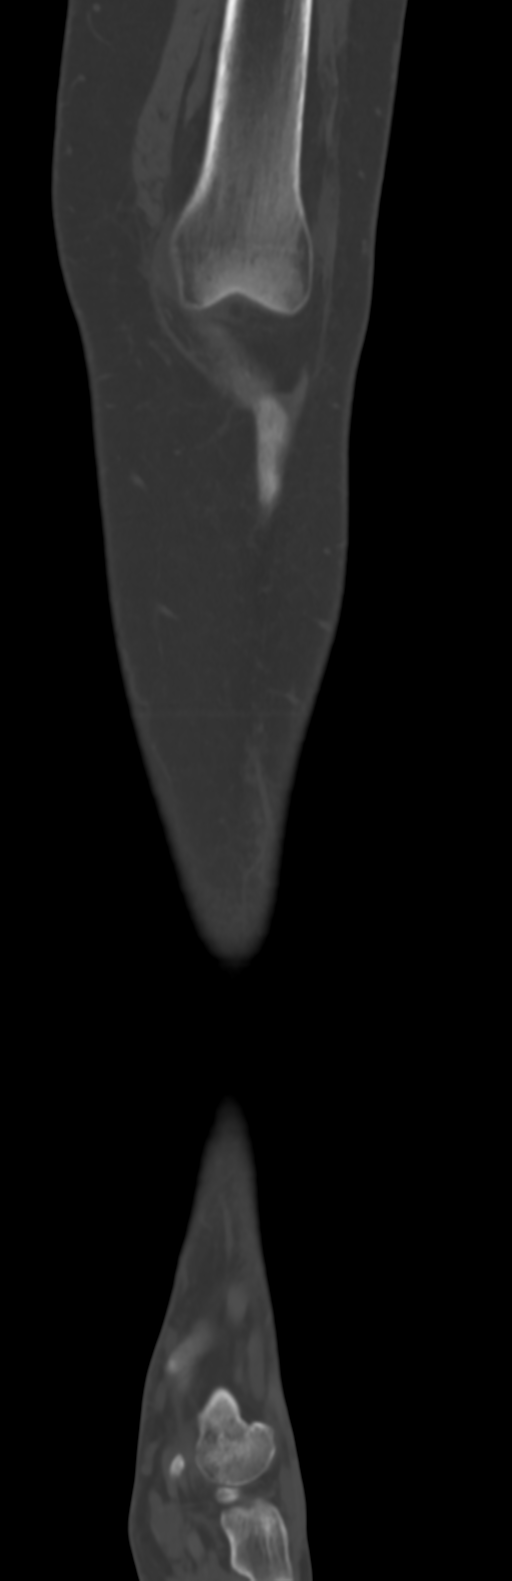
[im 47/79  bone]
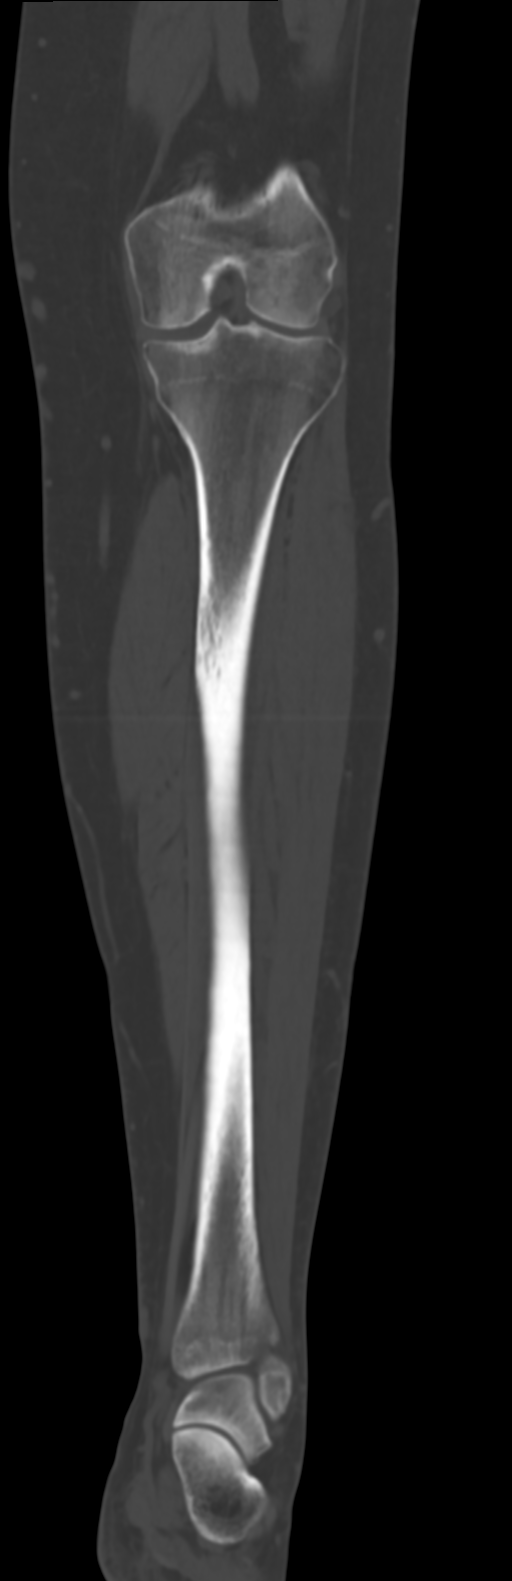

[11 of 33 positions shown; findings below may reference images not displayed]

FINDINGS: Bones/Joint/Cartilage

No focal bony lesion is identified. No fracture or evidence of
stress change is seen. Mild medial compartment joint space narrowing
noted. No knee or ankle effusion.

Ligaments

Suboptimally assessed by CT.

Muscles and Tendons

Appear normal. No atrophy, fluid collection or evidence of tear or
strain.

Soft tissues

A marker is placed over the region of concern just inferior to the
patella. No underlying fluid collection or mass is identified. There
is mild infiltration of subcutaneous fat just below the marker.
IMPRESSION: Negative for fluid collection or mass. Mild infiltration of
subcutaneous fat just inferior to the patella is nonspecific but
could be due to dependent change, contusion or mild bursitis.

## 2021-10-06 ENCOUNTER — Other Ambulatory Visit: Payer: No Typology Code available for payment source

## 2021-11-04 DIAGNOSIS — M25562 Pain in left knee: Secondary | ICD-10-CM | POA: Diagnosis not present

## 2021-11-10 DIAGNOSIS — M2242 Chondromalacia patellae, left knee: Secondary | ICD-10-CM | POA: Diagnosis not present

## 2021-11-10 DIAGNOSIS — M7632 Iliotibial band syndrome, left leg: Secondary | ICD-10-CM | POA: Diagnosis not present

## 2021-11-10 DIAGNOSIS — S83242A Other tear of medial meniscus, current injury, left knee, initial encounter: Secondary | ICD-10-CM | POA: Diagnosis not present

## 2021-12-04 ENCOUNTER — Other Ambulatory Visit: Payer: Self-pay | Admitting: Family

## 2021-12-09 DIAGNOSIS — L710 Perioral dermatitis: Secondary | ICD-10-CM | POA: Diagnosis not present

## 2021-12-09 DIAGNOSIS — L739 Follicular disorder, unspecified: Secondary | ICD-10-CM | POA: Diagnosis not present

## 2021-12-09 DIAGNOSIS — L821 Other seborrheic keratosis: Secondary | ICD-10-CM | POA: Diagnosis not present

## 2021-12-09 DIAGNOSIS — L300 Nummular dermatitis: Secondary | ICD-10-CM | POA: Diagnosis not present

## 2021-12-24 DIAGNOSIS — M545 Low back pain, unspecified: Secondary | ICD-10-CM | POA: Diagnosis not present

## 2022-02-14 ENCOUNTER — Telehealth: Payer: Self-pay | Admitting: Gastroenterology

## 2022-02-14 MED ORDER — AMOXICILLIN-POT CLAVULANATE 875-125 MG PO TABS: 1.0000 | ORAL_TABLET | Freq: Two times a day (BID) | ORAL | 0 refills | Status: AC

## 2022-02-14 NOTE — Addendum Note (Signed)
Addended by: Yevette Edwards on: 02/14/2022 12:27 PM   Modules accepted: Orders

## 2022-02-14 NOTE — Telephone Encounter (Signed)
Inbound call from patient stating she is having a diverticulitis flare up. Patient states she might have an infection and would like something to be sent into the pharmacy. Please advise.  Thank you

## 2022-02-14 NOTE — Telephone Encounter (Signed)
If she thinks symptoms are consistent with prior episodes of diverticulitis then can give her Augmentin twice daily for 1 week.  If any worsening she should let us know.  Thanks

## 2022-02-14 NOTE — Telephone Encounter (Signed)
Called and spoke with patient regarding recommendations. Pt is aware that RX has been sent to pharmacy on file. Pt knows to call us if symptoms do not improve or worsen. Pt verbalized understanding and had no concerns at the end of the call.

## 2022-02-14 NOTE — Telephone Encounter (Signed)
Returned call to patient. She states that she knows she is having a diverticulitis flare up. Symptoms started last Thursday. Symptoms worsened over the weekend, pt had a temp of 100.6 with chills. Pt started a liquid only diet Saturday night and has been on it since. Pt states that she has continued LLQ pain, pain can become sharp during a BM. Pt describes her stools as "slimy" that's how she knows that she has an infection. Pt states that her temp ranged from 99.4-100.0 yesterday. Temp normal at 97.5 today. Pt wanting to know if antibiotics can be sent in for her. Please advise, thanks.

## 2022-02-21 DIAGNOSIS — M1612 Unilateral primary osteoarthritis, left hip: Secondary | ICD-10-CM | POA: Diagnosis not present

## 2022-02-21 DIAGNOSIS — M7062 Trochanteric bursitis, left hip: Secondary | ICD-10-CM | POA: Diagnosis not present

## 2022-02-28 DIAGNOSIS — R233 Spontaneous ecchymoses: Secondary | ICD-10-CM | POA: Diagnosis not present

## 2022-02-28 DIAGNOSIS — L739 Follicular disorder, unspecified: Secondary | ICD-10-CM | POA: Diagnosis not present

## 2022-03-15 ENCOUNTER — Ambulatory Visit: Payer: No Typology Code available for payment source | Admitting: Family

## 2022-03-18 ENCOUNTER — Encounter: Payer: Self-pay | Admitting: Family

## 2022-03-18 ENCOUNTER — Ambulatory Visit (INDEPENDENT_AMBULATORY_CARE_PROVIDER_SITE_OTHER): Payer: BLUE CROSS/BLUE SHIELD

## 2022-03-18 ENCOUNTER — Ambulatory Visit (INDEPENDENT_AMBULATORY_CARE_PROVIDER_SITE_OTHER): Payer: BLUE CROSS/BLUE SHIELD | Admitting: Family

## 2022-03-18 ENCOUNTER — Encounter: Payer: Self-pay | Admitting: Cardiology

## 2022-03-18 ENCOUNTER — Ambulatory Visit: Payer: BLUE CROSS/BLUE SHIELD | Attending: Cardiology | Admitting: Cardiology

## 2022-03-18 VITALS — BP 140/82 | HR 80 | Temp 98.1°F | Resp 18 | Ht 66.0 in | Wt 171.4 lb

## 2022-03-18 VITALS — BP 146/80 | HR 72 | Ht 66.6 in | Wt 171.8 lb

## 2022-03-18 DIAGNOSIS — Z13220 Encounter for screening for lipoid disorders: Secondary | ICD-10-CM | POA: Diagnosis not present

## 2022-03-18 DIAGNOSIS — I479 Paroxysmal tachycardia, unspecified: Secondary | ICD-10-CM | POA: Diagnosis not present

## 2022-03-18 DIAGNOSIS — R079 Chest pain, unspecified: Secondary | ICD-10-CM

## 2022-03-18 DIAGNOSIS — E782 Mixed hyperlipidemia: Secondary | ICD-10-CM

## 2022-03-18 DIAGNOSIS — R109 Unspecified abdominal pain: Secondary | ICD-10-CM | POA: Diagnosis not present

## 2022-03-18 DIAGNOSIS — I10 Essential (primary) hypertension: Secondary | ICD-10-CM

## 2022-03-18 DIAGNOSIS — R1012 Left upper quadrant pain: Secondary | ICD-10-CM

## 2022-03-18 MED ORDER — CARVEDILOL 6.25 MG PO TABS: ORAL_TABLET | ORAL | 3 refills | Status: DC

## 2022-03-18 NOTE — Progress Notes (Unsigned)
Enrolled patient for a 7 day Zio XT monitor to be mailed to patient on 04/25/22

## 2022-03-18 NOTE — Progress Notes (Unsigned)
Primary Care Provider: Marrian Salvage, [place] Cardiologist: Glenetta Hew, MD Electrophysiologist: None  Clinic Note: No chief complaint on file.   ===================================  ASSESSMENT/PLAN   Problem List Items Addressed This Visit   None   ===================================  HPI:    Samantha Mckinney is a 66 y.o. female with a PMH below who presents today for ***. Samantha Mckinney is a 66 y.o. female who is being seen today for the evaluation of *** at the request of Marrian Salvage,*.  Samantha Mckinney was last seen on ***  Recent Hospitalizations: ***  Reviewed  CV studies:    The following studies were reviewed today: (if available, images/films reviewed: From Epic Chart or Care Everywhere) ***:  Interval History:   Theron Arista   CV Review of Symptoms (Summary): Cardiovascular ROS: {roscv:310661}  REVIEWED OF SYSTEMS   ROS  I have reviewed and (if needed) personally updated the patient's problem list, medications, allergies, past medical and surgical history, social and family history.   PAST MEDICAL HISTORY   Past Medical History:  Diagnosis Date   Allergy    Cataract    Colon polyps    Diverticulitis    Elevated cholesterol    Endometriosis 02/02/2017   Factor V Leiden ([place]) 02/02/2017   GERD (gastroesophageal reflux disease)    IBS (irritable bowel syndrome)    Liver hemangioma 02/02/2017   Low back pain    Lumbar radiculopathy    NASH (nonalcoholic steatohepatitis) 02/02/2017   Scoliosis    Spinal stenosis of lumbar region     PAST SURGICAL HISTORY   Past Surgical History:  Procedure Laterality Date   ABDOMINAL HYSTERECTOMY  1998   TOTAL, on estrogen  until 2012   BACK SURGERY  2000   disc removed L4L5   CHOLECYSTECTOMY     COLONOSCOPY     ESOPHAGOGASTRODUODENOSCOPY  2014   In [place]   HEMORRHOID SURGERY  1996   LAPAROSCOPIC ENDOMETRIOSIS FULGURATION     x 6, 1980-1991   UPPER GASTROINTESTINAL  ENDOSCOPY      Immunization History  Administered Date(s) Administered   Tdap 02/21/2018    MEDICATIONS/ALLERGIES   Current Meds  Medication Sig   B Complex Vitamins (VITAMIN B COMPLEX PO) vitamin B complex   BIOTIN 5000 PO Take by mouth.   carvedilol (COREG) 6.25 MG tablet Take 0.5 tablets (3.125 mg total) by mouth 2 (two) times daily with a meal.   Cholecalciferol (D3 PO) Take 4,000 mg by mouth daily.   clotrimazole-betamethasone (LOTRISONE) cream Apply 1 application. topically 2 (two) times daily.   cyclobenzaprine (FLEXERIL) 10 MG tablet Take 10 mg by mouth 3 (three) times daily as needed for muscle spasms.   magnesium oxide (MAG-OX) 400 MG tablet Take 400 mg by mouth daily.   pantoprazole (PROTONIX) 40 MG tablet Take 1 tablet (40 mg total) by mouth daily.   TURMERIC PO turmeric   vitamin C (ASCORBIC ACID) 500 MG tablet Take 500 mg by mouth daily.   zinc gluconate 50 MG tablet Take 50 mg by mouth daily.    Allergies  Allergen Reactions   Morphine And Related Swelling   Other Other (See Comments)    Contrast dye allergy - N/V/D, swelling    SOCIAL HISTORY/FAMILY HISTORY   Reviewed in Epic:  Pertinent findings:  Social History   Tobacco Use   Smoking status: Never   Smokeless tobacco: Never  Vaping Use   Vaping Use: Never used  Substance Use Topics  Alcohol use: Yes    Comment: OCC    Drug use: Never   Social History   Social History Narrative   Lives with husband   Caffeine- coffee 1-2 a day   Not able to exercise 2/2 back pain    OBJCTIVE -PE, EKG, labs   Wt Readings from Last 3 Encounters:  03/18/22 171 lb 12.8 oz (77.9 kg)  09/16/21 176 lb 9.6 oz (80.1 kg)  08/13/21 179 lb 12.8 oz (81.6 kg)    Physical Exam: BP (!) 146/80   Pulse 72   Ht 5' 6.6" (1.692 m)   Wt 171 lb 12.8 oz (77.9 kg)   SpO2 99%   BMI 27.23 kg/m  Physical Exam   Adult ECG Report  Rate: *** ;  Rhythm: {rhythm:17366};   Narrative Interpretation: ***     Recent Labs:   ***  Lab Results  Component Value Date   CHOL 203 (H) 05/13/2021   HDL 49.40 05/13/2021   LDLCALC 132 (H) 05/13/2021   TRIG 107.0 05/13/2021   CHOLHDL 4 05/13/2021   Lab Results  Component Value Date   CREATININE 1.04 05/13/2021   BUN 22 05/13/2021   NA 142 05/13/2021   K 4.7 05/13/2021   CL 106 05/13/2021   CO2 30 05/13/2021      Latest Ref Rng & Units 05/13/2021    7:31 AM 11/28/2020    2:46 PM 01/02/2020    1:01 PM  CBC  WBC 4.0 - 10.5 K/uL 4.6  7.0  5.5   Hemoglobin 12.0 - 15.0 g/dL 13.3  13.7  14.4   Hematocrit 36.0 - 46.0 % 39.7  41.4  42.5   Platelets 150.0 - 400.0 K/uL 239.0  244  255     Lab Results  Component Value Date   HGBA1C 5.6 08/08/2019   Lab Results  Component Value Date   TSH 1.33 05/13/2021    ================================================== I spent a total of ***minutes with the patient spent in direct patient consultation.  Additional time spent with chart review  / charting (studies, outside notes, etc): *** min Total Time: *** min  Current medicines are reviewed at length with the patient today.  (+/- concerns) ***  Notice: This dictation was prepared with Dragon dictation along with smart phrase technology. Any transcriptional errors that result from this process are unintentional and may not be corrected upon review.  Studies Ordered:   No orders of the defined types were placed in this encounter.  No orders of the defined types were placed in this encounter.   Patient Instructions / Medication Changes & Studies & Tests Ordered   There are no Patient Instructions on file for this visit.     Leonie Man, MD, MS Glenetta Hew, M.D., M.S. Interventional Cardiologist  [place]  Pager # [phone] Phone # [phone] [address] [place], [place] [address]   Thank you for choosing [facility]!!

## 2022-03-18 NOTE — Patient Instructions (Addendum)
Medication Instructions:   Increase Carvedilol 6.25 mg in the morning  and 3.125 mg in the evening  *If you need a refill on your cardiac medications before your next appointment, please call your pharmacy*   Lab Work: No labs     Testing/Procedures:  Will be mailed 3 to 7 days the second week of Jan 2024  Your physician has recommended that you wear a holter monitor 7 days  Zio . Holter monitors are medical devices that record the heart's electrical activity. Doctors most often use these monitors to diagnose arrhythmias. Arrhythmias are problems with the speed or rhythm of the heartbeat. The monitor is a small, portable device. You can wear one while you do your normal daily activities. This is usually used to diagnose what is causing palpitations/syncope (passing out).    Follow-Up: At [facility], you and your health needs are our priority.  As part of our continuing mission to provide you with exceptional heart care, we have created designated Provider Care Teams.  These Care Teams include your primary Cardiologist (physician) and Advanced Practice Providers (APPs -  Physician Assistants and Nurse Practitioners) who all work together to provide you with the care you need, when you need it.     Your next appointment:   2 month(s)  The format for your next appointment:   In Person  Provider:   Glenetta Hew, MD    Other Instructions  ZIO XT- Long Term Monitor Instructions  Your physician has requested you wear a ZIO patch monitor for 7 days.  This is a single patch monitor. [employer] supplies one patch monitor per enrollment. Additional stickers are not available. Please do not apply patch if you will be having a Nuclear Stress Test,  Echocardiogram, Cardiac CT, MRI, or Chest Xray during the period you would be wearing the  monitor. The patch cannot be worn during these tests. You cannot remove and re-apply the  ZIO XT patch monitor.  Your ZIO patch monitor will be mailed  3 day USPS to your address on file. It may take 3-5 days  to receive your monitor after you have been enrolled.  Once you have received your monitor, please review the enclosed instructions. Your monitor  has already been registered assigning a specific monitor serial # to you.  Billing and Patient Assistance Program Information  We have supplied [employer] with any of your insurance information on file for billing purposes. [employer] offers a sliding scale Patient Assistance Program for patients that do not have  insurance, or whose insurance does not completely cover the cost of the ZIO monitor.  You must apply for the Patient Assistance Program to qualify for this discounted rate.  To apply, please call [employer] at [phone], select option 4, select option 2, ask to apply for  Patient Assistance Program. Theodore Demark will ask your household income, and how many people  are in your household. They will quote your out-of-pocket cost based on that information.  [employer] will also be able to set up a 60-month interest-free payment plan if needed.  Applying the monitor   Shave hair from upper left chest.  Hold abrader disc by orange tab. Rub abrader in 40 strokes over the upper left chest as  indicated in your monitor instructions.  Clean area with 4 enclosed alcohol pads. Let dry.  Apply patch as indicated in monitor instructions. Patch will be placed under collarbone on left  side of chest with arrow pointing upward.  Rub patch adhesive wings  for 2 minutes. Remove white label marked "1". Remove the white  label marked "2". Rub patch adhesive wings for 2 additional minutes.  While looking in a mirror, press and release button in center of patch. A small green light will  flash 3-4 times. This will be your only indicator that the monitor has been turned on.  Do not shower for the first 24 hours. You may shower after the first 24 hours.  Press the button if you feel a symptom. You will hear a small  click. Record Date, Time and  Symptom in the Patient Logbook.  When you are ready to remove the patch, follow instructions on the last 2 pages of Patient  Logbook. Stick patch monitor onto the last page of Patient Logbook.  Place Patient Logbook in the blue and white box. Use locking tab on box and tape box closed  securely. The blue and white box has prepaid postage on it. Please place it in the mailbox as  soon as possible. Your physician should have your test results approximately 7 days after the  monitor has been mailed back to [facility].  Call Van at [phone] if you have questions regarding  your ZIO XT patch monitor. Call them immediately if you see an orange light blinking on your  monitor.  If your monitor falls off in less than 4 days, contact our Monitor department at [phone].  If your monitor becomes loose or falls off after 4 days call [employer] at [phone] for  suggestions on securing your monitor

## 2022-03-18 NOTE — Progress Notes (Signed)
Samantha Mckinney is a 66 y.o. 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     carvedilol (COREG) 6.25 MG tablet Take 6.25 mg in the morning and  3.125 mg ( 0.5 tablet) in the evening 180 tablet 3   Cholecalciferol (D3 PO) Take 4,000 mg by mouth daily.     clotrimazole-betamethasone (LOTRISONE) cream Apply 1 application. topically 2 (two) times daily. 30 g 0   cyclobenzaprine (FLEXERIL) 10 MG tablet Take 10 mg by mouth 3 (three) times daily as needed for muscle spasms.     magnesium oxide (MAG-OX) 400 MG tablet Take 400 mg by mouth daily.     pantoprazole (PROTONIX) 40 MG tablet Take 1 tablet (40 mg total) by mouth daily. 90 tablet 0   TURMERIC PO turmeric     vitamin C (ASCORBIC ACID) 500 MG tablet Take 500 mg by mouth daily.     zinc gluconate 50 MG tablet Take 50 mg by mouth daily.     No current facility-administered medications for this visit.    Allergies: Morphine and related and Other  Past Medical History:  Diagnosis Date   Allergy    Cataract    Colon polyps    Diverticulitis    Elevated cholesterol    Endometriosis 02/02/2017   Factor V Leiden ([place]) 02/02/2017   GERD (gastroesophageal reflux disease)    IBS (irritable bowel syndrome)    Liver hemangioma 02/02/2017   Low back pain     Lumbar radiculopathy    NASH (nonalcoholic steatohepatitis) 02/02/2017   Scoliosis    Spinal stenosis of lumbar region     Past Surgical History:  Procedure Laterality Date   ABDOMINAL HYSTERECTOMY  1998   TOTAL, on estrogen  until 2012   BACK SURGERY  2000   disc removed L4L5   CHOLECYSTECTOMY     COLONOSCOPY     ESOPHAGOGASTRODUODENOSCOPY  2014   In [place]   HEMORRHOID SURGERY  1996   LAPAROSCOPIC ENDOMETRIOSIS FULGURATION     x 6, 1980-1991   UPPER GASTROINTESTINAL ENDOSCOPY      Family History  Problem Relation Age of Onset   Cirrhosis Mother    Diabetes Mother    Arthritis Mother    Gout Mother    Depression Mother    Bowel Disease Mother    Heart disease Father    Diabetes Father    Heart attack Father 31       during knee surgery   Valvular heart disease Sister        had valve surgery   Depression Sister    Diabetes Brother    Factor V Leiden deficiency Brother        with blood clots   Hyperlipidemia Son    Cancer Maternal Grandmother 41  colon cancer   Colon cancer Maternal Grandmother    Atrial fibrillation Brother    Valvular heart disease Brother        valve surgery   Hypertension Brother    Esophageal cancer Neg Hx    Rectal cancer Neg Hx    Stomach cancer Neg Hx     Social History   Tobacco Use   Smoking status: Never   Smokeless tobacco: Never  Substance Use Topics   Alcohol use: Yes    Comment: OCC     Subjective:  Patient has been having increased episodes of LUQ pain/ lower abdominal pain; notes that feels very tender to touch over upper left ribs; had a diverticulitis flare at Thanksgiving- was treated with antibiotics; does feel that bowel movements have continued to be abnormal- struggling with constipation;  Also requesting to get lipid panel done today; saw cardiologist today and he asked that she get her labs updated;      Objective:  Vitals:   03/18/22 1540  BP: (!) 140/82  Pulse: 80  Resp: 18  Temp: 98.1 F (36.7  C)  TempSrc: Oral  SpO2: 99%  Weight: 171 lb 6.4 oz (77.7 kg)  Height: _0  (1.676 m)    General: Well developed, well nourished, in no acute distress  Skin : Warm and dry.  Head: Normocephalic and atraumatic  Eyes: Sclera and conjunctiva clear; pupils round and reactive to light; extraocular movements intact  Ears: External normal; canals clear; tympanic membranes normal  Oropharynx: Pink, supple. No suspicious lesions  Neck: Supple without thyromegaly, adenopathy  Lungs: Respirations unlabored; clear to auscultation bilaterally without wheeze, rales, rhonchi  CVS exam: normal rate and regular rhythm.  Abdomen: Soft; nontender; nondistended; normoactive bowel sounds; no masses or hepatosplenomegaly  Neurologic: Alert and oriented; speech intact; face symmetrical; moves all extremities well; CNII-XII intact without focal deficit   Assessment:  1. Abdominal pain, unspecified abdominal location   2. LUQ pain   3. Lipid screening     Plan:  Will update abdominal CT- cannot do contrast; she already has appointment scheduled with her GI for later in the month; follow up to be determined; check amylase, lipase, CMP today;  3.   Check lipid panel;   No follow-ups on file.  Orders Placed This Encounter  Procedures   CT ABDOMEN PELVIS WO CONTRAST    Standing Status:   Future    Standing Expiration Date:   03/19/2023    Order Specific Question:   Preferred imaging location?    Answer:   [facility] Specific Question:   Is Oral Contrast requested for this exam?    Answer:   No oral contrast    Order Specific Question:   Reason for No Oral Contrast    Answer:   Contrast allergy    Order Specific Question:   Can barium be used as an alternative?    Answer:   No   Comp Met (CMET)   Amylase   Lipase   Lipid panel    Requested Prescriptions    No prescriptions requested or ordered in this encounter

## 2022-03-19 ENCOUNTER — Encounter: Payer: Self-pay | Admitting: Cardiology

## 2022-03-19 DIAGNOSIS — R079 Chest pain, unspecified: Secondary | ICD-10-CM | POA: Insufficient documentation

## 2022-03-19 LAB — LIPID PANEL
Cholesterol: 210 mg/dL — ABNORMAL HIGH (ref <200)
HDL: 51 mg/dL (ref >=50)
LDL Cholesterol (Calc): 129 mg/dL (calc) — ABNORMAL HIGH
Non-HDL Cholesterol (Calc): 159 mg/dL (calc) — ABNORMAL HIGH (ref <130)
Total CHOL/HDL Ratio: 4.1 (calc) (ref <5.0)
Triglycerides: 189 mg/dL — ABNORMAL HIGH (ref <150)

## 2022-03-19 LAB — COMPREHENSIVE METABOLIC PANEL
AG Ratio: 1.8 (calc) (ref 1.0–2.5)
ALT: 27 U/L (ref 6–29)
AST: 22 U/L (ref 10–35)
Albumin: 4.3 g/dL (ref 3.6–5.1)
Alkaline phosphatase (APISO): 71 U/L (ref 37–153)
BUN: 18 mg/dL (ref 7–25)
CO2: 25 mmol/L (ref 20–32)
Calcium: 9.3 mg/dL (ref 8.6–10.4)
Chloride: 106 mmol/L (ref 98–110)
Creat: 0.8 mg/dL (ref 0.50–1.05)
Globulin: 2.4 g/dL (calc) (ref 1.9–3.7)
Glucose, Bld: 89 mg/dL (ref 65–99)
Potassium: 3.7 mmol/L (ref 3.5–5.3)
Sodium: 142 mmol/L (ref 135–146)
Total Bilirubin: 0.4 mg/dL (ref 0.2–1.2)
Total Protein: 6.7 g/dL (ref 6.1–8.1)

## 2022-03-19 LAB — LIPASE: Lipase: 13 U/L (ref 7–60)

## 2022-03-19 LAB — AMYLASE: Amylase: 44 U/L (ref 21–101)

## 2022-03-19 LAB — SPECIMEN COMPROMISED

## 2022-03-19 NOTE — Assessment & Plan Note (Signed)
Borderline elevated BP today but usually better at home.  On my recheck it was not as prominent.  Was more like 138/78 mmHg.  Plan: With more concerning palpitation issues we will increase her daytime carvedilol dose to 6.25 mg and continue 3.125 mg in the evening.

## 2022-03-19 NOTE — Assessment & Plan Note (Signed)
More frequent occurrences now which may be related to her ongoing stress at work, but this seem to be bothering her.  It is interesting because the seem to be lasting longer than what her monitor showed.  She does have the Apple Watch which clearly confirms tachycardia, but I would like to know whether she is having SVT, sinus tachycardia for him and even the VT.  Plan: We will increase her morning dose of carvedilol to 6.25 mg (full tablet) and continue the 1/2 tablet in the evening (3.125 mg) She wants to wait until after the holidays, we will assess with a 7-day Zio patch monitor in order to evaluate her tachycardia spells again since it more prominent Than the results of the monitor we may want to consider more definitive ischemic evaluation otherwise would simply just do a GXT to evaluate chest pain.

## 2022-03-19 NOTE — Assessment & Plan Note (Addendum)
I ordered labs today to reassess.  Back in January.  LDL was 132, she was open for lifestyle modification related improvement.  Unfortunately LDL really has not changed, and the LDL was triglycerides are higher.  She still wants to try lifestyle modification, but we can risk stratify a little bit first, especially since she is having some intermittent episodes of chest discomfort.  We will await results of Zio patch monitor because this will determine type of ischemic evaluation.  If we only evaluate with GXT, would probably also consider Coronary Calcium Score  Low threshold to initiate medical management-would probably start with rosuvastatin 20 mg.

## 2022-03-22 ENCOUNTER — Ambulatory Visit
Admission: RE | Admit: 2022-03-22 | Discharge: 2022-03-22 | Disposition: A | Discharge status: Home / Self Care | Payer: BLUE CROSS/BLUE SHIELD | Source: Ambulatory Visit | Attending: Family | Admitting: Family

## 2022-03-22 ENCOUNTER — Ambulatory Visit: Payer: No Typology Code available for payment source | Admitting: Family

## 2022-03-22 DIAGNOSIS — R109 Unspecified abdominal pain: Secondary | ICD-10-CM

## 2022-03-25 ENCOUNTER — Ambulatory Visit
Admission: RE | Admit: 2022-03-25 | Discharge: 2022-03-25 | Disposition: A | Discharge status: Home / Self Care | Payer: BLUE CROSS/BLUE SHIELD | Source: Ambulatory Visit | Attending: Family | Admitting: Family

## 2022-03-25 ENCOUNTER — Other Ambulatory Visit: Payer: Self-pay | Admitting: Family

## 2022-03-25 DIAGNOSIS — E2839 Other primary ovarian failure: Secondary | ICD-10-CM

## 2022-03-25 DIAGNOSIS — Z13820 Encounter for screening for osteoporosis: Secondary | ICD-10-CM

## 2022-04-07 ENCOUNTER — Ambulatory Visit: Payer: No Typology Code available for payment source | Admitting: Physician Assistant

## 2022-04-19 ENCOUNTER — Other Ambulatory Visit: Payer: Self-pay | Admitting: Family

## 2022-04-19 DIAGNOSIS — Z1231 Encounter for screening mammogram for malignant neoplasm of breast: Secondary | ICD-10-CM

## 2022-04-28 DIAGNOSIS — I479 Paroxysmal tachycardia, unspecified: Secondary | ICD-10-CM | POA: Diagnosis not present

## 2022-05-02 ENCOUNTER — Other Ambulatory Visit: Payer: Self-pay | Admitting: Family

## 2022-05-11 DIAGNOSIS — I479 Paroxysmal tachycardia, unspecified: Secondary | ICD-10-CM | POA: Diagnosis not present

## 2022-05-24 ENCOUNTER — Ambulatory Visit (INDEPENDENT_AMBULATORY_CARE_PROVIDER_SITE_OTHER): Payer: Medicare Other | Admitting: Family

## 2022-05-24 ENCOUNTER — Encounter: Payer: Self-pay | Admitting: Family

## 2022-05-24 VITALS — BP 122/72 | HR 76 | Temp 97.9°F | Resp 18 | Ht 66.0 in | Wt 173.4 lb

## 2022-05-24 DIAGNOSIS — T148XXA Other injury of unspecified body region, initial encounter: Secondary | ICD-10-CM | POA: Diagnosis not present

## 2022-05-24 DIAGNOSIS — R000 Tachycardia, unspecified: Secondary | ICD-10-CM

## 2022-05-24 DIAGNOSIS — E782 Mixed hyperlipidemia: Secondary | ICD-10-CM

## 2022-05-24 NOTE — Progress Notes (Signed)
Samantha Mckinney is a 67 y.o. female with the following history as recorded in Epic[facility]:  Patient Active Problem List   Diagnosis Date Noted   Chest pain of uncertain etiology 24/40/1027   Rash 09/16/2021   Essential hypertension 03/20/2021   Paroxysmal tachycardia ([place]) 01/07/2020   Low back pain 07/16/2019   Lung nodule 02/02/2017   Factor V Leiden ([place]) 02/02/2017   Mixed hyperlipidemia 02/02/2017   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   Vitamin D deficiency 02/02/2017   Endometriosis 02/02/2017   Abnormal liver CT 02/02/2017   Hiatal hernia 02/02/2017   CKD (chronic kidney disease) 02/02/2017   NASH (nonalcoholic steatohepatitis) 02/02/2017    Current Outpatient Medications  Medication Sig Dispense Refill   B Complex Vitamins (VITAMIN B COMPLEX PO) vitamin B complex     BIOTIN 5000 PO Take by mouth.     carvedilol (COREG) 6.25 MG tablet Take 6.25 mg in the morning and  3.125 mg ( 0.5 tablet) in the evening 180 tablet 3   Cholecalciferol (D3 PO) Take 4,000 mg by mouth daily.     clotrimazole-betamethasone (LOTRISONE) cream Apply 1 application. topically 2 (two) times daily. 30 g 0   cyclobenzaprine (FLEXERIL) 10 MG tablet Take 10 mg by mouth 3 (three) times daily as needed for muscle spasms.     magnesium oxide (MAG-OX) 400 MG tablet Take 400 mg by mouth daily.     pantoprazole (PROTONIX) 40 MG tablet TAKE 1 TABLET BY MOUTH DAILY 90 tablet 0   TURMERIC PO turmeric     vitamin C (ASCORBIC ACID) 500 MG tablet Take 500 mg by mouth daily.     zinc gluconate 50 MG tablet Take 50 mg by mouth daily.     No current facility-administered medications for this visit.    Allergies: Morphine and related and Other  Past Medical History:  Diagnosis Date   Allergy    Cataract    Colon polyps    Diverticulitis    Elevated cholesterol    Endometriosis 02/02/2017   Factor V Leiden ([place]) 02/02/2017   GERD (gastroesophageal reflux disease)    IBS (irritable bowel syndrome)    Liver hemangioma  02/02/2017   Low back pain    Lumbar radiculopathy    NASH (nonalcoholic steatohepatitis) 02/02/2017   Scoliosis    Spinal stenosis of lumbar region     Past Surgical History:  Procedure Laterality Date   ABDOMINAL HYSTERECTOMY  1998   TOTAL, on estrogen  until 2012   BACK SURGERY  2000   disc removed L4L5   CHOLECYSTECTOMY     COLONOSCOPY     ESOPHAGOGASTRODUODENOSCOPY  2014   In [place]   HEMORRHOID SURGERY  1996   LAPAROSCOPIC ENDOMETRIOSIS FULGURATION     x 6, 1980-1991   UPPER GASTROINTESTINAL ENDOSCOPY      Family History  Problem Relation Age of Onset   Cirrhosis Mother    Diabetes Mother    Arthritis Mother    Gout Mother    Depression Mother    Bowel Disease Mother    Heart disease Father    Diabetes Father    Heart attack Father 75       during knee surgery   Valvular heart disease Sister        had valve surgery   Depression Sister    Diabetes Brother    Factor V Leiden deficiency Brother        with blood clots   Hyperlipidemia Son    Cancer Maternal  Grandmother 29       colon cancer   Colon cancer Maternal Grandmother    Atrial fibrillation Brother    Valvular heart disease Brother        valve surgery   Hypertension Brother    Esophageal cancer Neg Hx    Rectal cancer Neg Hx    Stomach cancer Neg Hx     Social History   Tobacco Use   Smoking status: Never   Smokeless tobacco: Never  Substance Use Topics   Alcohol use: Yes    Comment: OCC     Subjective:   Patient has recently gotten onto Medicare and was told to schedule a follow up to make sure all her preventive care needs are up to date;  Mammogram is scheduled for later this month; DEXA in May; up to date on colonoscopy; Defers updating pneumonia or shingles vaccines;  Is scheduled to see her cardiologist next week to discuss continued atypical chest pain/ fluctuations in her heart rate and lipid panel; she was seen initially by them in December 2023 for similar symptoms;   Has been  concerned about recent episodes of unexplained bruising; planning to discuss with her cardiologist next week; per patient, not taking regular doses of aspirin;   Objective:  Vitals:   05/24/22 1347  BP: 122/72  Pulse: 76  Resp: 18  Temp: 97.9 F (36.6 C)  TempSrc: Oral  SpO2: 98%  Weight: 173 lb 6.4 oz (78.7 kg)  Height: '5\' 6"'$  (1.676 m)    General: Well developed, well nourished, in no acute distress  Skin : Warm and dry. Bruise noted on right forearm; Head: Normocephalic and atraumatic  Eyes: Sclera and conjunctiva clear; pupils round and reactive to light; extraocular movements intact  Ears: External normal; canals clear; tympanic membranes normal  Oropharynx: Pink, supple. No suspicious lesions  Neck: Supple without thyromegaly, adenopathy  Lungs: Respirations unlabored; clear to auscultation bilaterally without wheeze, rales, rhonchi  CVS exam: normal rate and regular rhythm.  Neurologic: Alert and oriented; speech intact; face symmetrical; moves all extremities well; CNII-XII intact without focal deficit   Assessment:  1. Bruising   2. Mixed hyperlipidemia   3. Tachycardia     Plan:  Will check CBC today;  Reviewed lipid panel done in December 2023- she will plan to review with her cardiologist at upcoming appointment; Keep planned follow up with cardiology for next week; No follow-ups on file.  Orders Placed This Encounter  Procedures   CBC with Differential/Platelet    Requested Prescriptions    No prescriptions requested or ordered in this encounter

## 2022-05-25 LAB — CBC WITH DIFFERENTIAL/PLATELET
Basophils Absolute: 0.1 10*3/uL (ref 0.0–0.1)
Basophils Relative: 1.7 % (ref 0.0–3.0)
Eosinophils Absolute: 0.2 10*3/uL (ref 0.0–0.7)
Eosinophils Relative: 3.5 % (ref 0.0–5.0)
HCT: 39.6 % (ref 36.0–46.0)
Hemoglobin: 13.4 g/dL (ref 12.0–15.0)
Lymphocytes Relative: 39.8 % (ref 12.0–46.0)
Lymphs Abs: 2.2 10*3/uL (ref 0.7–4.0)
MCHC: 33.8 g/dL (ref 30.0–36.0)
MCV: 92 fl (ref 78.0–100.0)
Monocytes Absolute: 0.5 10*3/uL (ref 0.1–1.0)
Monocytes Relative: 8.4 % (ref 3.0–12.0)
Neutro Abs: 2.6 10*3/uL (ref 1.4–7.7)
Neutrophils Relative %: 46.6 % (ref 43.0–77.0)
Platelets: 249 10*3/uL (ref 150.0–400.0)
RBC: 4.3 Mil/uL (ref 3.87–5.11)
RDW: 12.5 % (ref 11.5–15.5)
WBC: 5.6 10*3/uL (ref 4.0–10.5)

## 2022-05-31 ENCOUNTER — Ambulatory Visit: Payer: Medicare Other | Attending: Cardiology | Admitting: Cardiology

## 2022-05-31 ENCOUNTER — Encounter: Payer: Self-pay | Admitting: Cardiology

## 2022-05-31 VITALS — BP 120/82 | HR 67 | Ht 66.5 in | Wt 170.8 lb

## 2022-05-31 DIAGNOSIS — M7989 Other specified soft tissue disorders: Secondary | ICD-10-CM

## 2022-05-31 DIAGNOSIS — E782 Mixed hyperlipidemia: Secondary | ICD-10-CM

## 2022-05-31 DIAGNOSIS — M79602 Pain in left arm: Secondary | ICD-10-CM | POA: Diagnosis not present

## 2022-05-31 DIAGNOSIS — I479 Paroxysmal tachycardia, unspecified: Secondary | ICD-10-CM

## 2022-05-31 DIAGNOSIS — I10 Essential (primary) hypertension: Secondary | ICD-10-CM

## 2022-05-31 DIAGNOSIS — S40022A Contusion of left upper arm, initial encounter: Secondary | ICD-10-CM

## 2022-05-31 NOTE — Progress Notes (Signed)
Primary Care Provider: Marrian Salvage, [place] Cardiologist: Glenetta Hew, MD Electrophysiologist: None  Clinic Note: Chief Complaint  Patient presents with   Follow-up    Test results.   Tachycardia    Zio patch monitor results   Bleeding/Bruising    " Blood bursts" on arms after left arm pain   ===================================  ASSESSMENT/PLAN   Problem List Items Addressed This Visit       Cardiology Problems   Paroxysmal tachycardia (HCC) - Primary (Chronic)    I suspect that she is having short runs of PAT or SVT as seen on the monitor.  They seem to have improved with increased dose of carvedilol.  He still have room to push to 6.25 mg twice daily if necessary.  I do not know that short little bursts of 7-15 beats PVCs warrants further ischemic evaluation, however if she were to have worsening symptoms, we can consider more complete ischemic evaluation.  Continue increased dose of carvedilol 6.25 mg in the morning and 3.125 mg in the evening.      Relevant Orders   EKG 12-Lead (Completed)   Mixed hyperlipidemia (Chronic)    She would very much like to avoid taking medications that are prescription for her lipids.  Current panel is less than favorable with an LDL of 129 and total cholesterol 210.  Plan for now is to try lifestyle modification but for OTC options we discussed deficient Eliquis oil up to 3000 mg a day, could also consider red yeast rice and  Co-Q10  Would just reassess during routine follow-up.      Essential hypertension (Chronic)    Blood pressure looks much better today.  Perhaps the increase in the carvedilol dose helped.  Continue current dose.      Relevant Orders   EKG 12-Lead (Completed)     Other   Superficial bruising of arm, left, initial encounter    This does not look as much like bruising as it does purpura.  Weird locations though and associated with arm pain which is unusual.  Her CBC does not  look all that abnormal which would make ITP/TTP conditions less likely.  Recommend that she discuss with PCP and consider the possibility of heme-onc consult.      Relevant Orders   VAS [place] UPPER EXTREMITY ARTERIAL DUPLEX   VAS [place] UPPER EXTREMITY VENOUS DUPLEX   Left arm swelling    Interesting that she has unilateral left arm swelling and discomfort.  Is not exertional, always at rest and then followed by these random areas of purpura.  Plan: Check lower left arm arterial and venous Dopplers.      Relevant Orders   VAS [place] UPPER EXTREMITY ARTERIAL DUPLEX   VAS [place] UPPER EXTREMITY VENOUS DUPLEX   Left arm pain    Strange left arm discomfort that is not at all associated with chest discomfort dyspnea or anything to suggest cardiac etiology.  Will evaluate for arterial or venous insufficiency or occlusion.      Relevant Orders   VAS [place] UPPER EXTREMITY ARTERIAL DUPLEX   VAS [place] UPPER EXTREMITY VENOUS DUPLEX  ===================================  HPI:    Samantha Mckinney is a 67 y.o. female with a PMH notable for factor V Leiden, Chronic LBP/DJD-Lumbar Spine with Radiculopathy, HTN, HLD, and Paroxysmal Tachycardia who presents today for 6-8 week follow-up at the request of Marrian Salvage,*.   Samantha Mckinney was last seen on back on March 18, 2022 for annual follow-up.  She was noticing more than usual rapid heart rate spells going anywhere from 107 to 142 bpm noted on Apple Watch.  Relatively short-lived spells that she is not all that symptomatic with.  They are relieved short little blips faster heart rates.  Usually these are occurring with rest.  Average heart rate is anywhere from 70 to 75 bpm.  Blood pressure also in the 115-120/65-70 mmHg range.  At baseline she, she she does routine exercise riding her bike and 35 minutes of stretching to start every day and does not really notice tachycardia during the spells. Increased carvedilol daytime dose to 6.25 mg continue p.m. dose of  3.125 mg Lipids ordered-she indicates a desire to try lifestyle modification. Zio patch ordered to evaluate palpitations. => Based on results, would consider GXT versus coronary calcium score.  Recent Hospitalizations: None  Reviewed  CV studies:    The following studies were reviewed today: (if available, images/films reviewed: From Epic Chart or Care Everywhere) ZIO Patch:  SR, 52-121 bpm, avg 73 bpm.  2 NSVT 7 & 13 beats; otw rate PACs & PVCs.    Interval History:   Samantha Mckinney returns here today for 54-monthfollow-up to discuss results of her Zio patch.  She was able to compare her Apple watch findings with the Zio patch and we did see that these very short-lived blips of fast heart rates correlated pretty well with the runs of NSVT.  She really has not had that many other episodes.  She did note that the slightly higher dose of beta-blocker helped the palpitations.  No longer having the chest discomfort episodes.  Unusual new symptoms are these "blood burst "episodes. ->  She started noticing small little bruise like splotches on the upper arm and lower arm while changing clothes.  She was trying to figure out what was happening because she is not taking even aspirin as a blood thinner, and clearly was not hitting anything to cause any bruises.  A lot of times these small splotches of bruising would occur shortly after a pretty significant bout of pain in the right arm also numbness and squeezing (like a blood pressure cuff is on her arm) in that arm that would last several minutes.  Pain would always occur at rest and once it was over she would notice these little bruised spots.  CV Review of Symptoms (Summary): no chest pain or dyspnea on exertion positive for - irregular heartbeat, palpitations, rapid heart rate, and these are notably improved on increased dose of beta-blocker negative for - dyspnea on exertion, edema, orthopnea, paroxysmal nocturnal dyspnea, shortness of breath, or  lightheadedness or dizziness, syncope/near syncope or TIA/amaurosis fugax, claudication  REVIEWED OF SYSTEMS   Review of Systems  Constitutional:  Positive for weight loss (Intentional weight loss (had gotten up to 179 pounds)). Negative for malaise/fatigue.  HENT:  Negative for congestion and sinus pain.   Respiratory:  Negative for cough and shortness of breath.   Gastrointestinal:  Negative for blood in stool and melena.  Genitourinary:  Negative for hematuria.  Musculoskeletal:  Positive for joint pain. Negative for back pain.  Neurological:  Negative for dizziness and focal weakness.  Psychiatric/Behavioral:  The patient is nervous/anxious.    I have reviewed and (if needed) personally updated the patient's problem list, medications, allergies, past medical and surgical history, social and family history.   PAST MEDICAL HISTORY   Past Medical History:  Diagnosis Date   Allergy    Cataract  Colon polyps    Diverticulitis    Elevated cholesterol    Endometriosis 02/02/2017   Factor V Leiden ([place]) 02/02/2017   GERD (gastroesophageal reflux disease)    IBS (irritable bowel syndrome)    Liver hemangioma 02/02/2017   Low back pain    Lumbar radiculopathy    NASH (nonalcoholic steatohepatitis) 02/02/2017   Scoliosis    Spinal stenosis of lumbar region    PAST SURGICAL HISTORY   Past Surgical History:  Procedure Laterality Date   ABDOMINAL HYSTERECTOMY  1998   TOTAL, on estrogen  until 2012   BACK SURGERY  2000   disc removed L4L5   CHOLECYSTECTOMY     COLONOSCOPY     ESOPHAGOGASTRODUODENOSCOPY  2014   In [place]   HEMORRHOID SURGERY  1996   LAPAROSCOPIC ENDOMETRIOSIS FULGURATION     x 6, 1980-1991   UPPER GASTROINTESTINAL ENDOSCOPY      MEDICATIONS/ALLERGIES   Current Meds  Medication Sig   B Complex Vitamins (VITAMIN B COMPLEX PO) vitamin B complex   BIOTIN 5000 PO Take by mouth.   carvedilol (COREG) 6.25 MG tablet Take 6.25 mg in the morning and  3.125 mg (  0.5 tablet) in the evening   Cholecalciferol (D3 PO) Take 4,000 mg by mouth daily.   clotrimazole-betamethasone (LOTRISONE) cream Apply 1 application. topically 2 (two) times daily.   cyclobenzaprine (FLEXERIL) 10 MG tablet Take 10 mg by mouth 3 (three) times daily as needed for muscle spasms.   magnesium oxide (MAG-OX) 400 MG tablet Take 400 mg by mouth daily.   pantoprazole (PROTONIX) 40 MG tablet TAKE 1 TABLET BY MOUTH DAILY   TURMERIC PO turmeric   vitamin C (ASCORBIC ACID) 500 MG tablet Take 500 mg by mouth daily.   zinc gluconate 50 MG tablet Take 50 mg by mouth daily.     Allergies  Allergen Reactions   Morphine And Related Swelling   Other Other (See Comments)    Contrast dye allergy - N/V/D, swelling    SOCIAL HISTORY/FAMILY HISTORY   Reviewed in Epic:  Pertinent findings:  Social History   Tobacco Use   Smoking status: Never   Smokeless tobacco: Never  Vaping Use   Vaping Use: Never used  Substance Use Topics   Alcohol use: Yes    Comment: OCC    Drug use: Never   Social History   Social History Narrative   Lives with husband   Caffeine- coffee 1-2 a day   Not able to exercise 2/2 back pain    OBJCTIVE -PE, EKG, labs   Wt Readings from Last 3 Encounters:  05/31/22 170 lb 12.8 oz (77.5 kg)  05/24/22 173 lb 6.4 oz (78.7 kg)  03/18/22 171 lb 6.4 oz (77.7 kg)  - not trying - but is losing.   Physical Exam: BP 120/82   Pulse 67   Ht 5' 6.5" (1.689 m)   Wt 170 lb 12.8 oz (77.5 kg)   SpO2 99%   BMI 27.15 kg/m  Physical Exam Vitals reviewed.  Constitutional:      General: She is not in acute distress.    Appearance: Normal appearance. She is normal weight. She is not ill-appearing (Well-nourished, well-groomed) or toxic-appearing.  HENT:     Head: Normocephalic and atraumatic.  Cardiovascular:     Rate and Rhythm: Normal rate and regular rhythm.  Pulmonary:     Effort: Pulmonary effort is normal. No respiratory distress.     Breath sounds:  Normal breath sounds.  Musculoskeletal:        General: No swelling. Normal range of motion.     Cervical back: Normal range of motion and neck supple.  Skin:    General: Skin is warm and dry.     Findings: Bruising (Left upper and lower arm but also some on the right = appears to be more like purpura) present.  Neurological:     General: No focal deficit present.     Mental Status: She is alert and oriented to person, place, and time.     Gait: Gait normal.  Psychiatric:        Mood and Affect: Mood normal.        Behavior: Behavior normal.        Thought Content: Thought content normal.        Judgment: Judgment normal.     Adult ECG Report  Rate: 67;  Rhythm: normal sinus rhythm and nonspecific ST-T wave changes.  Otherwise normal axis, intervals and durations. ;   Narrative Interpretation: Stable; no change     Recent Labs: Reviewed Lab Results  Component Value Date   CHOL 210 (H) 03/18/2022   HDL 51 03/18/2022   LDLCALC 129 (H) 03/18/2022   TRIG 189 (H) 03/18/2022   CHOLHDL 4.1 03/18/2022   Lab Results  Component Value Date   CREATININE 0.80 03/18/2022   BUN 18 03/18/2022   NA 142 03/18/2022   K 3.7 03/18/2022   CL 106 03/18/2022   CO2 25 03/18/2022      Latest Ref Rng & Units 05/24/2022    2:32 PM 05/13/2021    7:31 AM 11/28/2020    2:46 PM  CBC  WBC 4.0 - 10.5 K/uL 5.6  4.6  7.0   Hemoglobin 12.0 - 15.0 g/dL 13.4  13.3  13.7   Hematocrit 36.0 - 46.0 % 39.6  39.7  41.4   Platelets 150.0 - 400.0 K/uL 249.0  239.0  244     Lab Results  Component Value Date   HGBA1C 5.6 08/08/2019   Lab Results  Component Value Date   TSH 1.33 05/13/2021    ================================================== I spent a total of 33 minutes with the patient spent in direct patient consultation.  Additional time spent with chart review  / charting (studies, outside notes, etc): 14 min Total Time: 47 min  Current medicines are reviewed at length with the patient today.   (+/- concerns) N/A  Notice: This dictation was prepared with Dragon dictation along with smart phrase technology. Any transcriptional errors that result from this process are unintentional and may not be corrected upon review.  Studies Ordered:   Orders Placed This Encounter  Procedures   EKG 12-Lead   VAS [place] UPPER EXTREMITY ARTERIAL DUPLEX   VAS [place] UPPER EXTREMITY VENOUS DUPLEX   No orders of the defined types were placed in this encounter.   Patient Instructions / Medication Changes & Studies & Tests Ordered   Patient Instructions  Medication Instructions:   Try  Fish oil Javier Docker oil  3, 000 mg day - red yeast rice *If you need a refill on your cardiac medications before your next appointment, please call your pharmacy*   Lab Work: Not needed    Testing/Procedures: Will be schedule for both  [place] has requested that you have a  upper extremity arterial duplex. This test is an ultrasound of the arteries in the arms. It looks at arterial blood flow in the arms.  Allow one hour for Upper Arterial scans. There are no restrictions or special instructions    Your physician has requested that you have a lower or upper extremity venous duplex. This test is an ultrasound of the veins in the legs or arms. It looks at venous blood flow that carries blood from the heart to the legs or arms. Allow one hour for a Lower Venous exam. Allow thirty minutes for an Upper Venous exam. There are no restrictions or special instructions.    Follow-Up: At [facility], you and your health needs are our priority.  As part of our continuing mission to provide you with exceptional heart care, we have created designated Provider Care Teams.  These Care Teams include your primary Cardiologist (physician) and Advanced Practice Providers (APPs -  Physician Assistants and Nurse Practitioners) who all work together to provide you with the care you need, when you need  it.     Your next appointment:   8 month(s)  Oct 2024  The format for your next appointment:   In Person  Provider:   Glenetta Hew, MD    Other Instruction   Would recommend you follow up with hematology if test come back normal and symptoms continue    Leonie Man, MD, MS Glenetta Hew, M.D., M.S. Interventional Cardiologist  [place]  Pager # [phone] Phone # [phone] [address] [place], [place] [address]   Thank you for choosing [facility]!!

## 2022-05-31 NOTE — Patient Instructions (Addendum)
Medication Instructions:   Try  Fish oil Javier Docker oil  3, 000 mg day - red yeast rice *If you need a refill on your cardiac medications before your next appointment, please call your pharmacy*   Lab Work: Not needed    Testing/Procedures: Will be schedule for both  [place] has requested that you have a  upper extremity arterial duplex. This test is an ultrasound of the arteries in the arms. It looks at arterial blood flow in the arms. Allow one hour for Upper Arterial scans. There are no restrictions or special instructions    Your physician has requested that you have a lower or upper extremity venous duplex. This test is an ultrasound of the veins in the legs or arms. It looks at venous blood flow that carries blood from the heart to the legs or arms. Allow one hour for a Lower Venous exam. Allow thirty minutes for an Upper Venous exam. There are no restrictions or special instructions.    Follow-Up: At [facility], you and your health needs are our priority.  As part of our continuing mission to provide you with exceptional heart care, we have created designated Provider Care Teams.  These Care Teams include your primary Cardiologist (physician) and Advanced Practice Providers (APPs -  Physician Assistants and Nurse Practitioners) who all work together to provide you with the care you need, when you need it.     Your next appointment:   8 month(s)  Oct 2024  The format for your next appointment:   In Person  Provider:   Glenetta Hew, MD    Other Instruction   Would recommend you follow up with hematology if test come back normal and symptoms continue

## 2022-06-08 ENCOUNTER — Ambulatory Visit
Admission: RE | Admit: 2022-06-08 | Discharge: 2022-06-08 | Disposition: A | Discharge status: Home / Self Care | Payer: Medicare Other | Source: Ambulatory Visit | Attending: Family | Admitting: Family

## 2022-06-08 DIAGNOSIS — Z1231 Encounter for screening mammogram for malignant neoplasm of breast: Secondary | ICD-10-CM

## 2022-06-09 ENCOUNTER — Encounter: Payer: Self-pay | Admitting: Cardiology

## 2022-06-09 DIAGNOSIS — M7989 Other specified soft tissue disorders: Secondary | ICD-10-CM | POA: Insufficient documentation

## 2022-06-09 DIAGNOSIS — M79602 Pain in left arm: Secondary | ICD-10-CM | POA: Insufficient documentation

## 2022-06-09 DIAGNOSIS — S40022A Contusion of left upper arm, initial encounter: Secondary | ICD-10-CM | POA: Insufficient documentation

## 2022-06-09 NOTE — Assessment & Plan Note (Signed)
Blood pressure looks much better today.  Perhaps the increase in the carvedilol dose helped.  Continue current dose.

## 2022-06-09 NOTE — Assessment & Plan Note (Signed)
She would very much like to avoid taking medications that are prescription for her lipids.  Current panel is less than favorable with an LDL of 129 and total cholesterol 210.  Plan for now is to try lifestyle modification but for OTC options we discussed deficient Eliquis oil up to 3000 mg a day, could also consider red yeast rice and  Co-Q10  Would just reassess during routine follow-up.

## 2022-06-09 NOTE — Assessment & Plan Note (Signed)
Interesting that she has unilateral left arm swelling and discomfort.  Is not exertional, always at rest and then followed by these random areas of purpura.  Plan: Check lower left arm arterial and venous Dopplers.

## 2022-06-09 NOTE — Assessment & Plan Note (Signed)
This does not look as much like bruising as it does purpura.  Weird locations though and associated with arm pain which is unusual.  Her CBC does not look all that abnormal which would make ITP/TTP conditions less likely.  Recommend that she discuss with PCP and consider the possibility of heme-onc consult.

## 2022-06-09 NOTE — Assessment & Plan Note (Signed)
Strange left arm discomfort that is not at all associated with chest discomfort dyspnea or anything to suggest cardiac etiology.  Will evaluate for arterial or venous insufficiency or occlusion.

## 2022-06-09 NOTE — Assessment & Plan Note (Signed)
I suspect that she is having short runs of PAT or SVT as seen on the monitor.  They seem to have improved with increased dose of carvedilol.  He still have room to push to 6.25 mg twice daily if necessary.  I do not know that short little bursts of 7-15 beats PVCs warrants further ischemic evaluation, however if she were to have worsening symptoms, we can consider more complete ischemic evaluation.  Continue increased dose of carvedilol 6.25 mg in the morning and 3.125 mg in the evening.

## 2022-06-13 ENCOUNTER — Ambulatory Visit (HOSPITAL_COMMUNITY)
Admission: RE | Admit: 2022-06-13 | Discharge: 2022-06-13 | Disposition: A | Discharge status: Home / Self Care | Payer: Medicare Other | Source: Ambulatory Visit | Attending: Cardiology | Admitting: Cardiology

## 2022-06-13 ENCOUNTER — Other Ambulatory Visit (HOSPITAL_COMMUNITY): Payer: Medicare Other

## 2022-06-13 DIAGNOSIS — M7989 Other specified soft tissue disorders: Secondary | ICD-10-CM | POA: Insufficient documentation

## 2022-06-13 DIAGNOSIS — M79602 Pain in left arm: Secondary | ICD-10-CM | POA: Diagnosis not present

## 2022-06-13 DIAGNOSIS — S40022A Contusion of left upper arm, initial encounter: Secondary | ICD-10-CM | POA: Diagnosis not present

## 2022-06-22 ENCOUNTER — Ambulatory Visit (HOSPITAL_COMMUNITY)
Admission: RE | Admit: 2022-06-22 | Discharge: 2022-06-22 | Disposition: A | Discharge status: Home / Self Care | Payer: Medicare Other | Source: Ambulatory Visit | Attending: Internal Medicine | Admitting: Internal Medicine

## 2022-06-22 DIAGNOSIS — S40022A Contusion of left upper arm, initial encounter: Secondary | ICD-10-CM | POA: Diagnosis not present

## 2022-06-22 DIAGNOSIS — M79602 Pain in left arm: Secondary | ICD-10-CM | POA: Diagnosis not present

## 2022-06-22 DIAGNOSIS — M7989 Other specified soft tissue disorders: Secondary | ICD-10-CM | POA: Diagnosis not present

## 2022-07-05 DIAGNOSIS — M545 Low back pain, unspecified: Secondary | ICD-10-CM | POA: Diagnosis not present

## 2022-07-06 DIAGNOSIS — M222X1 Patellofemoral disorders, right knee: Secondary | ICD-10-CM | POA: Diagnosis not present

## 2022-07-06 DIAGNOSIS — M25562 Pain in left knee: Secondary | ICD-10-CM | POA: Diagnosis not present

## 2022-07-13 DIAGNOSIS — M6289 Other specified disorders of muscle: Secondary | ICD-10-CM | POA: Diagnosis not present

## 2022-07-13 DIAGNOSIS — M6281 Muscle weakness (generalized): Secondary | ICD-10-CM | POA: Diagnosis not present

## 2022-07-13 DIAGNOSIS — M25562 Pain in left knee: Secondary | ICD-10-CM | POA: Diagnosis not present

## 2022-07-13 DIAGNOSIS — R600 Localized edema: Secondary | ICD-10-CM | POA: Diagnosis not present

## 2022-07-14 DIAGNOSIS — M5416 Radiculopathy, lumbar region: Secondary | ICD-10-CM | POA: Diagnosis not present

## 2022-07-28 DIAGNOSIS — M5136 Other intervertebral disc degeneration, lumbar region: Secondary | ICD-10-CM | POA: Diagnosis not present

## 2022-07-28 DIAGNOSIS — M5416 Radiculopathy, lumbar region: Secondary | ICD-10-CM | POA: Diagnosis not present

## 2022-08-03 DIAGNOSIS — M25562 Pain in left knee: Secondary | ICD-10-CM | POA: Diagnosis not present

## 2022-08-03 DIAGNOSIS — R600 Localized edema: Secondary | ICD-10-CM | POA: Diagnosis not present

## 2022-08-03 DIAGNOSIS — M6289 Other specified disorders of muscle: Secondary | ICD-10-CM | POA: Diagnosis not present

## 2022-08-03 DIAGNOSIS — M6281 Muscle weakness (generalized): Secondary | ICD-10-CM | POA: Diagnosis not present

## 2022-08-11 DIAGNOSIS — M5416 Radiculopathy, lumbar region: Secondary | ICD-10-CM | POA: Diagnosis not present

## 2022-08-26 DIAGNOSIS — M25562 Pain in left knee: Secondary | ICD-10-CM | POA: Diagnosis not present

## 2022-08-26 DIAGNOSIS — M25561 Pain in right knee: Secondary | ICD-10-CM | POA: Diagnosis not present

## 2022-08-29 DIAGNOSIS — M25552 Pain in left hip: Secondary | ICD-10-CM | POA: Diagnosis not present

## 2022-08-29 DIAGNOSIS — M5459 Other low back pain: Secondary | ICD-10-CM | POA: Diagnosis not present

## 2022-09-07 ENCOUNTER — Other Ambulatory Visit: Payer: Self-pay

## 2022-09-08 DIAGNOSIS — M4807 Spinal stenosis, lumbosacral region: Secondary | ICD-10-CM | POA: Diagnosis not present

## 2022-09-08 DIAGNOSIS — M25452 Effusion, left hip: Secondary | ICD-10-CM | POA: Diagnosis not present

## 2022-09-08 DIAGNOSIS — S73192A Other sprain of left hip, initial encounter: Secondary | ICD-10-CM | POA: Diagnosis not present

## 2022-09-08 DIAGNOSIS — M2578 Osteophyte, vertebrae: Secondary | ICD-10-CM | POA: Diagnosis not present

## 2022-09-08 DIAGNOSIS — M160 Bilateral primary osteoarthritis of hip: Secondary | ICD-10-CM | POA: Diagnosis not present

## 2022-09-08 DIAGNOSIS — M48061 Spinal stenosis, lumbar region without neurogenic claudication: Secondary | ICD-10-CM | POA: Diagnosis not present

## 2022-09-08 DIAGNOSIS — M7602 Gluteal tendinitis, left hip: Secondary | ICD-10-CM | POA: Diagnosis not present

## 2022-09-08 DIAGNOSIS — M5127 Other intervertebral disc displacement, lumbosacral region: Secondary | ICD-10-CM | POA: Diagnosis not present

## 2022-09-13 DIAGNOSIS — M1612 Unilateral primary osteoarthritis, left hip: Secondary | ICD-10-CM | POA: Diagnosis not present

## 2022-09-13 DIAGNOSIS — M7062 Trochanteric bursitis, left hip: Secondary | ICD-10-CM | POA: Diagnosis not present

## 2022-09-14 DIAGNOSIS — M5136 Other intervertebral disc degeneration, lumbar region: Secondary | ICD-10-CM | POA: Diagnosis not present

## 2022-10-12 DIAGNOSIS — M25552 Pain in left hip: Secondary | ICD-10-CM | POA: Diagnosis not present

## 2022-11-07 DIAGNOSIS — M7062 Trochanteric bursitis, left hip: Secondary | ICD-10-CM | POA: Diagnosis not present

## 2022-11-07 DIAGNOSIS — M1612 Unilateral primary osteoarthritis, left hip: Secondary | ICD-10-CM | POA: Diagnosis not present

## 2022-11-16 ENCOUNTER — Other Ambulatory Visit: Payer: Self-pay

## 2022-11-16 ENCOUNTER — Ambulatory Visit
Admission: EM | Admit: 2022-11-16 | Discharge: 2022-11-16 | Disposition: A | Discharge status: Home / Self Care | Payer: Medicare Other | Attending: Family Medicine | Admitting: Family Medicine

## 2022-11-16 DIAGNOSIS — J029 Acute pharyngitis, unspecified: Secondary | ICD-10-CM | POA: Insufficient documentation

## 2022-11-16 LAB — POCT RAPID STREP A (OFFICE): Rapid Strep A Screen: NEGATIVE

## 2022-11-16 MED ORDER — AZITHROMYCIN 250 MG PO TABS: 250.0000 mg | ORAL_TABLET | Freq: Every day | ORAL | 0 refills | Status: DC

## 2022-11-16 MED ORDER — PREDNISONE 20 MG PO TABS: ORAL_TABLET | ORAL | 0 refills | Status: DC

## 2022-11-16 NOTE — ED Provider Notes (Signed)
[facility]    CSN: [id number] Arrival date & time: 11/16/22  7829      History   Chief Complaint No chief complaint on file.   HPI Samantha Mckinney is a 67 y.o. female.   HPI Pleasant 67 year old female presents with sore throat for 4 days.  Patient reports sore throat very painful this morning while swallowing her coffee.  PMH significant for obesity, lumbar radiculopathy, and endometriosis.  Past Medical History:  Diagnosis Date   Allergy    Cataract    Colon polyps    Diverticulitis    Elevated cholesterol    Endometriosis 02/02/2017   Factor V Leiden (HCC) 02/02/2017   GERD (gastroesophageal reflux disease)    IBS (irritable bowel syndrome)    Liver hemangioma 02/02/2017   Low back pain    Lumbar radiculopathy    NASH (nonalcoholic steatohepatitis) 02/02/2017   Scoliosis    Spinal stenosis of lumbar region     Patient Active Problem List   Diagnosis Date Noted   Left arm swelling 06/09/2022   Superficial bruising of arm, left, initial encounter 06/09/2022   Left arm pain 06/09/2022   Chest pain of uncertain etiology 03/19/2022   Rash 09/16/2021   Essential hypertension 03/20/2021   Paroxysmal tachycardia (HCC) 01/07/2020   Low back pain 07/16/2019   Lung nodule 02/02/2017   Factor V Leiden (HCC) 02/02/2017   Mixed hyperlipidemia 02/02/2017   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   Vitamin D deficiency 02/02/2017   Endometriosis 02/02/2017   Abnormal liver CT 02/02/2017   Hiatal hernia 02/02/2017   CKD (chronic kidney disease) 02/02/2017   NASH (nonalcoholic steatohepatitis) 02/02/2017    Past Surgical History:  Procedure Laterality Date   ABDOMINAL HYSTERECTOMY  1998   TOTAL, on estrogen  until 2012   BACK SURGERY  2000   disc removed L4L5   CHOLECYSTECTOMY     COLONOSCOPY     ESOPHAGOGASTRODUODENOSCOPY  2014   In [place]   HEMORRHOID SURGERY  1996   LAPAROSCOPIC ENDOMETRIOSIS FULGURATION     x 6, 1980-1991   UPPER GASTROINTESTINAL  ENDOSCOPY      OB History     Gravida  1   Para  1   Term      Preterm      AB      Living  1      SAB      IAB      Ectopic      Multiple      Live Births  1            Home Medications    Prior to Admission medications   Medication Sig Start Date End Date Taking? Authorizing Provider  azithromycin (ZITHROMAX) 250 MG tablet Take 1 tablet (250 mg total) by mouth daily. Take first 2 tablets together, then 1 every day until finished. 11/16/22  Yes Trevor Iha, FNP  predniSONE (DELTASONE) 20 MG tablet Take 3 tabs PO daily x 5 days. 11/16/22  Yes Trevor Iha, FNP  B Complex Vitamins (VITAMIN B COMPLEX PO) vitamin B complex    [provider]  BIOTIN 5000 PO Take by mouth.    [provider]  carvedilol (COREG) 6.25 MG tablet Take 6.25 mg in the morning and  3.125 mg ( 0.5 tablet) in the evening 03/18/22   Marykay Lex, MD  Cholecalciferol (D3 PO) Take 4,000 mg by mouth daily.    [provider]  clotrimazole-betamethasone (LOTRISONE) cream Apply 1 application.  topically 2 (two) times daily. 09/16/21   Donato Schultz, DO  cyclobenzaprine (FLEXERIL) 10 MG tablet Take 10 mg by mouth 3 (three) times daily as needed for muscle spasms.    [provider]  magnesium oxide (MAG-OX) 400 MG tablet Take 400 mg by mouth daily.    [provider]  pantoprazole (PROTONIX) 40 MG tablet TAKE 1 TABLET BY MOUTH DAILY 05/02/22   Olive Bass, FNP  TURMERIC PO turmeric    [provider]  vitamin C (ASCORBIC ACID) 500 MG tablet Take 500 mg by mouth daily.    [provider]  zinc gluconate 50 MG tablet Take 50 mg by mouth daily.    [provider]    Family History Family History  Problem Relation Age of Onset   Cirrhosis Mother    Diabetes Mother    Arthritis Mother    Gout Mother    Depression Mother    Bowel Disease Mother    Heart disease Father    Diabetes Father    Heart attack  Father 44       during knee surgery   Valvular heart disease Sister        had valve surgery   Depression Sister    Diabetes Brother    Factor V Leiden deficiency Brother        with blood clots   Hyperlipidemia Son    Cancer Maternal Grandmother 66       colon cancer   Colon cancer Maternal Grandmother    Atrial fibrillation Brother    Valvular heart disease Brother        valve surgery   Hypertension Brother    Esophageal cancer Neg Hx    Rectal cancer Neg Hx    Stomach cancer Neg Hx     Social History Social History   Tobacco Use   Smoking status: Never   Smokeless tobacco: Never  Vaping Use   Vaping status: Never Used  Substance Use Topics   Alcohol use: Yes    Comment: OCC    Drug use: Never     Allergies   Morphine and codeine and Other   Review of Systems Review of Systems  HENT:  Positive for sore throat.   All other systems reviewed and are negative.    Physical Exam Triage Vital Signs ED Triage Vitals  Encounter Vitals Group     BP 11/16/22 0926 (!) 145/84     Systolic BP Percentile --      Diastolic BP Percentile --      Pulse Rate 11/16/22 0926 83     Resp 11/16/22 0926 16     Temp 11/16/22 0926 98.6 F (37 C)     Temp Source 11/16/22 0926 Oral     SpO2 11/16/22 0926 97 %     Weight --      Height --      Head Circumference --      Peak Flow --      Pain Score 11/16/22 0927 10     Pain Loc --      Pain Education --      Exclude from Growth Chart --    No data found.  Updated Vital Signs BP (!) 145/84 (BP Location: Left Arm)   Pulse 83   Temp 98.6 F (37 C) (Oral)   Resp 16   SpO2 97%      Physical Exam Vitals and nursing note reviewed.  Constitutional:      Appearance: Normal appearance. She is normal weight.  HENT:     Head: Normocephalic and atraumatic.     Right Ear: Tympanic membrane, ear canal and external ear normal.     Left Ear: Tympanic membrane, ear canal and external ear normal.     Mouth/Throat:      Mouth: Mucous membranes are moist.     Pharynx: Oropharynx is clear.  Eyes:     Extraocular Movements: Extraocular movements intact.     Conjunctiva/sclera: Conjunctivae normal.     Pupils: Pupils are equal, round, and reactive to light.  Cardiovascular:     Rate and Rhythm: Normal rate and regular rhythm.     Pulses: Normal pulses.     Heart sounds: Normal heart sounds.  Pulmonary:     Effort: Pulmonary effort is normal.     Breath sounds: Normal breath sounds. No wheezing, rhonchi or rales.  Musculoskeletal:        General: Normal range of motion.     Cervical back: Normal range of motion and neck supple.  Skin:    General: Skin is warm and dry.  Neurological:     General: No focal deficit present.     Mental Status: She is alert and oriented to person, place, and time. Mental status is at baseline.  Psychiatric:        Mood and Affect: Mood normal.        Behavior: Behavior normal.        Thought Content: Thought content normal.      UC Treatments / Results  Labs (all labs ordered are listed, but only abnormal results are displayed) Labs Reviewed  CULTURE, GROUP A STREP [facility])  POCT RAPID STREP A (OFFICE)    EKG   Radiology No results found.  Procedures Procedures (including critical care time)  Medications Ordered in UC Medications - No data to display  Initial Impression / Assessment and Plan / UC Course  I have reviewed the triage vital signs and the nursing notes.  Pertinent labs & imaging results that were available during my care of the patient were reviewed by me and considered in my medical decision making (see chart for details).     MDM: 1.  Acute pharyngitis, unspecified etiology-Rx'd Zithromax (500 mg day 1, then 250 mg daily x 5 days), rapid strep negative, throat culture ordered; 2.  Sore throat-Rx'd prednisone 60 mg daily x 5 days. Advised patient to take medications as directed with food to completion.  Encouraged increase daily water intake  to 64 ounces per day while taking these medications.  Advised we will follow-up with throat culture results once received.  Advised if symptoms worsen and/or unresolved please follow-up with PCP or here for further evaluation.  Patient discharged home, hemodynamically stable.  Final Clinical Impressions(s) / UC Diagnoses   Final diagnoses:  Acute pharyngitis, unspecified etiology  Sore throat     Discharge Instructions      Advised patient to take medications as directed with food to completion.  Encouraged increase daily water intake to 64 ounces per day while taking these medications.  Advised we will follow-up with throat culture results once received.  Advised if symptoms worsen and/or unresolved please follow-up with PCP or here for further evaluation.     ED Prescriptions     Medication Sig Dispense Auth. Provider   azithromycin (ZITHROMAX) 250 MG tablet Take 1 tablet (250 mg total) by mouth daily. Take first 2 tablets  together, then 1 every day until finished. 6 tablet Trevor Iha, FNP   predniSONE (DELTASONE) 20 MG tablet Take 3 tabs PO daily x 5 days. 15 tablet Trevor Iha, FNP      PDMP not reviewed this encounter.   Trevor Iha, FNP 11/16/22 1049

## 2022-11-16 NOTE — ED Triage Notes (Signed)
Sore throat since saturday

## 2022-11-16 NOTE — Discharge Instructions (Addendum)
Advised patient to take medications as directed with food to completion.  Encouraged increase daily water intake to 64 ounces per day while taking these medications.  Advised we will follow-up with throat culture results once received.  Advised if symptoms worsen and/or unresolved please follow-up with PCP or here for further evaluation.

## 2022-12-22 DIAGNOSIS — D225 Melanocytic nevi of trunk: Secondary | ICD-10-CM | POA: Diagnosis not present

## 2022-12-22 DIAGNOSIS — L814 Other melanin hyperpigmentation: Secondary | ICD-10-CM | POA: Diagnosis not present

## 2022-12-22 DIAGNOSIS — D485 Neoplasm of uncertain behavior of skin: Secondary | ICD-10-CM | POA: Diagnosis not present

## 2022-12-22 DIAGNOSIS — L710 Perioral dermatitis: Secondary | ICD-10-CM | POA: Diagnosis not present

## 2022-12-22 DIAGNOSIS — L578 Other skin changes due to chronic exposure to nonionizing radiation: Secondary | ICD-10-CM | POA: Diagnosis not present

## 2023-01-02 DIAGNOSIS — M5451 Vertebrogenic low back pain: Secondary | ICD-10-CM | POA: Diagnosis not present

## 2023-01-04 ENCOUNTER — Other Ambulatory Visit: Payer: Self-pay | Admitting: Orthopedic Surgery

## 2023-01-04 ENCOUNTER — Telehealth: Payer: Self-pay | Admitting: Family

## 2023-01-04 DIAGNOSIS — M545 Low back pain, unspecified: Secondary | ICD-10-CM

## 2023-01-19 DIAGNOSIS — S52572A Other intraarticular fracture of lower end of left radius, initial encounter for closed fracture: Secondary | ICD-10-CM | POA: Diagnosis not present

## 2023-01-19 DIAGNOSIS — M25532 Pain in left wrist: Secondary | ICD-10-CM | POA: Diagnosis not present

## 2023-01-24 ENCOUNTER — Encounter: Payer: Self-pay | Admitting: Family

## 2023-01-24 ENCOUNTER — Ambulatory Visit (INDEPENDENT_AMBULATORY_CARE_PROVIDER_SITE_OTHER): Payer: Medicare Other | Admitting: Family

## 2023-01-24 VITALS — BP 120/70 | HR 75 | Resp 18 | Ht 66.5 in | Wt 173.4 lb

## 2023-01-24 DIAGNOSIS — R5383 Other fatigue: Secondary | ICD-10-CM

## 2023-01-24 DIAGNOSIS — L659 Nonscarring hair loss, unspecified: Secondary | ICD-10-CM | POA: Diagnosis not present

## 2023-01-24 LAB — COMPREHENSIVE METABOLIC PANEL
ALT: 22 U/L (ref 0–35)
AST: 18 U/L (ref 0–37)
Albumin: 4.3 g/dL (ref 3.5–5.2)
Alkaline Phosphatase: 56 U/L (ref 39–117)
BUN: 17 mg/dL (ref 6–23)
CO2: 27 meq/L (ref 19–32)
Calcium: 9.5 mg/dL (ref 8.4–10.5)
Chloride: 106 meq/L (ref 96–112)
Creatinine, Ser: 0.73 mg/dL (ref 0.40–1.20)
GFR: 86.1 mL/min (ref >60.00)
Glucose, Bld: 104 mg/dL — ABNORMAL HIGH (ref 70–99)
Potassium: 4 meq/L (ref 3.5–5.1)
Sodium: 143 meq/L (ref 135–145)
Total Bilirubin: 0.9 mg/dL (ref 0.2–1.2)
Total Protein: 6.3 g/dL (ref 6.0–8.3)

## 2023-01-24 LAB — CBC WITH DIFFERENTIAL/PLATELET
Basophils Absolute: 0 10*3/uL (ref 0.0–0.1)
Basophils Relative: 1 % (ref 0.0–3.0)
Eosinophils Absolute: 0.2 10*3/uL (ref 0.0–0.7)
Eosinophils Relative: 3.4 % (ref 0.0–5.0)
HCT: 42 % (ref 36.0–46.0)
Hemoglobin: 13.7 g/dL (ref 12.0–15.0)
Lymphocytes Relative: 31.2 % (ref 12.0–46.0)
Lymphs Abs: 1.4 10*3/uL (ref 0.7–4.0)
MCHC: 32.6 g/dL (ref 30.0–36.0)
MCV: 92.6 fL (ref 78.0–100.0)
Monocytes Absolute: 0.4 10*3/uL (ref 0.1–1.0)
Monocytes Relative: 8.7 % (ref 3.0–12.0)
Neutro Abs: 2.5 10*3/uL (ref 1.4–7.7)
Neutrophils Relative %: 55.7 % (ref 43.0–77.0)
Platelets: 237 10*3/uL (ref 150.0–400.0)
RBC: 4.53 Mil/uL (ref 3.87–5.11)
RDW: 12.7 % (ref 11.5–15.5)
WBC: 4.6 10*3/uL (ref 4.0–10.5)

## 2023-01-24 LAB — TSH: TSH: 1.64 u[IU]/mL (ref 0.35–5.50)

## 2023-01-24 LAB — HEMOGLOBIN A1C: Hgb A1c MFr Bld: 5.7 % (ref 4.6–6.5)

## 2023-01-24 NOTE — Progress Notes (Signed)
Samantha Mckinney is a 67 y.o. 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     carvedilol (COREG) 6.25 MG tablet Take 6.25 mg in the morning and  3.125 mg ( 0.5 tablet) in the evening 180 tablet 3   Cholecalciferol (D3 PO) Take 4,000 mg by mouth daily.     clotrimazole-betamethasone (LOTRISONE) cream Apply 1 application. topically 2 (two) times daily. 30 g 0   cyclobenzaprine (FLEXERIL) 10 MG tablet Take 10 mg by mouth 3 (three) times daily as needed for muscle spasms.     magnesium oxide (MAG-OX) 400 MG tablet Take 400 mg by mouth daily.     pantoprazole (PROTONIX) 40 MG tablet TAKE 1 TABLET BY MOUTH DAILY 90 tablet 0   TURMERIC PO turmeric     vitamin C (ASCORBIC ACID) 500 MG tablet Take 500 mg by mouth daily.     zinc gluconate 50 MG tablet Take 50 mg by mouth daily.     No current facility-administered medications for this visit.    Allergies: Morphine and codeine and Other  Past Medical History:  Diagnosis Date   Allergy    Cataract    Colon polyps    Diverticulitis    Elevated cholesterol    Endometriosis 02/02/2017    Factor V Leiden (HCC) 02/02/2017   GERD (gastroesophageal reflux disease)    IBS (irritable bowel syndrome)    Liver hemangioma 02/02/2017   Low back pain    Lumbar radiculopathy    NASH (nonalcoholic steatohepatitis) 02/02/2017   Scoliosis    Spinal stenosis of lumbar region     Past Surgical History:  Procedure Laterality Date   ABDOMINAL HYSTERECTOMY  1998   TOTAL, on estrogen  until 2012   BACK SURGERY  2000   disc removed L4L5   CHOLECYSTECTOMY     COLONOSCOPY     ESOPHAGOGASTRODUODENOSCOPY  2014   In [place]   HEMORRHOID SURGERY  1996   LAPAROSCOPIC ENDOMETRIOSIS FULGURATION     x 6, 1980-1991   UPPER GASTROINTESTINAL ENDOSCOPY      Family History  Problem Relation Age of Onset   Cirrhosis Mother    Diabetes Mother    Arthritis Mother    Gout Mother    Depression Mother    Bowel Disease Mother    Heart disease Father    Diabetes Father    Heart attack Father 46       during knee surgery   Valvular heart disease Sister        had valve surgery   Depression Sister    Diabetes Brother    Factor V  Leiden deficiency Brother        with blood clots   Hyperlipidemia Son    Cancer Maternal Grandmother 28       colon cancer   Colon cancer Maternal Grandmother    Atrial fibrillation Brother    Valvular heart disease Brother        valve surgery   Hypertension Brother    Esophageal cancer Neg Hx    Rectal cancer Neg Hx    Stomach cancer Neg Hx     Social History   Tobacco Use   Smoking status: Never   Smokeless tobacco: Never  Substance Use Topics   Alcohol use: Yes    Comment: OCC     Subjective:   Requesting labs today to rule out thyroid condition and/ or diabetes; she is concerned about worsening hair loss and fatigue; notes that symptoms have been present for the past 4 months; has been checking her blood sugar recently- highest reading has been 123; took oral prednisone in June and required cortisone shots in left hip in June/ July;   Objective:   Vitals:   01/24/23 0831  BP: 120/70  Pulse: 75  Resp: 18  SpO2: 99%  Weight: 173 lb 6.4 oz (78.7 kg)  Height: 5' 6.5" (1.689 m)    General: Well developed, well nourished, in no acute distress  Skin : Warm and dry.  Head: Normocephalic and atraumatic  Lungs: Respirations unlabored; clear to auscultation bilaterally without wheeze, rales, rhonchi  CVS exam: normal rate and regular rhythm.  Neurologic: Alert and oriented; speech intact; face symmetrical; moves all extremities well; CNII-XII intact without focal deficit   Assessment:  1. Other fatigue   2. Hair loss     Plan:  Will update labs today; follow up to be determined;  She will consider discussing hair loss issues with her dermatologist/ ? If her Coreg could be contributing as well;   No follow-ups on file.  Orders Placed This Encounter  Procedures   CBC with Differential/Platelet   Comp Met (CMET)   Hemoglobin A1c   TSH    Requested Prescriptions    No prescriptions requested or ordered in this encounter

## 2023-01-25 DIAGNOSIS — M7632 Iliotibial band syndrome, left leg: Secondary | ICD-10-CM | POA: Diagnosis not present

## 2023-01-25 DIAGNOSIS — M5459 Other low back pain: Secondary | ICD-10-CM | POA: Diagnosis not present

## 2023-01-25 DIAGNOSIS — M25552 Pain in left hip: Secondary | ICD-10-CM | POA: Diagnosis not present

## 2023-01-26 DIAGNOSIS — S52572A Other intraarticular fracture of lower end of left radius, initial encounter for closed fracture: Secondary | ICD-10-CM | POA: Diagnosis not present

## 2023-01-26 DIAGNOSIS — M546 Pain in thoracic spine: Secondary | ICD-10-CM | POA: Diagnosis not present

## 2023-02-02 DIAGNOSIS — M5442 Lumbago with sciatica, left side: Secondary | ICD-10-CM | POA: Diagnosis not present

## 2023-02-02 DIAGNOSIS — M546 Pain in thoracic spine: Secondary | ICD-10-CM | POA: Diagnosis not present

## 2023-02-03 DIAGNOSIS — S52572A Other intraarticular fracture of lower end of left radius, initial encounter for closed fracture: Secondary | ICD-10-CM | POA: Diagnosis not present

## 2023-02-07 NOTE — Progress Notes (Unsigned)
Established patient visit   Patient: Samantha Mckinney   DOB: Jan 15, 1956   67 y.o. Female  MRN: [id number] Visit Date: 02/08/2023  Today's healthcare provider: Alfredia Ferguson, PA-C   No chief complaint on file.  Subjective     *** She is status post ground-level fall on 01/19/2023 in which she sustained a thoracic compression fracture. This is a stable injury and we will plan to treat non-operatively. The patient may be activity as tolerated with no excessive bending, twisting, or lifting. No bracing needed. We will control their pain utilizing multimodal pain control. We had a long discussion regarding the role of osteoporosis in this injury and the importance of following up with their PCP or endocrinologist for bone health work up and initiation of appropriate therapy to help prevent future fragility fractures. She reports that she has a DEXA scan scheduled for December of this year.  Medications: Outpatient Medications Prior to Visit  Medication Sig   B Complex Vitamins (VITAMIN B COMPLEX PO) vitamin B complex   BIOTIN 5000 PO Take by mouth.   carvedilol (COREG) 6.25 MG tablet Take 6.25 mg in the morning and  3.125 mg ( 0.5 tablet) in the evening   Cholecalciferol (D3 PO) Take 4,000 mg by mouth daily.   clotrimazole-betamethasone (LOTRISONE) cream Apply 1 application. topically 2 (two) times daily.   cyclobenzaprine (FLEXERIL) 10 MG tablet Take 10 mg by mouth 3 (three) times daily as needed for muscle spasms.   magnesium oxide (MAG-OX) 400 MG tablet Take 400 mg by mouth daily.   pantoprazole (PROTONIX) 40 MG tablet TAKE 1 TABLET BY MOUTH DAILY   TURMERIC PO turmeric   vitamin C (ASCORBIC ACID) 500 MG tablet Take 500 mg by mouth daily.   zinc gluconate 50 MG tablet Take 50 mg by mouth daily.   No facility-administered medications prior to visit.    Review of Systems {Insert previous labs (optional):23779} {See past labs  Heme  Chem  Endocrine  Serology  Results Review  (optional):1}   Objective    There were no vitals taken for this visit. {Insert last BP/Wt (optional):23777}{See vitals history (optional):1}  Physical Exam  ***  No results found for any visits on 02/08/23.  Assessment & Plan    There are no diagnoses linked to this encounter.  ***  No follow-ups on file.       Alfredia Ferguson, PA-C  [facility] at [facility] [phone] (phone) [phone] (fax)  [facility]

## 2023-02-08 ENCOUNTER — Ambulatory Visit (INDEPENDENT_AMBULATORY_CARE_PROVIDER_SITE_OTHER): Payer: Medicare Other | Admitting: Physician Assistant

## 2023-02-08 ENCOUNTER — Encounter: Payer: Self-pay | Admitting: Physician Assistant

## 2023-02-08 VITALS — BP 120/81 | HR 72 | Temp 98.1°F | Ht 66.5 in | Wt 172.5 lb

## 2023-02-08 DIAGNOSIS — S22000A Wedge compression fracture of unspecified thoracic vertebra, initial encounter for closed fracture: Secondary | ICD-10-CM

## 2023-02-09 ENCOUNTER — Ambulatory Visit: Payer: Medicare Other | Admitting: Family Medicine

## 2023-02-16 ENCOUNTER — Encounter: Payer: Self-pay | Admitting: Family Medicine

## 2023-02-16 ENCOUNTER — Ambulatory Visit (INDEPENDENT_AMBULATORY_CARE_PROVIDER_SITE_OTHER): Payer: Medicare Other | Admitting: Family Medicine

## 2023-02-16 VITALS — BP 126/66 | HR 77 | Ht 66.5 in | Wt 172.0 lb

## 2023-02-16 DIAGNOSIS — I479 Paroxysmal tachycardia, unspecified: Secondary | ICD-10-CM

## 2023-02-16 DIAGNOSIS — K7581 Nonalcoholic steatohepatitis (NASH): Secondary | ICD-10-CM

## 2023-02-16 DIAGNOSIS — K5909 Other constipation: Secondary | ICD-10-CM | POA: Insufficient documentation

## 2023-02-16 NOTE — Assessment & Plan Note (Signed)
Pt has hx of constipation and is on miralax and fiber metamucil gummies and doing well - we did discuss linzess but she is doing well with her regimen so we will continue this

## 2023-02-16 NOTE — Progress Notes (Addendum)
New patient visit   Patient: Samantha Mckinney   DOB: [birth year]   67 y.o. Female  MRN: [id number] Visit Date: 02/16/2023  Today's healthcare provider: Charlton Amor, DO   Chief Complaint  Patient presents with   New Patient (Initial Visit)    Establish Care    SUBJECTIVE    Chief Complaint  Patient presents with   New Patient (Initial Visit)    Establish Care   HPI HPI     New Patient (Initial Visit)    Additional comments: Establish Care      Last edited by Roselyn Reef, CMA on 02/16/2023  1:09 PM.      Pt presents to establish care.   Has hx of wrist fracture and compound back fx  - does have DEXA planned for december  Review of Systems  Constitutional:  Negative for activity change, fatigue and fever.  Respiratory:  Negative for cough and shortness of breath.   Cardiovascular:  Negative for chest pain.  Gastrointestinal:  Negative for abdominal pain.  Genitourinary:  Negative for difficulty urinating.       Current Meds  Medication Sig   B Complex Vitamins (VITAMIN B COMPLEX PO) vitamin B complex   BIOTIN 5000 PO Take by mouth.   carvedilol (COREG) 6.25 MG tablet Take 6.25 mg in the morning and  3.125 mg ( 0.5 tablet) in the evening   Cholecalciferol (D3 PO) Take 4,000 mg by mouth daily.   clotrimazole-betamethasone (LOTRISONE) cream Apply 1 application. topically 2 (two) times daily.   cyclobenzaprine (FLEXERIL) 10 MG tablet Take 10 mg by mouth 3 (three) times daily as needed for muscle spasms.   magnesium oxide (MAG-OX) 400 MG tablet Take 400 mg by mouth daily.   pantoprazole (PROTONIX) 40 MG tablet TAKE 1 TABLET BY MOUTH DAILY   TURMERIC PO turmeric   vitamin C (ASCORBIC ACID) 500 MG tablet Take 500 mg by mouth daily.   zinc gluconate 50 MG tablet Take 50 mg by mouth daily.    OBJECTIVE    BP 126/66 (BP Location: Right Arm, Patient Position: Sitting, Cuff Size: Normal)   Pulse 77   Ht 5' 6.5" (1.689 m)   Wt 172 lb (78 kg)   SpO2 100%   BMI  27.35 kg/m   Physical Exam Vitals and nursing note reviewed.  Constitutional:      General: She is not in acute distress.    Appearance: Normal appearance.  HENT:     Head: Normocephalic and atraumatic.     Right Ear: External ear normal.     Left Ear: External ear normal.     Nose: Nose normal.  Eyes:     Conjunctiva/sclera: Conjunctivae normal.  Cardiovascular:     Rate and Rhythm: Normal rate and regular rhythm.  Pulmonary:     Effort: Pulmonary effort is normal.     Breath sounds: Normal breath sounds.  Abdominal:     General: Bowel sounds are normal.     Palpations: Abdomen is soft. There is no mass.     Tenderness: There is no abdominal tenderness.  Neurological:     General: No focal deficit present.     Mental Status: She is alert and oriented to person, place, and time.  Psychiatric:        Mood and Affect: Mood normal.        Behavior: Behavior normal.        Thought Content: Thought content normal.  Judgment: Judgment normal.        ASSESSMENT & PLAN    Problem List Items Addressed This Visit       Cardiovascular and Mediastinum   Paroxysmal tachycardia (HCC) - Primary (Chronic)    Doing well on carvedilol, follows with cards         Digestive   NASH (nonalcoholic steatohepatitis)    Pt has hx of NASh and mom passed away from this - discussed wegovy treatment at the first of the year for weight loss - she will follow up in January and we will start the PA process      Chronic constipation    Pt has hx of constipation and is on miralax and fiber metamucil gummies and doing well - we did discuss linzess but she is doing well with her regimen so we will continue this       Return in about 10 weeks (around 04/27/2023).      No orders of the defined types were placed in this encounter.   No orders of the defined types were placed in this encounter.    Charlton Amor, DO  [facility] at [facility] [phone] (phone) [phone] (fax)  [facility]

## 2023-02-16 NOTE — Assessment & Plan Note (Signed)
Pt has hx of NASh and mom passed away from this - discussed wegovy treatment at the first of the year for weight loss - she will follow up in January and we will start the PA process

## 2023-02-16 NOTE — Assessment & Plan Note (Signed)
Doing well on carvedilol, follows with cards

## 2023-02-17 ENCOUNTER — Encounter: Payer: Self-pay | Admitting: Family Medicine

## 2023-02-20 DIAGNOSIS — S52572A Other intraarticular fracture of lower end of left radius, initial encounter for closed fracture: Secondary | ICD-10-CM | POA: Diagnosis not present

## 2023-02-21 ENCOUNTER — Other Ambulatory Visit: Payer: Self-pay | Admitting: Family Medicine

## 2023-02-21 DIAGNOSIS — R7989 Other specified abnormal findings of blood chemistry: Secondary | ICD-10-CM

## 2023-02-23 ENCOUNTER — Ambulatory Visit: Payer: Medicare Other | Admitting: Cardiology

## 2023-03-01 DIAGNOSIS — R7989 Other specified abnormal findings of blood chemistry: Secondary | ICD-10-CM | POA: Diagnosis not present

## 2023-03-02 LAB — IRON,TIBC AND FERRITIN PANEL
Ferritin: 192 ng/mL — ABNORMAL HIGH (ref 15–150)
Iron Saturation: 25 % (ref 15–55)
Iron: 79 ug/dL (ref 27–139)
Total Iron Binding Capacity: 315 ug/dL (ref 250–450)
UIBC: 236 ug/dL (ref 118–369)

## 2023-03-06 DIAGNOSIS — S52502A Unspecified fracture of the lower end of left radius, initial encounter for closed fracture: Secondary | ICD-10-CM | POA: Diagnosis not present

## 2023-03-07 DIAGNOSIS — M546 Pain in thoracic spine: Secondary | ICD-10-CM | POA: Diagnosis not present

## 2023-03-20 ENCOUNTER — Ambulatory Visit: Payer: Medicare Other | Admitting: Cardiology

## 2023-03-20 NOTE — Progress Notes (Unsigned)
  Cardiology Office Note:  .   Date:  03/23/2023  ID:  [name], DOB [birth year], MRN [id number] PCP: Charlton Amor, DO  [facility] Providers Cardiologist:  Bryan Lemma, MD \\}  History of Present Illness: .   Samantha Mckinney is a 67 y.o. female with h/o paroxysmal tachycardia, on coreg 6.25 in am and 3.125 mg in pm, HTN, Factor V Leiden, HL, and chronic lumbar spine disease with radiculopathy. Upper extremity doppler revealed no evidence of obstruction for evaluation of upper extremity edema.   Samantha Mckinney has retired in May 2024 and since that time her heart rate has been very well-regulated.  In fact she is decreased her dose of carvedilol from 6.125 mg in the morning and 3.125 mg at night to only taking 3.125 mg twice daily.  She is much calmer does not feel a whole lot of heart racing or palpitations.  She has a history of NASH as being followed by her primary care who would like to put her on SGLT 2 inhibitors, as she reports this is helpful and progression of this disease.  The patient is concerned about being able to take this medication along with her cardiac medications.  She fell and fractured her left wrist is now wearing a brace.  She denies chest pain, shortness of breath, dizziness, or profound fatigue.  ROS: As above otherwise negative  Studies Reviewed: Marland Kitchen   EKG Interpretation Date/Time:  Thursday March 23 2023 09:50:34 EST Ventricular Rate:  61 PR Interval:  104 QRS Duration:  74 QT Interval:  424 QTC Calculation: 426 R Axis:   21  Text Interpretation: Sinus rhythm with short PR When compared with ECG of 28-Nov-2020 14:15, PREVIOUS ECG IS PRESENT Confirmed by Joni Reining [phone]) on 03/23/2023 9:57:28 AM    Physical Exam:   VS:  BP 128/86   Pulse 78   Ht 5' 6.25" (1.683 m)   Wt 171 lb 6.4 oz (77.7 kg)   SpO2 99%   BMI 27.46 kg/m    Wt Readings from Last 3 Encounters:  03/23/23 171 lb 6.4 oz (77.7 kg)  02/16/23 172 lb (78 kg)  02/08/23 172 lb 8  oz (78.2 kg)    GEN: Well nourished, well developed in no acute distress NECK: No JVD; No carotid bruits CARDIAC: RRR, no murmurs, rubs, gallops RESPIRATORY:  Clear to auscultation without rales, wheezing or rhonchi  ABDOMEN: Soft, non-tender, non-distended EXTREMITIES:  No edema; No deformity wearing brace to her left arm.  ASSESSMENT AND PLAN: .    Paroxysmal sinus tachycardia: Much better since retiring May 2024.  She is decreased her carvedilol to 3.125 mg twice daily.  Will write her a new prescription for same.  Heart rate is well-controlled.  No other changes to her medication.  Labs are followed by PCP.  2.  NASH: Followed by PCP no cardiac contraindications for taking SGLT2 inhibition injections.  3.  Hypercholesterolemia: Currently not on any statin therapy.  She prefers not to be on statin therapy and is willing to use the weight loss medications to see if this would help her regulate her cholesterol status.  Follow-up labs are good to be followed by PCP.         Signed, Bettey Mare. Liborio Nixon, ANP, AACC

## 2023-03-23 ENCOUNTER — Ambulatory Visit: Payer: Medicare Other | Attending: Adult Health | Admitting: Adult Health

## 2023-03-23 ENCOUNTER — Encounter: Payer: Self-pay | Admitting: Adult Health

## 2023-03-23 VITALS — BP 128/86 | HR 78 | Ht 66.25 in | Wt 171.4 lb

## 2023-03-23 DIAGNOSIS — K7581 Nonalcoholic steatohepatitis (NASH): Secondary | ICD-10-CM | POA: Diagnosis not present

## 2023-03-23 DIAGNOSIS — I4719 Other supraventricular tachycardia: Secondary | ICD-10-CM | POA: Diagnosis not present

## 2023-03-23 DIAGNOSIS — I10 Essential (primary) hypertension: Secondary | ICD-10-CM

## 2023-03-23 DIAGNOSIS — M25632 Stiffness of left wrist, not elsewhere classified: Secondary | ICD-10-CM | POA: Diagnosis not present

## 2023-03-23 DIAGNOSIS — S52502D Unspecified fracture of the lower end of left radius, subsequent encounter for closed fracture with routine healing: Secondary | ICD-10-CM | POA: Diagnosis not present

## 2023-03-23 MED ORDER — CARVEDILOL 3.125 MG PO TABS: 3.1250 mg | ORAL_TABLET | Freq: Two times a day (BID) | ORAL | 3 refills | Status: DC

## 2023-03-23 NOTE — Patient Instructions (Signed)
Medication Instructions:  Decrease Coreg to 3.125 mg ( Take 1 Tablet Twice Daily). *If you need a refill on your cardiac medications before your next appointment, please call your pharmacy*   Lab Work: No Labs If you have labs (blood work) drawn today and your tests are completely normal, you will receive your results only by: MyChart Message (if you have MyChart) OR A paper copy in the mail If you have any lab test that is abnormal or we need to change your treatment, we will call you to review the results.   Testing/Procedures: No Testing   Follow-Up: At [facility], you and your health needs are our priority.  As part of our continuing mission to provide you with exceptional heart care, we have created designated Provider Care Teams.  These Care Teams include your primary Cardiologist (physician) and Advanced Practice Providers (APPs -  Physician Assistants and Nurse Practitioners) who all work together to provide you with the care you need, when you need it.  We recommend signing up for the patient portal called "MyChart".  Sign up information is provided on this After Visit Summary.  MyChart is used to connect with patients for Virtual Visits (Telemedicine).  Patients are able to view lab/test results, encounter notes, upcoming appointments, etc.  Non-urgent messages can be sent to your provider as well.   To learn more about what you can do with MyChart, go to [email or web address].    Your next appointment:   6 month(s)  Provider:   Bryan Lemma, MD

## 2023-03-24 ENCOUNTER — Ambulatory Visit
Admission: RE | Admit: 2023-03-24 | Discharge: 2023-03-24 | Disposition: A | Discharge status: Home / Self Care | Payer: Medicare Other | Source: Ambulatory Visit | Attending: Family | Admitting: Family

## 2023-03-24 DIAGNOSIS — M8588 Other specified disorders of bone density and structure, other site: Secondary | ICD-10-CM | POA: Diagnosis not present

## 2023-03-24 DIAGNOSIS — E2839 Other primary ovarian failure: Secondary | ICD-10-CM | POA: Diagnosis not present

## 2023-03-24 DIAGNOSIS — N958 Other specified menopausal and perimenopausal disorders: Secondary | ICD-10-CM | POA: Diagnosis not present

## 2023-03-24 DIAGNOSIS — Z13820 Encounter for screening for osteoporosis: Secondary | ICD-10-CM

## 2023-03-31 DIAGNOSIS — M25632 Stiffness of left wrist, not elsewhere classified: Secondary | ICD-10-CM | POA: Diagnosis not present

## 2023-03-31 DIAGNOSIS — S52502D Unspecified fracture of the lower end of left radius, subsequent encounter for closed fracture with routine healing: Secondary | ICD-10-CM | POA: Diagnosis not present

## 2023-04-04 DIAGNOSIS — M25632 Stiffness of left wrist, not elsewhere classified: Secondary | ICD-10-CM | POA: Diagnosis not present

## 2023-04-04 DIAGNOSIS — S52502D Unspecified fracture of the lower end of left radius, subsequent encounter for closed fracture with routine healing: Secondary | ICD-10-CM | POA: Diagnosis not present

## 2023-04-04 DIAGNOSIS — M65331 Trigger finger, right middle finger: Secondary | ICD-10-CM | POA: Diagnosis not present

## 2023-04-13 DIAGNOSIS — M25632 Stiffness of left wrist, not elsewhere classified: Secondary | ICD-10-CM | POA: Diagnosis not present

## 2023-04-13 DIAGNOSIS — S52502D Unspecified fracture of the lower end of left radius, subsequent encounter for closed fracture with routine healing: Secondary | ICD-10-CM | POA: Diagnosis not present

## 2023-04-27 ENCOUNTER — Ambulatory Visit (INDEPENDENT_AMBULATORY_CARE_PROVIDER_SITE_OTHER): Payer: Medicare (Managed Care) | Admitting: Family Medicine

## 2023-04-27 ENCOUNTER — Encounter: Payer: Self-pay | Admitting: Family Medicine

## 2023-04-27 VITALS — BP 107/72 | HR 63 | Ht 66.25 in | Wt 176.8 lb

## 2023-04-27 DIAGNOSIS — K7581 Nonalcoholic steatohepatitis (NASH): Secondary | ICD-10-CM

## 2023-04-27 DIAGNOSIS — K769 Liver disease, unspecified: Secondary | ICD-10-CM

## 2023-04-27 MED ORDER — ZEPBOUND 2.5 MG/0.5ML ~~LOC~~ SOAJ: 2.5000 mg | SUBCUTANEOUS | 0 refills | Status: DC

## 2023-04-27 NOTE — Progress Notes (Signed)
 Established patient visit   Patient: Samantha Mckinney   DOB: 01-Oct-1955   68 y.o. Female  MRN: [id number] Visit Date: 04/27/2023  Today's healthcare provider: Bernice GORMAN Juneau, DO   Chief Complaint  Patient presents with   Medical Management of Chronic Issues     Pt would like to discuss Weight management and fatty liver    SUBJECTIVE    Chief Complaint  Patient presents with   Medical Management of Chronic Issues     Pt would like to discuss Weight management and fatty liver   HPI HPI     Medical Management of Chronic Issues    Additional comments:  Pt would like to discuss Weight management and fatty liver      Last edited by Duwaine Riggs, CMA on 04/27/2023 10:30 AM.       Pt presents for follow up for NASH. She was diagnosed with NASH in 2018. Today she provides some imaging records and says she has not had a CT scan in about 3 years.    Review of Systems  Constitutional:  Negative for activity change, fatigue and fever.  Respiratory:  Negative for cough and shortness of breath.   Cardiovascular:  Negative for chest pain.  Gastrointestinal:  Negative for abdominal pain.  Genitourinary:  Negative for difficulty urinating.       Current Meds  Medication Sig   B Complex Vitamins (VITAMIN B COMPLEX PO) vitamin B complex   BIOTIN 5000 PO Take by mouth.   carvedilol  (COREG ) 3.125 MG tablet Take 1 tablet (3.125 mg total) by mouth 2 (two) times daily.   Cholecalciferol (D3 PO) Take 4,000 mg by mouth daily.   clotrimazole -betamethasone  (LOTRISONE ) cream Apply 1 application. topically 2 (two) times daily.   cyclobenzaprine  (FLEXERIL ) 10 MG tablet Take 10 mg by mouth 3 (three) times daily as needed for muscle spasms.   magnesium oxide (MAG-OX) 400 MG tablet Take 400 mg by mouth daily.   pantoprazole  (PROTONIX ) 40 MG tablet TAKE 1 TABLET BY MOUTH DAILY   tirzepatide  (ZEPBOUND ) 2.5 MG/0.5ML Pen Inject 2.5 mg into the skin once a week.   vitamin C (ASCORBIC ACID) 500 MG  tablet Take 500 mg by mouth daily.   zinc gluconate 50 MG tablet Take 50 mg by mouth daily.    OBJECTIVE    BP 107/72 (BP Location: Left Arm, Patient Position: Sitting, Cuff Size: Large)   Pulse 63   Ht 5' 6.25 (1.683 m)   Wt 176 lb 12 oz (80.2 kg)   SpO2 99%   BMI 28.31 kg/m   Physical Exam Vitals reviewed.  Constitutional:      Appearance: She is well-developed.  HENT:     Head: Normocephalic and atraumatic.  Eyes:     Conjunctiva/sclera: Conjunctivae normal.  Cardiovascular:     Rate and Rhythm: Normal rate.  Pulmonary:     Effort: Pulmonary effort is normal.  Skin:    General: Skin is dry.     Coloration: Skin is not pale.  Neurological:     Mental Status: She is alert and oriented to person, place, and time.  Psychiatric:        Behavior: Behavior normal.        ASSESSMENT & PLAN    Problem List Items Addressed This Visit       Digestive   NASH (nonalcoholic steatohepatitis) - Primary   We will go ahead and prescribe zepbound  to see if we can get approval for NASH -  will also refer to hepatology for further management of liver hemanigomas - have ordered repeat CT liver scan. Pt is allergic to contrast so we will do WO contrast       Relevant Medications   tirzepatide  (ZEPBOUND ) 2.5 MG/0.5ML Pen   Other Relevant Orders   Ambulatory referral to Gastroenterology   Other Visit Diagnoses       Liver disease       Relevant Orders   CT ABDOMEN WO CONTRAST   Ambulatory referral to Gastroenterology       No follow-ups on file.      Meds ordered this encounter  Medications   tirzepatide  (ZEPBOUND ) 2.5 MG/0.5ML Pen    Sig: Inject 2.5 mg into the skin once a week.    Dispense:  2 mL    Refill:  0    Orders Placed This Encounter  Procedures   CT ABDOMEN WO CONTRAST    Previous imaging done in [facility] recommends three phase CT scan of the liver    Standing Status:   Future    Expiration Date:   04/26/2024    Preferred imaging location?:   [facility]    If indicated for the ordered procedure, I authorize the administration of oral contrast media per Radiology protocol:   Yes    Does the patient have a contrast media/X-ray dye allergy?:   Yes   Ambulatory referral to Gastroenterology    Referral Priority:   Routine    Referral Type:   Consultation    Referral Reason:   Specialty Services Required    Requested Specialty:   Gastroenterology    Number of Visits Requested:   1     Bernice GORMAN Juneau, DO  [facility] at [facility] [phone] (phone) [phone] (fax)  [facility]

## 2023-04-27 NOTE — Assessment & Plan Note (Addendum)
 We will go ahead and prescribe zepbound to see if we can get approval for NASH - will also refer to hepatology for further management of liver hemanigomas - have ordered repeat CT liver scan. Pt is allergic to contrast so we will do WO contrast

## 2023-05-01 ENCOUNTER — Other Ambulatory Visit: Payer: Self-pay

## 2023-05-01 NOTE — Progress Notes (Signed)
Error. Samantha Mckinney, CMA

## 2023-05-02 ENCOUNTER — Encounter: Payer: Self-pay | Admitting: Family Medicine

## 2023-05-02 ENCOUNTER — Other Ambulatory Visit: Payer: Self-pay | Admitting: Family Medicine

## 2023-05-02 DIAGNOSIS — K7581 Nonalcoholic steatohepatitis (NASH): Secondary | ICD-10-CM

## 2023-05-04 ENCOUNTER — Other Ambulatory Visit: Payer: Self-pay | Admitting: Family

## 2023-05-04 ENCOUNTER — Ambulatory Visit: Payer: PPO

## 2023-05-04 DIAGNOSIS — K769 Liver disease, unspecified: Secondary | ICD-10-CM

## 2023-05-05 ENCOUNTER — Other Ambulatory Visit: Payer: Self-pay | Admitting: Family Medicine

## 2023-05-05 ENCOUNTER — Telehealth: Payer: Self-pay | Admitting: Gastroenterology

## 2023-05-05 DIAGNOSIS — Z1231 Encounter for screening mammogram for malignant neoplasm of breast: Secondary | ICD-10-CM

## 2023-05-05 NOTE — Telephone Encounter (Signed)
Inbound call from patient stating that her PCP had referred her to be seen for NASH. Patient states that she had a CT scan done and that it would need to be reviewed by Dr. Donnetta Hutching. Patient was scheduled for an appointment on 2/17 at 9:30 with Deanna May and is wanting to make sure we have the results and recommendations before her appointment. Please advise.

## 2023-05-08 NOTE — Telephone Encounter (Signed)
Called and spoke with patient. Pt is aware that CT report is not available at this time, once available we will have Dr. Adela Lank review and advise. Pt was informed by radiology that it could take up to 2 weeks before her CT report is available. Pt verbalized understanding and had no concerns at the end of the call.

## 2023-05-09 MED ORDER — PANTOPRAZOLE SODIUM 40 MG PO TBEC: 40.0000 mg | DELAYED_RELEASE_TABLET | Freq: Every day | ORAL | 0 refills | Status: DC

## 2023-05-14 ENCOUNTER — Encounter: Payer: Self-pay | Admitting: Family Medicine

## 2023-05-16 NOTE — Telephone Encounter (Signed)
Deanna, please see message stream below. CT results are in epic, non-urgent. Patient is scheduled to see you on 06/05/23. Thanks

## 2023-06-01 NOTE — Progress Notes (Signed)
Chief Complaint: NASH discussion and pt has hx of liver hemangiomas  Primary GI Doctor: Dr. Adela Lank  HPI: Patient is a  68  year old female patient with past medical history of  chronic constipation, diverticulitis, adenomatous colon polyps, Factor V Leiden mutation, NAFLD, hepatic hemangiomas NAFLD (2014), who was referred to me by Charlton Amor, DO on 05/02/23 for a complaint of NASH discussion and pt has hx of liver hemangiomas  .    On 07/13/2021 patient last seen in GI office for colonoscopy with Dr. Adela Lank for recurrent diverticulitis. Last exam done 12/2017 with some adenomas removed. Was told to repeat in 5 years but she has had multiple episodes of sigmoid diverticulitis since her last exam.  Last episode in January.   Colonoscopy 07/13/21, recall 06/2028 Impression:  - Perianal skin tags found on perianal exam.  - One 3 mm polyp in the cecum, removed with a cold snare. Resected and retrieved.  - Diverticulosis in the sigmoid colon.  - Internal hemorrhoids.  - There was significant looping of the colon.  - The examination was otherwise normal. Path: EGD 11/22/19 Impression:  - Esophagogastric landmarks identified. - Normal esophagus otherwise  - A few benign appearing gastric polyps. Two representative samples resected and retrieved.  - Normal stomach otherwise  - biopsies taken to rule out H pylori  - Normal duodenal bulb and second portion of the duodenum. Biopsied. Path: Colonoscopy 12/25/2017- 2 small polyps < 10 mm   EGD 10/26/2012  - "gastritis and mild esophagitis"  - no further details provided, done in [place], biopsies negative for HP  Imaging:  [place] 12/2012 - fatty liver, suspected hepatic hemangiomas,  CT abdomen 06/29/2016 - hepatic hemangiomas, post chole, diverticulosis US pelvis - 10/11/17 - no inguinal hernia, s/p hysterectomy [place] Abd complete- 02/15/19- 2 hyperechoic liver lesions, potentially representing hemangiomas. CT Abd/pelvis W contrast- 07/26/2019- acute  diverticulitis of the descending/sigmoid colon. Vague area of low density in the left hepatic lobe lateral segment measuring 1.4 x 1.1 cm. MR Abd W WO contrast- 08/31/2019- The 2 lesions of concern within the posterior right hepatic lobe and lateral segment of left lobe are technically too small to reliably characterize. Favor benign cyst or biliary hamartoma. Within the limitation of their small size no suspicious imaging features are identified at this time without convincing evidence for enhancement CT Abd/pelvis WO contrast--03/23/22--No acute intra-abdominal or pelvic pathology. Sigmoid diverticulosis. No bowel obstruction. Normal appendix. CT Abdomen WO contrast --05/04/23 -- Subtle hypoattenuating lesions within the liver measuring up to 1.3 cm, unchanged when compared to the prior CT from 2023. These are favored to represent benign lesions such as hemangiomas, hamartomas, or cysts. New mild superior endplate compression deformity at T10, likely subacute. Correlate with point tenderness.   Interval History      Patient presents today with questions about her history of fatty liver disease. We reviewed her imaging from 2006 to present. Her most recent CT scan shows no change to benign lesions since 2023. She tells me she has been exercising regularly and eating healthy. They discussed her starting Zepbound, however she states she decided not to start it due to out of pocket costs. She does tell me she is taking a new supplement called Relief Factor with tumeric for her joints.     Her second complaint is of chronic idiopathic constipation. She has used OTC Miralax and Smooth Move tea prn. She reports the Miralax gives her abdominal cramping so she cannot tolerate it daily. She  also increased her overall fiber intake she also causes her to bloat and have abdominal pain. The Smooth move tea helps get her bowels moving every 3 days or so but she reports the stools are small and hard. No blood in stool. She  has sevre bloating when she is more constipated.     Lastly, patient has history of GERD and taking Pantoprazole 40 mg po daily every other night  Patient denies dysphagia.  Wt Readings from Last 3 Encounters:  06/05/23 177 lb 8 oz (80.5 kg)  04/27/23 176 lb 12 oz (80.2 kg)  03/23/23 171 lb 6.4 oz (77.7 kg)    Past Medical History:  Diagnosis Date   Allergy    Arthritis    lower back   Cataract    Colon polyps    Diverticulitis    Elevated cholesterol    Endometriosis 02/02/2017   Factor V Leiden (HCC) 02/02/2017   GERD (gastroesophageal reflux disease)    IBS (irritable bowel syndrome)    Liver hemangioma 02/02/2017   Low back pain    Lumbar radiculopathy    NASH (nonalcoholic steatohepatitis) 02/02/2017   Scoliosis    Spinal stenosis of lumbar region     Past Surgical History:  Procedure Laterality Date   ABDOMINAL HYSTERECTOMY  1998   TOTAL, on estrogen  until 2012   BACK SURGERY  2000   disc removed L4L5   CHOLECYSTECTOMY     COLONOSCOPY     ESOPHAGOGASTRODUODENOSCOPY  2014   In [place]   EYE SURGERY     Cataracts   HEMORRHOID SURGERY  1996   LAPAROSCOPIC ENDOMETRIOSIS FULGURATION     x 6, 1980-1991   UPPER GASTROINTESTINAL ENDOSCOPY      Current Outpatient Medications  Medication Sig Dispense Refill   aspirin EC 81 MG tablet Take 81 mg by mouth as needed.     B Complex Vitamins (VITAMIN B COMPLEX PO) vitamin B complex     carvedilol (COREG) 3.125 MG tablet Take 1 tablet (3.125 mg total) by mouth 2 (two) times daily. 180 tablet 3   Cholecalciferol (D3 PO) Take 4,000 mg by mouth daily.     clotrimazole-betamethasone (LOTRISONE) cream Apply 1 application. topically 2 (two) times daily. 30 g 0   linaclotide (LINZESS) 72 MCG capsule Take 1 capsule (72 mcg total) by mouth daily before breakfast. 30 capsule 5   magnesium oxide (MAG-OX) 400 (240 Mg) MG tablet Take 400 mg by mouth daily.     magnesium oxide (MAG-OX) 400 MG tablet Take 400 mg by mouth daily.      metroNIDAZOLE (METROCREAM) 0.75 % cream Apply 1 Application topically as needed.     NON FORMULARY Smooth Move  1 cup of tea by mouth as needed     OMEGA-3 FATTY ACIDS PO Take 1 capsule by mouth daily.     pantoprazole (PROTONIX) 40 MG tablet Take 1 tablet (40 mg total) by mouth daily. (Patient taking differently: Take 40 mg by mouth every other day.) 90 tablet 0   polyethylene glycol (MIRALAX / GLYCOLAX) 17 g packet Take 17 g by mouth as needed.     tirzepatide (ZEPBOUND) 2.5 MG/0.5ML Pen Inject 2.5 mg into the skin once a week. (Patient not taking: Reported on 06/05/2023) 2 mL 0   No current facility-administered medications for this visit.    Allergies as of 06/05/2023 - Review Complete 04/27/2023  Allergen Reaction Noted   Iodinated contrast media Nausea And Vomiting 01/25/2023   Morphine and codeine  Swelling 02/02/2017   Other Other (See Comments) 03/09/2020    Family History  Problem Relation Age of Onset   Cirrhosis Mother    Diabetes Mother    Arthritis Mother    Gout Mother    Depression Mother    Bowel Disease Mother    Heart disease Father    Diabetes Father    Heart attack Father 2       during knee surgery   Valvular heart disease Sister        had valve surgery   Depression Sister    Diabetes Brother    Factor V Leiden deficiency Brother        with blood clots   Hyperlipidemia Son    Cancer Maternal Grandmother 69       colon cancer   Colon cancer Maternal Grandmother    Atrial fibrillation Brother    Valvular heart disease Brother        valve surgery   Hypertension Brother    Esophageal cancer Neg Hx    Rectal cancer Neg Hx    Stomach cancer Neg Hx     Review of Systems:    Constitutional: No weight loss, fever, chills, weakness or fatigue HEENT: Eyes: No change in vision               Ears, Nose, Throat:  No change in hearing or congestion Skin: No rash or itching Cardiovascular: No chest pain, chest pressure or palpitations   Respiratory: No  SOB or cough Gastrointestinal: See HPI and otherwise negative Genitourinary: No dysuria or change in urinary frequency Neurological: No headache, dizziness or syncope Musculoskeletal: No new muscle or joint pain Hematologic: No bleeding or bruising Psychiatric: No history of depression or anxiety    Physical Exam:  Vital signs: BP 128/88 (BP Location: Left Arm, Patient Position: Sitting, Cuff Size: Large)   Pulse 80   Ht 5\' 6"  (1.676 m) Comment: height measured without shoes  Wt 177 lb 8 oz (80.5 kg)   BMI 28.65 kg/m   Constitutional:   Pleasant Caucasian female appears to be in NAD, Well developed, Well nourished, alert and cooperative Throat: Oral cavity and pharynx without inflammation, swelling or lesion.  Respiratory: Respirations even and unlabored. Lungs clear to auscultation bilaterally.   No wheezes, crackles, or rhonchi.  Cardiovascular: Normal S1, S2. Regular rate and rhythm. No peripheral edema, cyanosis or pallor.  Gastrointestinal:  Soft, nondistended, nontender. Obese No rebound or guarding. Normal bowel sounds. No appreciable masses or hepatomegaly. Rectal:  Not performed.  Msk:  Symmetrical without gross deformities. Without edema, no deformity or joint abnormality.  Neurologic:  Alert and  oriented x4;  grossly normal neurologically.  Skin:   Dry and intact without significant lesions or rashes. Psychiatric: Oriented to person, place and time. Demonstrates good judgement and reason without abnormal affect or behaviors.  RELEVANT LABS AND IMAGING: CBC    Latest Ref Rng & Units 01/24/2023    9:09 AM 05/24/2022    2:32 PM 05/13/2021    7:31 AM  CBC  WBC 4.0 - 10.5 K/uL 4.6  5.6  4.6   Hemoglobin 12.0 - 15.0 g/dL 16.1  09.6  04.5   Hematocrit 36.0 - 46.0 % 42.0  39.6  39.7   Platelets 150.0 - 400.0 K/uL 237.0  249.0  239.0      CMP     Latest Ref Rng & Units 01/24/2023    9:09 AM 03/18/2022    4:08 PM 05/13/2021  7:31 AM  CMP  Glucose 70 - 99 mg/dL 865  89   98   BUN 6 - 23 mg/dL 17  18  22    Creatinine 0.40 - 1.20 mg/dL 7.84  6.96  2.95   Sodium 135 - 145 mEq/L 143  142  142   Potassium 3.5 - 5.1 mEq/L 4.0  3.7  4.7   Chloride 96 - 112 mEq/L 106  106  106   CO2 19 - 32 mEq/L 27  25  30    Calcium 8.4 - 10.5 mg/dL 9.5  9.3  9.4   Total Protein 6.0 - 8.3 g/dL 6.3  6.7  6.4   Total Bilirubin 0.2 - 1.2 mg/dL 0.9  0.4  0.7   Alkaline Phos 39 - 117 U/L 56   50   AST 0 - 37 U/L 18  22  20    ALT 0 - 35 U/L 22  27  21       Lab Results  Component Value Date   TSH 1.64 01/24/2023    Assessment: Encounter Diagnoses  Name Primary?   Chronic idiopathic constipation Yes   Bloating    Gastroesophageal reflux disease without esophagitis    Liver hemangioma    Diverticulosis of colon without hemorrhage     68 year old female patient with history of fatty liver disease. I reviewed her records and she hasn't had fatty liver mentioned on exam since 12/2022. At that time she tells me she lost a lot of weight and her numbers improved. Her last set of LFT's were normal. She has not had any elevations in her LFT's on the last several exams.  I offered a liver elastography , however she declined. The benign lesions on most recent CT scan have not changed since 2023. We have multiple imaging on file that have examined them. I did recommend monitoring her labs with new herbal supplement she is taking with tumeric that has been effective for her joint pain.   For the CIC we discussed trying MOM and stopping the Miralax, if this is ineffective she can trial a prosecretory agent like Linzess. Will provide samples of the po daily. She has failed OTC fiber supplements and laxatives such as Miralax.     Her GERD is managed with above regimen, thus no changes needed.   Plan: -Ok to use Smooth move tea prn -Recommend she stop Miralax  -Recommend she try OTC Milk of Magnesium po daily initially, if does not work can try samples of  Linzess 72 mcg po daily -Recall  colon 06/2028 -Patient will call office if she needs follow-up  Thank you for the courtesy of this consult. Please call me with any questions or concerns.   Hena Ewalt, FNP-C [facility] 06/05/2023, 10:10 AM  Cc: Charlton Amor, DO

## 2023-06-05 ENCOUNTER — Encounter: Payer: Self-pay | Admitting: Gastroenterology

## 2023-06-05 ENCOUNTER — Ambulatory Visit: Payer: PPO | Admitting: Gastroenterology

## 2023-06-05 VITALS — BP 128/88 | HR 80 | Ht 66.0 in | Wt 177.5 lb

## 2023-06-05 DIAGNOSIS — D1803 Hemangioma of intra-abdominal structures: Secondary | ICD-10-CM

## 2023-06-05 DIAGNOSIS — K5904 Chronic idiopathic constipation: Secondary | ICD-10-CM

## 2023-06-05 DIAGNOSIS — K219 Gastro-esophageal reflux disease without esophagitis: Secondary | ICD-10-CM

## 2023-06-05 DIAGNOSIS — K5730 Diverticulosis of large intestine without perforation or abscess without bleeding: Secondary | ICD-10-CM | POA: Diagnosis not present

## 2023-06-05 DIAGNOSIS — R140 Abdominal distension (gaseous): Secondary | ICD-10-CM

## 2023-06-05 MED ORDER — LINACLOTIDE 72 MCG PO CAPS: 72.0000 ug | ORAL_CAPSULE | Freq: Every day | ORAL | 5 refills | Status: DC

## 2023-06-05 NOTE — Progress Notes (Signed)
 Agree with assessment and plan as outlined.

## 2023-06-05 NOTE — Patient Instructions (Addendum)
We have sent the following medications to your pharmacy for you to pick up at your convenience: Linzess 72 mcg daily. Samples have given in the office.   Please purchase the following medications over the counter and take as directed: milk of magnesia if Linzess does not help.   _______________________________________________________  If your blood pressure at your visit was 140/90 or greater, please contact your primary care physician to follow up on this.  _______________________________________________________  If you are age 66 or older, your body mass index should be between 23-30. Your Body mass index is 28.65 kg/m. If this is out of the aforementioned range listed, please consider follow up with your Primary Care Provider.  If you are age 5 or younger, your body mass index should be between 19-25. Your Body mass index is 28.65 kg/m. If this is out of the aformentioned range listed, please consider follow up with your Primary Care Provider.   ________________________________________________________  The  GI providers would like to encourage you to use [facility] to communicate with providers for non-urgent requests or questions.  Due to long hold times on the telephone, sending your provider a message by [facility] [employer] may be a faster and more efficient way to get a response.  Please allow 48 business hours for a response.  Please remember that this is for non-urgent requests.  _______________________________________________________

## 2023-06-12 ENCOUNTER — Ambulatory Visit: Payer: PPO

## 2023-06-24 ENCOUNTER — Other Ambulatory Visit: Payer: Self-pay | Admitting: Cardiology

## 2023-06-26 MED ORDER — CARVEDILOL 3.125 MG PO TABS: 3.1250 mg | ORAL_TABLET | Freq: Two times a day (BID) | ORAL | 2 refills | Status: DC

## 2023-08-04 ENCOUNTER — Ambulatory Visit: Payer: Self-pay | Admitting: Family Medicine

## 2023-08-04 NOTE — Telephone Encounter (Signed)
 Chief Complaint: Edema in fingers, ankles, and legs x1 week  Symptoms: Mild swelling in ankles- wears compression stockings, fingers and legs (below knees); denies any other symptoms Pertinent Negatives: Patient denies pain, difficulty breathing, chest pain, calf pain, redness  Disposition: [x] Appointment(In office)  Additional Notes: Pt states this happened a long time ago. Pt states when she does the Whole 30 diet this happens where she gains weight and retrains fluid. Pt states she is urinating normally. Pt scheduled for an appointment on Monday, 4/21, with Dr. Velma at PCP office. Pt PCP does not have any availability until June for an office visit. This RN educated pt on home care, new-worsening symptoms, when to call back/seek emergent care. Pt verbalized understanding and agrees to plan.    Copied from CRM [id number]. Topic: Clinical - Red Word Triage >> Aug 04, 2023 12:49 PM Adrianna P wrote: Red Word that prompted transfer to Nurse Triage: retaining fluid, hands and legs Reason for Disposition  Ankle swelling is a chronic symptom (recurrent or ongoing AND present > 4 weeks)  Answer Assessment - Initial Assessment Questions Chief Complaint: Edema in fingers, ankles, and legs x1 week  Symptoms: Mild swelling in ankles- wears compression stockings, fingers and legs (below knees); denies any other symptoms  Pertinent Negatives: Patient denies pain, difficulty breathing, chest pain, calf pain, redness  Protocols used: Ankle Swelling-A-AH

## 2023-08-07 ENCOUNTER — Ambulatory Visit (INDEPENDENT_AMBULATORY_CARE_PROVIDER_SITE_OTHER): Admitting: Family Medicine

## 2023-08-07 ENCOUNTER — Encounter: Payer: Self-pay | Admitting: Family Medicine

## 2023-08-07 VITALS — BP 110/70 | HR 71 | Ht 66.0 in | Wt 177.0 lb

## 2023-08-07 DIAGNOSIS — M21611 Bunion of right foot: Secondary | ICD-10-CM | POA: Insufficient documentation

## 2023-08-07 DIAGNOSIS — M79641 Pain in right hand: Secondary | ICD-10-CM

## 2023-08-07 DIAGNOSIS — M65339 Trigger finger, unspecified middle finger: Secondary | ICD-10-CM | POA: Diagnosis not present

## 2023-08-07 DIAGNOSIS — M79642 Pain in left hand: Secondary | ICD-10-CM | POA: Diagnosis not present

## 2023-08-07 DIAGNOSIS — R609 Edema, unspecified: Secondary | ICD-10-CM | POA: Insufficient documentation

## 2023-08-07 MED ORDER — FUROSEMIDE 20 MG PO TABS: 20.0000 mg | ORAL_TABLET | Freq: Every day | ORAL | 0 refills | Status: DC

## 2023-08-07 NOTE — Assessment & Plan Note (Signed)
 Referral placed to podiatry.

## 2023-08-07 NOTE — Progress Notes (Signed)
 Samantha Mckinney - 68 y.o. female MRN [id number]  Date of birth: 1955/12/12  Subjective Chief Complaint  Patient presents with   Weight Gain    HPI Samantha Mckinney is a  68 y.o. female here today with complaint of fluid retention.  She reports that she started a new diet recently however has noted her weight going up as well as some puffiness in her hands and ankles.  She has a history of tachycardia but denies palpitations, dyspnea or changes in urine output.  She denies any changes to medications.  She has had some issues with fluid retention in the past and used fluid pill short-term.  She would also like referral to podiatry for bunion on her right foot that is becoming more painful.  She would also like referral to Occupational Therapy at [facility] rehab in [place] due to trigger finger and hand stiffness.  ROS:  A comprehensive ROS was completed and negative except as noted per HPI  Allergies  Allergen Reactions   Iodinated Contrast Media Nausea And Vomiting   Morphine And Codeine Swelling   Other Other (See Comments)    Contrast dye allergy - N/V/D, swelling    Past Medical History:  Diagnosis Date   Allergy    Arthritis    lower back   Cataract    Colon polyps    Diverticulitis    Elevated cholesterol    Endometriosis 02/02/2017   Factor V Leiden (HCC) 02/02/2017   GERD (gastroesophageal reflux disease)    IBS (irritable bowel syndrome)    Liver hemangioma 02/02/2017   Low back pain    Lumbar radiculopathy    NASH (nonalcoholic steatohepatitis) 02/02/2017   Scoliosis    Spinal stenosis of lumbar region     Past Surgical History:  Procedure Laterality Date   ABDOMINAL HYSTERECTOMY  1998   TOTAL, on estrogen  until 2012   BACK SURGERY  2000   disc removed L4L5   CHOLECYSTECTOMY     COLONOSCOPY     ESOPHAGOGASTRODUODENOSCOPY  2014   In [place]   EYE SURGERY     Cataracts   HEMORRHOID SURGERY  1996   LAPAROSCOPIC ENDOMETRIOSIS FULGURATION     x 6, 1980-1991    UPPER GASTROINTESTINAL ENDOSCOPY      Social History   Socioeconomic History   Marital status: Married    Spouse name: Tom   Number of children: 1   Years of education: Not on file   Highest education level: Associate degree: occupational, Scientist, product/process development, or vocational program  Occupational History    Employer: [employer]    Comment: Loss adjuster, chartered  Tobacco Use   Smoking status: Never   Smokeless tobacco: Never  Vaping Use   Vaping status: Never Used  Substance and Sexual Activity   Alcohol use: Not Currently    Comment: OCC    Drug use: Never   Sexual activity: Not Currently    Partners: Male    Comment: 1st intercourse- 20, partners- 3, married- 24 yrs   Other Topics Concern   Not on file  Social History Narrative   Lives with husband   Caffeine- coffee 1-2 a day   Not able to exercise 2/2 back pain   Social Drivers of Health   Financial Resource Strain: Low Risk  (04/27/2023)   Overall Financial Resource Strain (CARDIA)    Difficulty of Paying Living Expenses: Not hard at all  Food Insecurity: No Food Insecurity (04/27/2023)   Hunger Vital Sign  Worried About Programme researcher, broadcasting/film/video in the Last Year: Never true    Ran Out of Food in the Last Year: Never true  Transportation Needs: No Transportation Needs (04/27/2023)   PRAPARE - Administrator, Civil Service (Medical): No    Lack of Transportation (Non-Medical): No  Physical Activity: Insufficiently Active (04/27/2023)   Exercise Vital Sign    Days of Exercise per Week: 3 days    Minutes of Exercise per Session: 30 min  Stress: No Stress Concern Present (04/27/2023)   [employer] of Occupational Health - Occupational Stress Questionnaire    Feeling of Stress : Not at all  Social Connections: Socially Integrated (04/27/2023)   Social Connection and Isolation Panel [NHANES]    Frequency of Communication with Friends and Family: Three times a week    Frequency of Social Gatherings with Friends and  Family: Once a week    Attends Religious Services: 1 to 4 times per year    Active Member of [employer] or Organizations: Yes    Attends Engineer, structural: 1 to 4 times per year    Marital Status: Married    Family History  Problem Relation Age of Onset   Cirrhosis Mother    Diabetes Mother    Arthritis Mother    Gout Mother    Depression Mother    Bowel Disease Mother    Heart disease Father    Diabetes Father    Heart attack Father 37       during knee surgery   Valvular heart disease Sister        had valve surgery   Depression Sister    Diabetes Brother    Factor V Leiden deficiency Brother        with blood clots   Hyperlipidemia Son    Cancer Maternal Grandmother 38       colon cancer   Colon cancer Maternal Grandmother    Atrial fibrillation Brother    Valvular heart disease Brother        valve surgery   Hypertension Brother    Esophageal cancer Neg Hx    Rectal cancer Neg Hx    Stomach cancer Neg Hx     Health Maintenance  Topic Date Due   Medicare Annual Wellness (AWV)  Never done   COVID-19 Vaccine (1 - 2024-25 season) Never done   INFLUENZA VACCINE  11/17/2023   MAMMOGRAM  06/08/2024   DTaP/Tdap/Td (2 - Td or Tdap) 02/22/2028   Colonoscopy  07/13/2028   DEXA SCAN  Completed   Hepatitis C Screening  Completed   HPV VACCINES  Aged Out   Meningococcal B Vaccine  Aged Out   Pneumonia Vaccine 61+ Years old  Discontinued   Zoster Vaccines- Shingrix  Discontinued     ----------------------------------------------------------------------------------------------------------------------------------------------------------------------------------------------------------------- Physical Exam BP 110/70 (BP Location: Left Arm, Patient Position: Sitting, Cuff Size: Normal)   Pulse 71   Ht 5\' 6"  (1.676 m)   Wt 177 lb (80.3 kg)   SpO2 99%   BMI 28.57 kg/m   Physical Exam Constitutional:      Appearance: Normal appearance.  HENT:     Head:  Normocephalic and atraumatic.  Eyes:     General: No scleral icterus. Cardiovascular:     Rate and Rhythm: Normal rate and regular rhythm.  Pulmonary:     Effort: Pulmonary effort is normal.     Breath sounds: Normal breath sounds.  Musculoskeletal:     Comments: Mild edema  and lower extremities without significant pitting.  Mild puffiness around bilateral hands.  Neurological:     Mental Status: She is alert.  Psychiatric:        Mood and Affect: Mood normal.        Behavior: Behavior normal.     ------------------------------------------------------------------------------------------------------------------------------------------------------------------------------------------------------------------- Assessment and Plan  Edema She does have history of chronic kidney disease as well as history of tachycardia.  Regular rate and rhythm on exam today.  Checking renal function, TSH and BNP.  Will add short course of furosemide .  Discussed low-sodium diet.  Bunion of great toe of right foot Referral placed to podiatry.  Trigger middle finger Referral to Occupational Therapy.   Meds ordered this encounter  Medications   furosemide  (LASIX ) 20 MG tablet    Sig: Take 1 tablet (20 mg total) by mouth daily.    Dispense:  15 tablet    Refill:  0    No follow-ups on file.

## 2023-08-07 NOTE — Assessment & Plan Note (Signed)
 She does have history of chronic kidney disease as well as history of tachycardia.  Regular rate and rhythm on exam today.  Checking renal function, TSH and BNP.  Will add short course of furosemide .  Discussed low-sodium diet.

## 2023-08-07 NOTE — Assessment & Plan Note (Signed)
Referral to Occupational Therapy

## 2023-08-07 NOTE — Patient Instructions (Addendum)
 Take furosemide  20mg  daily for the next 7 days. Be sure to eat a high potassium diet with this.

## 2023-08-07 NOTE — Telephone Encounter (Signed)
 Patient scheduled 08/07/23 with Dr. Augustus Ledger.

## 2023-08-08 LAB — CMP14+EGFR
ALT: 20 IU/L (ref 0–32)
AST: 21 IU/L (ref 0–40)
Albumin: 4.4 g/dL (ref 3.9–4.9)
Alkaline Phosphatase: 63 IU/L (ref 44–121)
BUN/Creatinine Ratio: 20 (ref 12–28)
BUN: 16 mg/dL (ref 8–27)
Bilirubin Total: 0.6 mg/dL (ref 0.0–1.2)
CO2: 22 mmol/L (ref 20–29)
Calcium: 9.3 mg/dL (ref 8.7–10.3)
Chloride: 106 mmol/L (ref 96–106)
Creatinine, Ser: 0.82 mg/dL (ref 0.57–1.00)
Globulin, Total: 1.9 g/dL (ref 1.5–4.5)
Glucose: 94 mg/dL (ref 70–99)
Potassium: 4.3 mmol/L (ref 3.5–5.2)
Sodium: 141 mmol/L (ref 134–144)
Total Protein: 6.3 g/dL (ref 6.0–8.5)
eGFR: 79 mL/min/{1.73_m2} (ref >59)

## 2023-08-08 LAB — CBC WITH DIFFERENTIAL/PLATELET
Basophils Absolute: 0.1 10*3/uL (ref 0.0–0.2)
Basos: 2 %
EOS (ABSOLUTE): 0.2 10*3/uL (ref 0.0–0.4)
Eos: 5 %
Hematocrit: 42.9 % (ref 34.0–46.6)
Hemoglobin: 14 g/dL (ref 11.1–15.9)
Immature Grans (Abs): 0 10*3/uL (ref 0.0–0.1)
Immature Granulocytes: 0 %
Lymphocytes Absolute: 1.9 10*3/uL (ref 0.7–3.1)
Lymphs: 38 %
MCH: 30.1 pg (ref 26.6–33.0)
MCHC: 32.6 g/dL (ref 31.5–35.7)
MCV: 92 fL (ref 79–97)
Monocytes Absolute: 0.4 10*3/uL (ref 0.1–0.9)
Monocytes: 8 %
Neutrophils Absolute: 2.4 10*3/uL (ref 1.4–7.0)
Neutrophils: 47 %
Platelets: 242 10*3/uL (ref 150–450)
RBC: 4.65 x10E6/uL (ref 3.77–5.28)
RDW: 11.6 % — ABNORMAL LOW (ref 11.7–15.4)
WBC: 5 10*3/uL (ref 3.4–10.8)

## 2023-08-08 LAB — TSH: TSH: 1.54 u[IU]/mL (ref 0.450–4.500)

## 2023-08-08 LAB — BRAIN NATRIURETIC PEPTIDE: BNP: 17.1 pg/mL (ref 0.0–100.0)

## 2023-08-09 ENCOUNTER — Encounter: Payer: Self-pay | Admitting: Family Medicine

## 2023-08-10 ENCOUNTER — Encounter: Payer: Self-pay | Admitting: Family Medicine

## 2023-08-11 NOTE — Telephone Encounter (Signed)
 Patient informed.

## 2023-08-17 ENCOUNTER — Ambulatory Visit

## 2023-08-17 ENCOUNTER — Ambulatory Visit: Admitting: Podiatry

## 2023-08-17 ENCOUNTER — Encounter: Payer: Self-pay | Admitting: Podiatry

## 2023-08-17 ENCOUNTER — Other Ambulatory Visit: Payer: Self-pay

## 2023-08-17 DIAGNOSIS — D2371 Other benign neoplasm of skin of right lower limb, including hip: Secondary | ICD-10-CM

## 2023-08-17 DIAGNOSIS — M21619 Bunion of unspecified foot: Secondary | ICD-10-CM

## 2023-08-17 DIAGNOSIS — M19071 Primary osteoarthritis, right ankle and foot: Secondary | ICD-10-CM | POA: Diagnosis not present

## 2023-08-17 DIAGNOSIS — M2011 Hallux valgus (acquired), right foot: Secondary | ICD-10-CM | POA: Diagnosis not present

## 2023-08-17 DIAGNOSIS — Q6671 Congenital pes cavus, right foot: Secondary | ICD-10-CM | POA: Diagnosis not present

## 2023-08-17 DIAGNOSIS — M21611 Bunion of right foot: Secondary | ICD-10-CM

## 2023-08-17 NOTE — Progress Notes (Signed)
  Subjective:  Patient ID: Samantha Mckinney, female    DOB: 17-Feb-1956,   MRN: [id number]  Chief Complaint  Patient presents with   Bunions    Pt presents for painful bunion of right foot, states she had the bunions on both feet for a while now but the one on the right is starting to bother her     68 y.o. female presents for concern of right foot bunion that has been present for years but recenlty started to hurt more especially after the winter and wearing more enclosed shoes. Relates she has tried different shoes and does better in sandals and her new balances. Has tried some stretching and splinting and toe spacers.   . Denies any other pedal complaints. Denies n/v/f/c.   Past Medical History:  Diagnosis Date   Allergy    Arthritis    lower back   Cataract    Colon polyps    Diverticulitis    Elevated cholesterol    Endometriosis 02/02/2017   Factor V Leiden (HCC) 02/02/2017   GERD (gastroesophageal reflux disease)    IBS (irritable bowel syndrome)    Liver hemangioma 02/02/2017   Low back pain    Lumbar radiculopathy    NASH (nonalcoholic steatohepatitis) 02/02/2017   Scoliosis    Spinal stenosis of lumbar region     Objective:  Physical Exam: Vascular: DP/PT pulses 2/4 bilateral. CFT <3 seconds. Normal hair growth on digits. No edema.  Skin. No lacerations or abrasions bilateral feet. Hyperkeratotic cored lesion noted to medial eminence on right.  Musculoskeletal: MMT 5/5 bilateral lower extremities in DF, PF, Inversion and Eversion. Deceased ROM in DF of ankle joint. 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 Tenderness to medial eminence noted. First ray hypermobility noted. No pain with ROM of the joint. Able to reduce.  Neurological: Sensation intact to light touch.   Assessment:   1. Bunion of great toe of right foot   2. 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 -Xrays reviewed. 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 Minimal degenerative changes noted to first MPJ.  -Discussed HAV and treatment options;conservative and surgical management; risks, benefits, alternatives discussed. All patient's questions answered. -Discussed padding and wide shoe gear.   -Recommend continue with good supportive shoes and inserts.  -Hyperkeratotic tissue debrided as courtesy. Do feel this could be cause of pain.  -Discussed surgical options.  -Patient to return to office as needed or sooner if condition worsens.   Jennefer Moats, DPM

## 2023-08-18 ENCOUNTER — Encounter: Payer: Self-pay | Admitting: Family Medicine

## 2023-08-24 ENCOUNTER — Encounter: Payer: Self-pay | Admitting: Family Medicine

## 2023-08-28 DIAGNOSIS — M79643 Pain in unspecified hand: Secondary | ICD-10-CM | POA: Diagnosis not present

## 2023-08-28 DIAGNOSIS — M79641 Pain in right hand: Secondary | ICD-10-CM | POA: Diagnosis not present

## 2023-08-28 DIAGNOSIS — M79642 Pain in left hand: Secondary | ICD-10-CM | POA: Diagnosis not present

## 2023-08-31 ENCOUNTER — Ambulatory Visit (INDEPENDENT_AMBULATORY_CARE_PROVIDER_SITE_OTHER): Admitting: Family Medicine

## 2023-08-31 ENCOUNTER — Encounter: Payer: Self-pay | Admitting: Family Medicine

## 2023-08-31 VITALS — BP 128/84 | HR 68 | Temp 98.7°F | Resp 18 | Ht 66.0 in | Wt 177.2 lb

## 2023-08-31 DIAGNOSIS — Z8719 Personal history of other diseases of the digestive system: Secondary | ICD-10-CM

## 2023-08-31 DIAGNOSIS — R7989 Other specified abnormal findings of blood chemistry: Secondary | ICD-10-CM

## 2023-08-31 DIAGNOSIS — Z7689 Persons encountering health services in other specified circumstances: Secondary | ICD-10-CM | POA: Diagnosis not present

## 2023-08-31 DIAGNOSIS — R350 Frequency of micturition: Secondary | ICD-10-CM

## 2023-08-31 DIAGNOSIS — R1032 Left lower quadrant pain: Secondary | ICD-10-CM | POA: Insufficient documentation

## 2023-08-31 HISTORY — DX: Persons encountering health services in other specified circumstances: Z76.89

## 2023-08-31 MED ORDER — AMOXICILLIN-POT CLAVULANATE 875-125 MG PO TABS: 1.0000 | ORAL_TABLET | Freq: Two times a day (BID) | ORAL | 0 refills | Status: DC

## 2023-08-31 NOTE — Assessment & Plan Note (Signed)
 No dysuria. Feels like she does not empty bladder completely. History of total hysterectomy.  U/A today.

## 2023-08-31 NOTE — Progress Notes (Signed)
 New Patient Office Visit  Subjective    Patient ID: Muneerah Imlay, female    DOB: [birth year]  Age: 68 y.o. MRN: [id number]  CC:  Chief Complaint  Patient presents with   Transitions Of Care    From Dr. March Service pt has had stomach pain and tenderness since mon. Nauseous pt has and gastrologist didn't make an appt wanted to see what you thought about it first. She mentioned she does have diverticulitis but this is a different feeling    HPI Karil Araque presents to establish care with  this practice. She is new to me.  Recent beach trip and got constipated. Took ducolax on Tuesday and starting having BMs at noon.  Having stools now, not diarrhea. Nausea is severe. History of divert. Has pain in LLQ.  This feels different.  Feels "weird" inside. Has scar tissue from hysterectomy, has itching in that area. No fever or chills.  Increased in urinary frequency.   Chart review: 08/28/23: trigger finger OT needed.  Sees cards: on carvedilol  3.125 BID Referral to GI for fatty liver in January Labs 08/07/23: TSH: normal, BNP: normal, CMp: normal, CBC: normal.    Outpatient Encounter Medications as of 08/31/2023  Medication Sig   amoxicillin -clavulanate (AUGMENTIN ) 875-125 MG tablet Take 1 tablet by mouth 2 (two) times daily.   aspirin EC 81 MG tablet Take 81 mg by mouth as needed.   B Complex Vitamins (VITAMIN B COMPLEX PO) vitamin B complex   carvedilol  (COREG ) 3.125 MG tablet Take 1 tablet (3.125 mg total) by mouth 2 (two) times daily.   Cholecalciferol (D3 PO) Take 4,000 mg by mouth daily.   clotrimazole -betamethasone  (LOTRISONE ) cream Apply 1 application. topically 2 (two) times daily.   furosemide  (LASIX ) 20 MG tablet Take 1 tablet (20 mg total) by mouth daily.   magnesium oxide (MAG-OX) 400 (240 Mg) MG tablet Take 400 mg by mouth daily.   metroNIDAZOLE  (METROCREAM ) 0.75 % cream Apply 1 Application topically as needed.   NON FORMULARY Smooth Move  1 cup of tea by mouth as needed    OMEGA-3 FATTY ACIDS PO Take 1 capsule by mouth daily.   pantoprazole  (PROTONIX ) 40 MG tablet Take 1 tablet (40 mg total) by mouth daily. (Patient taking differently: Take 40 mg by mouth every other day.)   polyethylene glycol (MIRALAX / GLYCOLAX) 17 g packet Take 17 g by mouth as needed.   No facility-administered encounter medications on file as of 08/31/2023.    Past Medical History:  Diagnosis Date   Allergy    Arthritis    lower back   Cataract    Colon polyps    Diverticulitis    Elevated cholesterol    Endometriosis 02/02/2017   Establishing care with new doctor, encounter for 08/31/2023   Factor V Leiden (HCC) 02/02/2017   GERD (gastroesophageal reflux disease)    IBS (irritable bowel syndrome)    Liver hemangioma 02/02/2017   Low back pain    Lumbar radiculopathy    NASH (nonalcoholic steatohepatitis) 02/02/2017   Scoliosis    Spinal stenosis of lumbar region     Past Surgical History:  Procedure Laterality Date   ABDOMINAL HYSTERECTOMY  1998   TOTAL, on estrogen  until 2012   BACK SURGERY  2000   disc removed L4L5   CHOLECYSTECTOMY     COLONOSCOPY     ESOPHAGOGASTRODUODENOSCOPY  2014   In [place]   EYE SURGERY     Cataracts   HEMORRHOID SURGERY  1996  LAPAROSCOPIC ENDOMETRIOSIS FULGURATION     x 6, 1980-1991   UPPER GASTROINTESTINAL ENDOSCOPY      Family History  Problem Relation Age of Onset   Cirrhosis Mother    Diabetes Mother    Arthritis Mother    Gout Mother    Depression Mother    Bowel Disease Mother    Heart disease Father    Diabetes Father    Heart attack Father 56       during knee surgery   Valvular heart disease Sister        had valve surgery   Depression Sister    Diabetes Brother    Factor V Leiden deficiency Brother        with blood clots   Hyperlipidemia Son    Cancer Maternal Grandmother 32       colon cancer   Colon cancer Maternal Grandmother    Atrial fibrillation Brother    Valvular heart disease Brother        valve  surgery   Hypertension Brother    Esophageal cancer Neg Hx    Rectal cancer Neg Hx    Stomach cancer Neg Hx     Social History   Socioeconomic History   Marital status: Married    Spouse name: Tom   Number of children: 1   Years of education: Not on file   Highest education level: Associate degree: occupational, Scientist, product/process development, or vocational program  Occupational History    Employer: [employer]    Comment: Loss adjuster, chartered  Tobacco Use   Smoking status: Never   Smokeless tobacco: Never  Vaping Use   Vaping status: Never Used  Substance and Sexual Activity   Alcohol use: Not Currently    Comment: OCC    Drug use: Never   Sexual activity: Not Currently    Partners: Male    Comment: 1st intercourse- 20, partners- 3, married- 24 yrs   Other Topics Concern   Not on file  Social History Narrative   Lives with husband   Caffeine- coffee 1-2 a day   Not able to exercise 2/2 back pain   Social Drivers of Health   Financial Resource Strain: Low Risk  (04/27/2023)   Overall Financial Resource Strain (CARDIA)    Difficulty of Paying Living Expenses: Not hard at all  Food Insecurity: No Food Insecurity (04/27/2023)   Hunger Vital Sign    Worried About Running Out of Food in the Last Year: Never true    Ran Out of Food in the Last Year: Never true  Transportation Needs: No Transportation Needs (04/27/2023)   PRAPARE - Administrator, Civil Service (Medical): No    Lack of Transportation (Non-Medical): No  Physical Activity: Insufficiently Active (04/27/2023)   Exercise Vital Sign    Days of Exercise per Week: 3 days    Minutes of Exercise per Session: 30 min  Stress: No Stress Concern Present (04/27/2023)   [employer] of Occupational Health - Occupational Stress Questionnaire    Feeling of Stress : Not at all  Social Connections: Socially Integrated (04/27/2023)   Social Connection and Isolation Panel [NHANES]    Frequency of Communication with Friends and  Family: Three times a week    Frequency of Social Gatherings with Friends and Family: Once a week    Attends Religious Services: 1 to 4 times per year    Active Member of [employer] or Organizations: Yes    Attends Banker Meetings:  1 to 4 times per year    Marital Status: Married  Intimate Partner Violence: Unknown (09/13/2022)   Received from [facility]   HITS    Physically Hurt: Not on file    Insult or Talk Down To: Not on file    Threaten Physical Harm: Not on file    Scream or Curse: Not on file    Review of Systems  Gastrointestinal:  Positive for abdominal pain, constipation and nausea. Negative for vomiting.  Psychiatric/Behavioral:  Negative for depression.         Objective    BP 128/84 (BP Location: Left Arm, Patient Position: Sitting, Cuff Size: Normal)   Pulse 68   Temp 98.7 F (37.1 C) (Oral)   Resp 18   Ht 5\' 6"  (1.676 m)   Wt 177 lb 4 oz (80.4 kg)   SpO2 97%   BMI 28.61 kg/m   Physical Exam Vitals and nursing note reviewed.  Constitutional:      General: She is not in acute distress.    Appearance: Normal appearance.  Cardiovascular:     Rate and Rhythm: Regular rhythm.     Heart sounds: Normal heart sounds.  Pulmonary:     Effort: Pulmonary effort is normal.     Breath sounds: Normal breath sounds.  Abdominal:     Palpations: Abdomen is soft.     Tenderness: There is abdominal tenderness in the left lower quadrant. There is no right CVA tenderness or left CVA tenderness.  Skin:    General: Skin is warm and dry.  Neurological:     General: No focal deficit present.     Mental Status: She is alert. Mental status is at baseline.  Psychiatric:        Mood and Affect: Mood normal.        Behavior: Behavior normal.        Thought Content: Thought content normal.        Judgment: Judgment normal.        Assessment & Plan:   Problem List Items Addressed This Visit     Establishing care with new doctor, encounter  for - Primary   Left lower quadrant abdominal pain   History of diverticulitis with LLQ pain. Nausea. No fever or chills.  Has been constipated as well. Treated constipation and does not feel this is  problem now.  Augmentin  875-125 mg BID for 10 days. If symptoms do not resolve, will order abdominal ultrasound. If symptoms worsen, go to ED for evaluation.        Relevant Medications   amoxicillin -clavulanate (AUGMENTIN ) 875-125 MG tablet   Increased urinary frequency   No dysuria. Feels like she does not empty bladder completely. History of total hysterectomy.  U/A today.       Relevant Orders   Urinalysis, Routine w reflex microscopic   Elevated ferritin   Recheck today.      Relevant Orders   Ferritin   History of diverticulitis of colon   Relevant Medications   amoxicillin -clavulanate (AUGMENTIN ) 875-125 MG tablet  Agrees with plan of care discussed.  Questions answered.   Return if symptoms worsen or fail to improve.   Mickiel Albany, FNP

## 2023-08-31 NOTE — Assessment & Plan Note (Addendum)
 History of diverticulitis with LLQ pain. Nausea. No fever or chills.  Has been constipated as well. Treated constipation and does not feel this is  problem now.  Augmentin  875-125 mg BID for 10 days. If symptoms do not resolve, will order abdominal ultrasound. If symptoms worsen, go to ED for evaluation.

## 2023-08-31 NOTE — Assessment & Plan Note (Signed)
 Recheck today.

## 2023-09-01 ENCOUNTER — Ambulatory Visit: Payer: Self-pay | Admitting: Family Medicine

## 2023-09-01 LAB — URINALYSIS, ROUTINE W REFLEX MICROSCOPIC
Bilirubin, UA: NEGATIVE
Glucose, UA: NEGATIVE
Ketones, UA: NEGATIVE
Leukocytes,UA: NEGATIVE
Nitrite, UA: NEGATIVE
Protein,UA: NEGATIVE
RBC, UA: NEGATIVE
Specific Gravity, UA: 1.016 (ref 1.005–1.030)
Urobilinogen, Ur: 0.2 mg/dL (ref 0.2–1.0)
pH, UA: 8 — ABNORMAL HIGH (ref 5.0–7.5)

## 2023-09-01 LAB — FERRITIN: Ferritin: 173 ng/mL — ABNORMAL HIGH (ref 15–150)

## 2023-09-04 DIAGNOSIS — M79642 Pain in left hand: Secondary | ICD-10-CM | POA: Diagnosis not present

## 2023-09-04 DIAGNOSIS — M79641 Pain in right hand: Secondary | ICD-10-CM | POA: Diagnosis not present

## 2023-09-04 DIAGNOSIS — M79643 Pain in unspecified hand: Secondary | ICD-10-CM | POA: Diagnosis not present

## 2023-09-14 ENCOUNTER — Ambulatory Visit (INDEPENDENT_AMBULATORY_CARE_PROVIDER_SITE_OTHER): Admitting: Family Medicine

## 2023-09-14 ENCOUNTER — Encounter: Payer: Self-pay | Admitting: Family Medicine

## 2023-09-14 VITALS — BP 120/75 | HR 66 | Temp 98.4°F | Ht 66.0 in | Wt 176.0 lb

## 2023-09-14 DIAGNOSIS — R109 Unspecified abdominal pain: Secondary | ICD-10-CM | POA: Diagnosis not present

## 2023-09-14 NOTE — Assessment & Plan Note (Signed)
 Symptoms have improved since taking antibiotic. Continues with nausea and lower abdominal tenderness. Plans to follow up with her current GI specialist for further evaluation.  No red flags today.

## 2023-09-14 NOTE — Patient Instructions (Signed)
  Recommendations for optimal liver health: Avoid alcohol and tobacco products. Avoid added sugars in beverages, foods and store bought juices. Exercise at least 20 minutes per day. If you are overweight, modest weight loss will help. Limit the consumption of red meat to once per week.  Avoid highly processed foods,focusing on natural whole foods. Black coffee can help the liver.

## 2023-09-14 NOTE — Progress Notes (Signed)
   Established Patient Office Visit  Subjective   Patient ID: Samantha Mckinney, female    DOB: Aug 22, 1955  Age: 68 y.o. MRN: [id number]  Chief Complaint  Patient presents with   Nausea    Still having nausea, belching and pain with eating since her last visit.    Follow-up from 08/31/23 visit. Treated for diverticulitis with Augmentin .  Today:  Continues to have nausea, and belching, not emptying bladder. Cranberry juice for urinary health.  Mostly liquids, plant based protein powder. Tolerating well. Competed Augmentin  due to history of diverticulitis. No gallbladder. Continues to have some abdominal pain.  Wonders if this is a hernia. Symptoms have improved some. Wants to follow-up with GI. Recheck urine today.         Review of Systems  Gastrointestinal:  Positive for abdominal pain and nausea.      Objective:     BP 120/75 (BP Location: Left Arm, Patient Position: Sitting, Cuff Size: Normal)   Pulse 66   Temp 98.4 F (36.9 C) (Oral)   Ht 5\' 6"  (1.676 m)   Wt 176 lb (79.8 kg)   SpO2 97%   BMI 28.41 kg/m    Physical Exam Vitals and nursing note reviewed.  Constitutional:      General: She is not in acute distress.    Appearance: Normal appearance.  Cardiovascular:     Heart sounds: Normal heart sounds.  Pulmonary:     Effort: Pulmonary effort is normal.     Breath sounds: Normal breath sounds.  Abdominal:     General: Bowel sounds are normal.     Palpations: Abdomen is soft.     Tenderness: There is abdominal tenderness (lower abdomen).  Skin:    General: Skin is warm and dry.  Neurological:     General: No focal deficit present.     Mental Status: She is alert. Mental status is at baseline.  Psychiatric:        Mood and Affect: Mood normal.        Behavior: Behavior normal.        Thought Content: Thought content normal.        Judgment: Judgment normal.      No results found for any visits on 09/14/23.    The 10-year ASCVD risk score  (Arnett DK, et al., 2019) is: 7.7%    Assessment & Plan:   Problem List Items Addressed This Visit     Abdominal pain - Primary   Symptoms have improved since taking antibiotic. Continues with nausea and lower abdominal tenderness. Plans to follow up with her current GI specialist for further evaluation.  No red flags today.       Relevant Orders   Urinalysis, Routine w reflex microscopic  Agrees with plan of care discussed.  Questions answered.   Return if symptoms worsen or fail to improve.    Mickiel Albany, FNP

## 2023-09-15 ENCOUNTER — Ambulatory Visit: Payer: Self-pay | Admitting: Family Medicine

## 2023-09-15 LAB — URINALYSIS, ROUTINE W REFLEX MICROSCOPIC
Bilirubin, UA: NEGATIVE
Glucose, UA: NEGATIVE
Ketones, UA: NEGATIVE
Leukocytes,UA: NEGATIVE
Nitrite, UA: NEGATIVE
Protein,UA: NEGATIVE
RBC, UA: NEGATIVE
Specific Gravity, UA: 1.008 (ref 1.005–1.030)
Urobilinogen, Ur: 0.2 mg/dL (ref 0.2–1.0)
pH, UA: 7.5 (ref 5.0–7.5)

## 2023-09-27 ENCOUNTER — Ambulatory Visit: Payer: PPO | Attending: Cardiology | Admitting: Cardiology

## 2023-09-27 ENCOUNTER — Encounter: Payer: Self-pay | Admitting: Cardiology

## 2023-09-27 VITALS — BP 118/78 | HR 63 | Ht 66.0 in | Wt 178.9 lb

## 2023-09-27 DIAGNOSIS — I10 Essential (primary) hypertension: Secondary | ICD-10-CM

## 2023-09-27 DIAGNOSIS — E782 Mixed hyperlipidemia: Secondary | ICD-10-CM

## 2023-09-27 DIAGNOSIS — K7581 Nonalcoholic steatohepatitis (NASH): Secondary | ICD-10-CM

## 2023-09-27 DIAGNOSIS — I479 Paroxysmal tachycardia, unspecified: Secondary | ICD-10-CM

## 2023-09-27 DIAGNOSIS — E785 Hyperlipidemia, unspecified: Secondary | ICD-10-CM | POA: Diagnosis not present

## 2023-09-27 DIAGNOSIS — R079 Chest pain, unspecified: Secondary | ICD-10-CM

## 2023-09-27 DIAGNOSIS — I872 Venous insufficiency (chronic) (peripheral): Secondary | ICD-10-CM

## 2023-09-27 NOTE — Patient Instructions (Signed)
 Medication Instructions:   No changes *If you need a refill on your cardiac medications before your next appointment, please call your pharmacy*   Lab Work: Lipid CMP If you have labs (blood work) drawn today and your tests are completely normal, you will receive your results only by: MyChart Message (if you have MyChart) OR A paper copy in the mail If you have any lab test that is abnormal or we need to change your treatment, we will call you to review the results.   Testing/Procedures: CT coronary calcium score.   Test locations:  [facility]  [facility] at [facility]  This is $99 out of pocket.   Coronary CalciumScan A coronary calcium scan is an imaging test used to look for deposits of calcium and other fatty materials (plaques) in the inner lining of the blood vessels of the heart (coronary arteries). These deposits of calcium and plaques can partly clog and narrow the coronary arteries without producing any symptoms or warning signs. This puts a person at risk for a heart attack. This test can detect these deposits before symptoms develop. Tell a health care provider about: Any allergies you have. All medicines you are taking, including vitamins, herbs, eye drops, creams, and over-the-counter medicines. Any problems you or family members have had with anesthetic medicines. Any blood disorders you have. Any surgeries you have had. Any medical conditions you have. Whether you are pregnant or may be pregnant. What are the risks? Generally, this is a safe procedure. However, problems may occur, including: Harm to a pregnant woman and her unborn baby. This test involves the use of radiation. Radiation exposure can be dangerous to a pregnant woman and her unborn baby. If you are pregnant, you generally should not have this procedure done. Slight increase in the risk of cancer. This is because of the  radiation involved in the test. What happens before the procedure? No preparation is needed for this procedure. What happens during the procedure? You will undress and remove any jewelry around your neck or chest. You will put on a hospital gown. Sticky electrodes will be placed on your chest. The electrodes will be connected to an electrocardiogram (ECG) machine to record a tracing of the electrical activity of your heart. A CT scanner will take pictures of your heart. During this time, you will be asked to lie still and hold your breath for 2-3 seconds while a picture of your heart is being taken. The procedure may vary among health care providers and hospitals. What happens after the procedure? You can get dressed. You can return to your normal activities. It is up to you to get the results of your test. Ask your health care provider, or the department that is doing the test, when your results will be ready. Summary A coronary calcium scan is an imaging test used to look for deposits of calcium and other fatty materials (plaques) in the inner lining of the blood vessels of the heart (coronary arteries). Generally, this is a safe procedure. Tell your health care provider if you are pregnant or may be pregnant. No preparation is needed for this procedure. A CT scanner will take pictures of your heart. You can return to your normal activities after the scan is done. This information is not intended to replace advice given to you by your health care provider. Make sure you discuss any questions you have with your health care provider. Document  Released: 10/01/2007 Document Revised: 02/22/2016 Document Reviewed: 02/22/2016 Elsevier Interactive Patient Education  2017 [employer].    Follow-Up: At [facility], you and your health needs are our priority.  As part of our continuing mission to provide you with exceptional heart care, we have created designated Provider Care Teams.  These Care  Teams include your primary Cardiologist (physician) and Advanced Practice Providers (APPs -  Physician Assistants and Nurse Practitioners) who all work together to provide you with the care you need, when you need it.     Your next appointment:   6 month(s)  The format for your next appointment:   In Person  Provider:   Randene Bustard, MD   Other Instructions

## 2023-09-27 NOTE — Progress Notes (Signed)
 Cardiology Office Note:  .   Date:  10/01/2023  ID:  Samantha Mckinney, DOB 1955/10/22, MRN [id number] PCP: Mickiel Albany, FNP  [facility] Providers Cardiologist:  Randene Bustard, MD     Chief Complaint  Patient presents with   Follow-up    6-month follow-up.  Doing well.    Patient Profile: .     Samantha Mckinney is a  68 y.o. female with a PMH notable for hypertension, hyperlipidemia, paroxysmal tachycardia, factor V Leiden deficiency and NASH who presents here for 28-month follow-up at the request of Wachs, Erika S, DO.    Samantha Mckinney was last seen on March 23, 2023 by Friddie Jetty, NP noted that she retired in May 2024.  Her carvedilol  dose has been decreased to 3.125 mg in the morning and 6.25 mg in the evening.  Palpitation much better control with less stress from working.  Palpitations are well-controlled.  Subjective  Discussed the use of AI scribe software for clinical note transcription with the patient, who gave verbal consent to proceed.  History of Present Illness  Samantha Mckinney is a 68 year old female with hyperlipidemia and tachycardia who presents with shortness of breath and chest discomfort.  She experiences a 'really weird cough' and a sensation of needing to breathe heavily when lying down at night. The cough is described as a 'deep chest cough', and she sometimes feels like she is having a heart attack, although she suspects it might be acid reflux. These symptoms do not occur during exercise on her treadmill or bike, but she occasionally feels short of breath when walking long distances, especially uphill or on stairs. No chest tightness or pressure during exercise. She denies waking up short of breath or needing to sit up at night.  She has a history of high cholesterol, with a total cholesterol previously recorded at 230 mg/dL, and 409 mg/dL in December 8119. Her LDL was 129 mg/dL at that time. She recalls having her cholesterol checked last fall or in  January, but the results are not available in the current system. She is currently taking carvedilol  at a dose of 3.125 mg for her tachycardia, which has improved. She also takes medication for acid reflux as needed.  In late April, she experienced significant fluid retention with swelling in her stomach and ankles, which was treated with a diuretic and antibiotics. Her stomach pain has improved over the past three weeks. She occasionally experiences swelling in her left ankle and wears support hose to manage this.  Her family history includes her father, who died of a heart attack, and her mother, who died of non-alcoholic cirrhosis of the liver. She has been actively managing her liver health, with recent tests showing no fat on the liver. She does not have a gallbladder.  She maintains an active lifestyle, engaging in treadmill or bike exercises three times a week and performing yoga stretches every morning. She has retired from a high-stress job, which she believes has contributed to her improved heart rate.    Objective    Studies Reviewed: Aaron Aas   EKG Interpretation Date/Time:  Wednesday September 27 2023 10:23:41 EDT Ventricular Rate:  63 PR Interval:  132 QRS Duration:  68 QT Interval:  432 QTC Calculation: 442 R Axis:   28  Text Interpretation: Normal sinus rhythm Nonspecific ST abnormality When compared with ECG of 23-Mar-2023 09:50, Nonspecific T wave abnormality no longer evident in Inferior leads Confirmed by Randene Bustard ([id number]) on 09/27/2023 11:10:53 AM  No new studies  Lab Results  Component Value Date   CHOL 217 (H) 09/27/2023   HDL 51 09/27/2023   LDLCALC 140 (H) 09/27/2023   TRIG 143 09/27/2023   CHOLHDL 4.3 09/27/2023   Lab Results  Component Value Date   NA 143 09/27/2023   K 4.9 09/27/2023   CREATININE 0.88 09/27/2023   EGFR 72 09/27/2023   GLUCOSE 91 09/27/2023    Risk Assessment/Calculations:         Physical Exam:   VS:  BP 118/78 (BP Location: Left  Arm, Patient Position: Sitting, Cuff Size: Normal)   Pulse 63   Ht 5' 6 (1.676 m)   Wt 178 lb 14.4 oz (81.1 kg)   SpO2 99%   BMI 28.88 kg/m    Wt Readings from Last 3 Encounters:  09/27/23 178 lb 14.4 oz (81.1 kg)  09/14/23 176 lb (79.8 kg)  08/31/23 177 lb 4 oz (80.4 kg)    GEN: Well nourished, well groomed in no acute distress; healthy-appearing NECK: No JVD; No carotid bruits CARDIAC: Normal S1, S2; RRR, no murmurs, rubs, gallops RESPIRATORY:  Clear to auscultation without rales, wheezing or rhonchi ; nonlabored, good air movement. ABDOMEN: Soft, non-tender, non-distended EXTREMITIES:  No edema; No deformity      ASSESSMENT AND PLAN: .    Problem List Items Addressed This Visit       Cardiology Problems   Essential hypertension (Chronic)   BP well-controlled now on even lower dose carvedilol  3.25 mg twice daily.  No change.      Relevant Orders   EKG 12-Lead (Completed)   CT CARDIAC SCORING (SELF PAY ONLY)   Lipid panel (Completed)   Comprehensive metabolic panel with GFR (Completed)   Mixed hyperlipidemia (Chronic)   Total cholesterol 230 mg/dL, LDL 540 mg/dL. Discussed coronary calcium score's role in treatment decisions. Medication may be needed if lifestyle changes are insufficient. - Order fasting lipid panel and coronary calcium score. - Provide lab slip for lipid panel. - Discuss results and treatment plan based on coronary calcium score and lipid panel.      Paroxysmal tachycardia (HCC) (Chronic)   Well-controlled.  Now on carvedilol  3.125 mg twice daily-reduce her nighttime dose as well. Notably less stressed retirement.  Therefore palpation muscular control.      Relevant Orders   EKG 12-Lead (Completed)   CT CARDIAC SCORING (SELF PAY ONLY)   Lipid panel (Completed)   Comprehensive metabolic panel with GFR (Completed)   Venous insufficiency   Occasional left ankle swelling managed with support hose. Swelling likely due to venous stasis.         Other   Chest pain of uncertain etiology   Pretty much resolved.  Will risk stratify for CAD with coronary calcium score.      Relevant Orders   EKG 12-Lead (Completed)   CT CARDIAC SCORING (SELF PAY ONLY)   Lipid panel (Completed)   Comprehensive metabolic panel with GFR (Completed)   NASH (nonalcoholic steatohepatitis)   Recent liver tests show no hepatic steatosis. Improvement noted, actively managing liver health.      Other Visit Diagnoses       Hyperlipidemia LDL goal <100    -  Primary   Relevant Orders   CT CARDIAC SCORING (SELF PAY ONLY)   Lipid panel (Completed)   Comprehensive metabolic panel with GFR (Completed)              Follow-Up: Return in about 6 months (around 03/28/2024) for [facility]  [employer].     Signed, Arleen Lacer, MD, MS Randene Bustard, M.D., M.S. Interventional Chartered certified accountant  Pager # [phone]

## 2023-09-28 LAB — COMPREHENSIVE METABOLIC PANEL WITH GFR
ALT: 26 IU/L (ref 0–32)
AST: 27 IU/L (ref 0–40)
Albumin: 4.8 g/dL (ref 3.9–4.9)
Alkaline Phosphatase: 80 IU/L (ref 44–121)
BUN/Creatinine Ratio: 22 (ref 12–28)
BUN: 19 mg/dL (ref 8–27)
Bilirubin Total: 0.7 mg/dL (ref 0.0–1.2)
CO2: 22 mmol/L (ref 20–29)
Calcium: 9.8 mg/dL (ref 8.7–10.3)
Chloride: 105 mmol/L (ref 96–106)
Creatinine, Ser: 0.88 mg/dL (ref 0.57–1.00)
Globulin, Total: 1.7 g/dL (ref 1.5–4.5)
Glucose: 91 mg/dL (ref 70–99)
Potassium: 4.9 mmol/L (ref 3.5–5.2)
Sodium: 143 mmol/L (ref 134–144)
Total Protein: 6.5 g/dL (ref 6.0–8.5)
eGFR: 72 mL/min/{1.73_m2} (ref >59)

## 2023-09-28 LAB — LIPID PANEL
Chol/HDL Ratio: 4.3 ratio (ref 0.0–4.4)
Cholesterol, Total: 217 mg/dL — ABNORMAL HIGH (ref 100–199)
HDL: 51 mg/dL (ref >39)
LDL Chol Calc (NIH): 140 mg/dL — ABNORMAL HIGH (ref 0–99)
Triglycerides: 143 mg/dL (ref 0–149)
VLDL Cholesterol Cal: 26 mg/dL (ref 5–40)

## 2023-09-29 ENCOUNTER — Ambulatory Visit: Payer: Self-pay

## 2023-10-01 ENCOUNTER — Encounter: Payer: Self-pay | Admitting: Cardiology

## 2023-10-01 DIAGNOSIS — I872 Venous insufficiency (chronic) (peripheral): Secondary | ICD-10-CM | POA: Insufficient documentation

## 2023-10-01 NOTE — Assessment & Plan Note (Signed)
 Well-controlled.  Now on carvedilol  3.125 mg twice daily-reduce her nighttime dose as well. Notably less stressed retirement.  Therefore palpation muscular control.

## 2023-10-01 NOTE — Assessment & Plan Note (Signed)
 Total cholesterol 230 mg/dL, LDL 784 mg/dL. Discussed coronary calcium score's role in treatment decisions. Medication may be needed if lifestyle changes are insufficient. - Order fasting lipid panel and coronary calcium score. - Provide lab slip for lipid panel. - Discuss results and treatment plan based on coronary calcium score and lipid panel.

## 2023-10-01 NOTE — Assessment & Plan Note (Addendum)
 Pretty much resolved.  Will risk stratify for CAD with coronary calcium score.

## 2023-10-01 NOTE — Assessment & Plan Note (Signed)
 Occasional left ankle swelling managed with support hose. Swelling likely due to venous stasis.

## 2023-10-01 NOTE — Assessment & Plan Note (Signed)
 BP well-controlled now on even lower dose carvedilol  3.25 mg twice daily.  No change.

## 2023-10-01 NOTE — Assessment & Plan Note (Signed)
 Recent liver tests show no hepatic steatosis. Improvement noted, actively managing liver health.

## 2023-10-05 ENCOUNTER — Ambulatory Visit (HOSPITAL_BASED_OUTPATIENT_CLINIC_OR_DEPARTMENT_OTHER)
Admission: RE | Admit: 2023-10-05 | Discharge: 2023-10-05 | Disposition: A | Discharge status: Home / Self Care | Payer: Self-pay | Source: Ambulatory Visit | Attending: Cardiology | Admitting: Cardiology

## 2023-10-05 DIAGNOSIS — I479 Paroxysmal tachycardia, unspecified: Secondary | ICD-10-CM | POA: Insufficient documentation

## 2023-10-05 DIAGNOSIS — I10 Essential (primary) hypertension: Secondary | ICD-10-CM | POA: Insufficient documentation

## 2023-10-05 DIAGNOSIS — R079 Chest pain, unspecified: Secondary | ICD-10-CM | POA: Insufficient documentation

## 2023-10-05 DIAGNOSIS — E785 Hyperlipidemia, unspecified: Secondary | ICD-10-CM | POA: Insufficient documentation

## 2023-10-09 ENCOUNTER — Ambulatory Visit: Admitting: Gastroenterology

## 2023-10-09 ENCOUNTER — Encounter: Payer: Self-pay | Admitting: Gastroenterology

## 2023-10-09 ENCOUNTER — Ambulatory Visit: Payer: Self-pay | Admitting: Gastroenterology

## 2023-10-09 ENCOUNTER — Ambulatory Visit (INDEPENDENT_AMBULATORY_CARE_PROVIDER_SITE_OTHER)
Admission: RE | Admit: 2023-10-09 | Discharge: 2023-10-09 | Disposition: A | Discharge status: Home / Self Care | Source: Ambulatory Visit | Attending: Gastroenterology

## 2023-10-09 VITALS — BP 122/82 | HR 74 | Ht 66.0 in | Wt 173.5 lb

## 2023-10-09 DIAGNOSIS — K5904 Chronic idiopathic constipation: Secondary | ICD-10-CM

## 2023-10-09 DIAGNOSIS — R110 Nausea: Secondary | ICD-10-CM

## 2023-10-09 DIAGNOSIS — R140 Abdominal distension (gaseous): Secondary | ICD-10-CM | POA: Diagnosis not present

## 2023-10-09 DIAGNOSIS — K5900 Constipation, unspecified: Secondary | ICD-10-CM | POA: Diagnosis not present

## 2023-10-09 DIAGNOSIS — K219 Gastro-esophageal reflux disease without esophagitis: Secondary | ICD-10-CM

## 2023-10-09 NOTE — Progress Notes (Signed)
 Chief Complaint: chronic constipation, nausea Primary GI Doctor: Dr. Leigh  HPI: Patient is a  68  year old female patient with past medical history of  chronic constipation, diverticulitis, adenomatous colon polyps, Factor V Leiden mutation, NAFLD, hepatic hemangiomas NAFLD (2014), who was referred to me by Booker Darice SAUNDERS, FNP on 05/02/23 for a complaint of NASH discussion and pt has hx of liver hemangiomas  .    On 07/13/2021 patient last seen in GI office for colonoscopy with Dr. Leigh for recurrent diverticulitis. Last exam done 12/2017 with some adenomas removed. Was told to repeat in 5 years but she has had multiple episodes of sigmoid diverticulitis since her last exam.  Last episode in January.   Colonoscopy 07/13/21, recall 06/2028 Impression:  - Perianal skin tags found on perianal exam.  - One 3 mm polyp in the cecum, removed with a cold snare. Resected and retrieved.  - Diverticulosis in the sigmoid colon.  - Internal hemorrhoids.  - There was significant looping of the colon.  - The examination was otherwise normal. Path: EGD 11/22/19 Impression:  - Esophagogastric landmarks identified. - Normal esophagus otherwise  - A few benign appearing gastric polyps. Two representative samples resected and retrieved.  - Normal stomach otherwise  - biopsies taken to rule out H pylori  - Normal duodenal bulb and second portion of the duodenum. Biopsied. Path: Colonoscopy 12/25/2017- 2 small polyps < 10 mm   EGD 10/26/2012  - gastritis and mild esophagitis  - no further details provided, done in [place], biopsies negative for HP  Imaging:  US  12/2012 - fatty liver, suspected hepatic hemangiomas,  CT abdomen 06/29/2016 - hepatic hemangiomas, post chole, diverticulosis US  pelvis - 10/11/17 - no inguinal hernia, s/p hysterectomy US  Abd complete- 02/15/19- 2 hyperechoic liver lesions, potentially representing hemangiomas. CT Abd/pelvis W contrast- 07/26/2019- acute diverticulitis of the  descending/sigmoid colon. Vague area of low density in the left hepatic lobe lateral segment measuring 1.4 x 1.1 cm. MR Abd W WO contrast- 08/31/2019- The 2 lesions of concern within the posterior right hepatic lobe and lateral segment of left lobe are technically too small to reliably characterize. Favor benign cyst or biliary hamartoma. Within the limitation of their small size no suspicious imaging features are identified at this time without convincing evidence for enhancement CT Abd/pelvis WO contrast--03/23/22--No acute intra-abdominal or pelvic pathology. Sigmoid diverticulosis. No bowel obstruction. Normal appendix. CT Abdomen WO contrast --05/04/23 -- Subtle hypoattenuating lesions within the liver measuring up to 1.3 cm, unchanged when compared to the prior CT from 2023. These are favored to represent benign lesions such as hemangiomas, hamartomas, or cysts. New mild superior endplate compression deformity at T10, likely subacute. Correlate with point tenderness.  Interval History         Patients main complaint of a inflammation flare few months ago where she states she had inflammation and swelling in upper/lower extremities. She went on a whole30 diet to try to help with the inflammation which she states she gained weight. She was put on water pill for a few days and antibiotics Augmentin  which she states helped some. She also saw her cardiologist who ordered CT cardiac scoring, pending follow-up for results.  She states she has had nausea daily. She has antiemetics to use if needed.    She has history of GERD and taking Pantoprazole  40 mg po daily every other night  Patient denies dysphagia.     She states she has itching across her lower abdomen where her  hysterectomy scar was with concerns for internal parasites.  She has history of chronic constipation. I gave her samples of Linzess  last appointment she states it caused a lot of bloating and abdominal pain so she stopped it.  She is  currently taking OTC Miralax prn and fiber supplements daily. She uses Ducolax prn if she has severe constipation.   Patient is going to [place] next month to visit her son.   Wt Readings from Last 3 Encounters:  10/09/23 173 lb 8 oz (78.7 kg)  09/27/23 178 lb 14.4 oz (81.1 kg)  09/14/23 176 lb (79.8 kg)    Past Medical History:  Diagnosis Date   Allergy    Arthritis    lower back   Cataract    Colon polyps    Diverticulitis    Elevated cholesterol    Endometriosis 02/02/2017   Establishing care with new doctor, encounter for 08/31/2023   Factor V Leiden (HCC) 02/02/2017   GERD (gastroesophageal reflux disease)    IBS (irritable bowel syndrome)    Liver hemangioma 02/02/2017   Low back pain    Lumbar radiculopathy    NASH (nonalcoholic steatohepatitis) 02/02/2017   Scoliosis    Spinal stenosis of lumbar region     Past Surgical History:  Procedure Laterality Date   ABDOMINAL HYSTERECTOMY  1998   TOTAL, on estrogen  until 2012   BACK SURGERY  2000   disc removed L4L5   CHOLECYSTECTOMY     COLONOSCOPY     ESOPHAGOGASTRODUODENOSCOPY  2014   In [place]   EYE SURGERY     Cataracts   HEMORRHOID SURGERY  1996   LAPAROSCOPIC ENDOMETRIOSIS FULGURATION     x 6, 1980-1991   UPPER GASTROINTESTINAL ENDOSCOPY      Current Outpatient Medications  Medication Sig Dispense Refill   aspirin EC 81 MG tablet Take 81 mg by mouth as needed.     B Complex Vitamins (VITAMIN B COMPLEX PO) vitamin B complex     carvedilol  (COREG ) 3.125 MG tablet Take 1 tablet (3.125 mg total) by mouth 2 (two) times daily. 180 tablet 2   Cholecalciferol (D3 PO) Take 4,000 mg by mouth daily.     magnesium oxide (MAG-OX) 400 (240 Mg) MG tablet Take 400 mg by mouth daily.     metroNIDAZOLE  (METROCREAM ) 0.75 % cream Apply 1 Application topically as needed.     NON FORMULARY Smooth Move  1 cup of tea by mouth as needed     OMEGA-3 FATTY ACIDS PO Take 1 capsule by mouth daily.     pantoprazole  (PROTONIX ) 40 MG  tablet Take 1 tablet (40 mg total) by mouth daily. (Patient taking differently: Take 40 mg by mouth every other day.) 90 tablet 0   polyethylene glycol (MIRALAX / GLYCOLAX) 17 g packet Take 17 g by mouth as needed.     No current facility-administered medications for this visit.    Allergies as of 10/09/2023 - Review Complete 10/09/2023  Allergen Reaction Noted   Morphine and codeine Swelling and Rash 02/02/2017    Family History  Problem Relation Age of Onset   Cirrhosis Mother    Diabetes Mother    Arthritis Mother    Gout Mother    Depression Mother    Bowel Disease Mother    Heart disease Father    Diabetes Father    Heart attack Father 40       during knee surgery   Valvular heart disease Sister  had valve surgery   Depression Sister    Diabetes Brother    Factor V Leiden deficiency Brother        with blood clots   Hyperlipidemia Son    Cancer Maternal Grandmother 94       colon cancer   Colon cancer Maternal Grandmother    Atrial fibrillation Brother    Valvular heart disease Brother        valve surgery   Hypertension Brother    Esophageal cancer Neg Hx    Rectal cancer Neg Hx    Stomach cancer Neg Hx     Review of Systems:    Constitutional: No weight loss, fever, chills, weakness or fatigue HEENT: Eyes: No change in vision               Ears, Nose, Throat:  No change in hearing or congestion Skin: No rash or itching Cardiovascular: No chest pain, chest pressure or palpitations   Respiratory: No SOB or cough Gastrointestinal: See HPI and otherwise negative Genitourinary: No dysuria or change in urinary frequency Neurological: No headache, dizziness or syncope Musculoskeletal: No new muscle or joint pain Hematologic: No bleeding or bruising Psychiatric: No history of depression or anxiety    Physical Exam:  Vital signs: BP 122/82   Pulse 74   Ht 5' 6 (1.676 m)   Wt 173 lb 8 oz (78.7 kg)   SpO2 98%   BMI 28.00 kg/m   Constitutional:   Pleasant Caucasian female appears to be in NAD, Well developed, Well nourished, alert and cooperative Throat: Oral cavity and pharynx without inflammation, swelling or lesion.  Respiratory: Respirations even and unlabored. Lungs clear to auscultation bilaterally.   No wheezes, crackles, or rhonchi.  Cardiovascular: Normal S1, S2. Regular rate and rhythm. No peripheral edema, cyanosis or pallor.  Gastrointestinal:  Soft, nondistended, generalized abd tenderness. Obese No rebound or guarding. Normal bowel sounds. No appreciable masses or hepatomegaly. Rectal:  Not performed.  Msk:  Symmetrical without gross deformities. Without edema, no deformity or joint abnormality.  Neurologic:  Alert and  oriented x4;  grossly normal neurologically.  Skin:   Dry and intact without significant lesions or rashes. Psychiatric: Oriented to person, place and time. Demonstrates good judgement and reason without abnormal affect or behaviors.  RELEVANT LABS AND IMAGING: CBC    Latest Ref Rng & Units 08/07/2023   11:01 AM 01/24/2023    9:09 AM 05/24/2022    2:32 PM  CBC  WBC 3.4 - 10.8 x10E3/uL 5.0  4.6  5.6   Hemoglobin 11.1 - 15.9 g/dL 85.9  86.2  86.5   Hematocrit 34.0 - 46.6 % 42.9  42.0  39.6   Platelets 150 - 450 x10E3/uL 242  237.0  249.0      CMP     Latest Ref Rng & Units 09/27/2023   12:05 PM 08/07/2023   11:01 AM 01/24/2023    9:09 AM  CMP  Glucose 70 - 99 mg/dL 91  94  895   BUN 8 - 27 mg/dL 19  16  17    Creatinine 0.57 - 1.00 mg/dL 9.11  9.17  9.26   Sodium 134 - 144 mmol/L 143  141  143   Potassium 3.5 - 5.2 mmol/L 4.9  4.3  4.0   Chloride 96 - 106 mmol/L 105  106  106   CO2 20 - 29 mmol/L 22  22  27    Calcium 8.7 - 10.3 mg/dL 9.8  9.3  9.5  Total Protein 6.0 - 8.5 g/dL 6.5  6.3  6.3   Total Bilirubin 0.0 - 1.2 mg/dL 0.7  0.6  0.9   Alkaline Phos 44 - 121 IU/L 80  63  56   AST 0 - 40 IU/L 27  21  18    ALT 0 - 32 IU/L 26  20  22       Lab Results  Component Value Date   TSH 1.540  08/07/2023    Assessment: Encounter Diagnoses  Name Primary?   Chronic idiopathic constipation Yes   Gastroesophageal reflux disease without esophagitis    Nausea without vomiting      68 year old female patient with history of CIC and presents with generalized ab pain and nausea. She recently was treated by her PCP with Augmentin  which should have covered any enteric or diverticular infection. Recent CT scan abd/pelvis from Jan unremarkable. Colonoscopy UTD (3/23). We discussed ordering KUB to evaluate if she has large stool burden and if this is the cause of her current symptoms. I explained to her she needs to take the OTC Miralax more regularly to prevent backflow. She did not respond well to the pro secretory agent. She will continue the fiber supplementation as well. Patient does not have diarrhea therefore I explained to her I don't think she has parasites or think the itching along her hysterectomy line is related to such. She can try topical anti itch or benadryl  topicals.      Her GERD is managed with above regimen, thus no changes needed.   Plan: -Continue OTC Miralax po daily -Continue fiber supplement po daily -Order Abdominal KUB 2 view STAT -Recall colon 06/2028 - Follow-up in 3 months with Dr. Leigh   Thank you for the courtesy of this consult. Please call me with any questions or concerns.   Parisa Pinela, FNP-C [facility] 10/09/2023, 10:20 AM  Cc: Booker Darice SAUNDERS, FNP

## 2023-10-09 NOTE — Patient Instructions (Addendum)
-  Continue OTC Miralax po daily -Continue fiber supplement po daily -Recommend high fiber diet  Your provider has requested that you have an abdominal x ray before leaving today. Please go to the basement floor to our Radiology department for the test.  _______________________________________________________  If your blood pressure at your visit was 140/90 or greater, please contact your primary care physician to follow up on this.  _______________________________________________________  If you are age 20 or older, your body mass index should be between 23-30. Your Body mass index is 28 kg/m. If this is out of the aforementioned range listed, please consider follow up with your Primary Care Provider.  If you are age 53 or younger, your body mass index should be between 19-25. Your Body mass index is 28 kg/m. If this is out of the aformentioned range listed, please consider follow up with your Primary Care Provider.   ________________________________________________________  The [facility] providers would like to encourage you to use MYCHART to communicate with providers for non-urgent requests or questions.  Due to long hold times on the telephone, sending your provider a message by [facility] may be a faster and more efficient way to get a response.  Please allow 48 business hours for a response.  Please remember that this is for non-urgent requests.  _______________________________________________________  Thank you for trusting me with your gastrointestinal care. Deanna May, RNP

## 2023-10-09 NOTE — Progress Notes (Signed)
 Agree with assessment and plan as outlined.

## 2023-10-13 ENCOUNTER — Encounter: Payer: Self-pay | Admitting: Family Medicine

## 2023-10-16 ENCOUNTER — Other Ambulatory Visit: Payer: Self-pay | Admitting: Family Medicine

## 2023-10-16 MED ORDER — PANTOPRAZOLE SODIUM 40 MG PO TBEC: 40.0000 mg | DELAYED_RELEASE_TABLET | Freq: Every day | ORAL | 0 refills | Status: DC

## 2023-10-16 NOTE — Telephone Encounter (Signed)
 Copied from CRM [id number]. Topic: Clinical - Medication Refill >> Oct 16, 2023  9:19 AM Tanazia G wrote: Medication:  pantoprazole  (PROTONIX ) 40 MG tablet  Has the patient contacted their pharmacy? Yes (Agent: If no, request that the patient contact the pharmacy for the refill. If patient does not wish to contact the pharmacy document the reason why and proceed with request.) (Agent: If yes, when and what did the pharmacy advise?)  This is the patient's preferred pharmacy:  [facility] [id number] [place], [place] - [address] [place] [address] Phone: [phone] Fax: [phone]  Is this the correct pharmacy for this prescription? Yes If no, delete pharmacy and type the correct one.   Has the prescription been filled recently? Yes  Is the patient out of the medication? Yes  Has the patient been seen for an appointment in the last year OR does the patient have an upcoming appointment? Yes  Can we respond through MyChart? Yes  Agent: Please be advised that Rx refills may take up to 3 business days. We ask that you follow-up with your pharmacy.

## 2023-11-01 ENCOUNTER — Ambulatory Visit (INDEPENDENT_AMBULATORY_CARE_PROVIDER_SITE_OTHER): Admitting: Family Medicine

## 2023-11-01 ENCOUNTER — Ambulatory Visit

## 2023-11-01 ENCOUNTER — Encounter: Payer: Self-pay | Admitting: Family Medicine

## 2023-11-01 VITALS — BP 133/84 | HR 71 | Temp 99.0°F | Ht 66.0 in | Wt 169.0 lb

## 2023-11-01 DIAGNOSIS — M25551 Pain in right hip: Secondary | ICD-10-CM

## 2023-11-01 DIAGNOSIS — M47816 Spondylosis without myelopathy or radiculopathy, lumbar region: Secondary | ICD-10-CM | POA: Diagnosis not present

## 2023-11-01 NOTE — Progress Notes (Signed)
   Acute Office Visit  Subjective:     Patient ID: Samantha Mckinney, female    DOB: Aug 08, 1955, 68 y.o.   MRN: [id number]  Chief Complaint  Patient presents with   Groin Pain    Feels it may be a hernia    HPI Patient is in today for right  groin pain.  Pain present for one month. Worse with lifting right leg. Uses treadmill and bike and feels this pain.  Never had this pain before.  Does not recall an injury. Swelling in the area when pain started.  Right hip pain to palpation.  History of bursitis in left hip. No known issues with right hip.  Does stretches in hip and back daily.    ROS      Objective:    BP 133/84 (BP Location: Left Arm, Patient Position: Sitting, Cuff Size: Normal)   Pulse 71   Temp 99 F (37.2 C) (Oral)   Ht 5' 6 (1.676 m)   Wt 169 lb (76.7 kg)   SpO2 98%   BMI 27.28 kg/m  BP Readings from Last 3 Encounters:  11/01/23 133/84  10/09/23 122/82  09/27/23 118/78      Physical Exam Vitals and nursing note reviewed.  Constitutional:      General: She is not in acute distress.    Appearance: Normal appearance.  Cardiovascular:     Rate and Rhythm: Normal rate.  Pulmonary:     Effort: Pulmonary effort is normal.  Musculoskeletal:     Right hip: Bony tenderness present. No deformity. Normal range of motion.  Skin:    General: Skin is warm and dry.  Neurological:     General: No focal deficit present.     Mental Status: She is alert. Mental status is at baseline.  Psychiatric:        Mood and Affect: Mood normal.        Behavior: Behavior normal.        Thought Content: Thought content normal.        Judgment: Judgment normal.     No results found for any visits on 11/01/23.      Assessment & Plan:   Problem List Items Addressed This Visit     Acute hip pain, right - Primary   No known injury to right hip. There is tenderness to palpation. ROM is intact. Pain radiating to right groin. No obvious bulge noted in groin.  Send  for x-ray of right hip today. Will use OTC naproxen twice per day with food for next 2 weeks. Heating pad. Follow-up if conservative treatment is not effective in relieving pain. May benefit from ortho specialist if symptoms do not resolve.       Relevant Orders   DG Hip Unilat W OR W/O Pelvis 2-3 Views Right  Agrees with plan of care discussed.  Questions answered.      Return if symptoms worsen or fail to improve.  Darice JONELLE Brownie, FNP

## 2023-11-01 NOTE — Assessment & Plan Note (Addendum)
 No known injury to right hip. There is tenderness to palpation. ROM is intact. Pain radiating to right groin. No obvious bulge noted in groin.  Send for x-ray of right hip today. Will use OTC naproxen twice per day with food for next 2 weeks. Heating pad. Follow-up if conservative treatment is not effective in relieving pain. May benefit from ortho specialist if symptoms do not resolve.

## 2023-11-02 ENCOUNTER — Ambulatory Visit: Payer: Self-pay | Admitting: Family Medicine

## 2023-12-04 DIAGNOSIS — M7632 Iliotibial band syndrome, left leg: Secondary | ICD-10-CM | POA: Diagnosis not present

## 2023-12-04 DIAGNOSIS — M25552 Pain in left hip: Secondary | ICD-10-CM | POA: Diagnosis not present

## 2023-12-04 DIAGNOSIS — M25551 Pain in right hip: Secondary | ICD-10-CM | POA: Diagnosis not present

## 2023-12-11 ENCOUNTER — Other Ambulatory Visit: Payer: Self-pay | Admitting: Family Medicine

## 2023-12-11 ENCOUNTER — Encounter: Payer: Self-pay | Admitting: Family Medicine

## 2023-12-11 DIAGNOSIS — H9190 Unspecified hearing loss, unspecified ear: Secondary | ICD-10-CM | POA: Insufficient documentation

## 2023-12-14 DIAGNOSIS — H903 Sensorineural hearing loss, bilateral: Secondary | ICD-10-CM | POA: Diagnosis not present

## 2023-12-14 DIAGNOSIS — H938X3 Other specified disorders of ear, bilateral: Secondary | ICD-10-CM | POA: Diagnosis not present

## 2023-12-14 DIAGNOSIS — H6123 Impacted cerumen, bilateral: Secondary | ICD-10-CM | POA: Diagnosis not present

## 2023-12-20 DIAGNOSIS — M25559 Pain in unspecified hip: Secondary | ICD-10-CM | POA: Diagnosis not present

## 2023-12-27 DIAGNOSIS — M25559 Pain in unspecified hip: Secondary | ICD-10-CM | POA: Diagnosis not present

## 2024-01-03 DIAGNOSIS — M25559 Pain in unspecified hip: Secondary | ICD-10-CM | POA: Diagnosis not present

## 2024-01-11 ENCOUNTER — Encounter: Payer: Self-pay | Admitting: Family Medicine

## 2024-01-12 ENCOUNTER — Other Ambulatory Visit: Payer: Self-pay | Admitting: Family Medicine

## 2024-01-12 MED ORDER — PANTOPRAZOLE SODIUM 40 MG PO TBEC: 40.0000 mg | DELAYED_RELEASE_TABLET | Freq: Every day | ORAL | 1 refills | Status: AC

## 2024-02-19 DIAGNOSIS — L245 Irritant contact dermatitis due to other chemical products: Secondary | ICD-10-CM | POA: Diagnosis not present

## 2024-02-19 DIAGNOSIS — L501 Idiopathic urticaria: Secondary | ICD-10-CM | POA: Diagnosis not present

## 2024-02-22 ENCOUNTER — Ambulatory Visit: Payer: Self-pay

## 2024-02-22 NOTE — Telephone Encounter (Signed)
 FYI Only or Action Required?: FYI only for provider: appointment scheduled on 02/23/24.  Patient was last seen in primary care on 11/01/2023 by Booker Darice SAUNDERS, FNP.  Called Nurse Triage reporting Insect Bite.  Symptoms began several weeks ago.  Interventions attempted: OTC medications: Claritin, Benadryl , moisturizer and Prescription medications: Triamcinolone  Cream.  Symptoms are: gradually worsening.  Triage Disposition: See PCP When Office is Open (Within 3 Days)  Patient/caregiver understands and will follow disposition?: Yes  Reason for Disposition  [1] After 14 days AND [2] insect bite isn't healed  Answer Assessment - Initial Assessment Questions Bit by possible unknown insect on right bicep 3.5 weeks ago. Saw dermatologist on 11/03 for the bite and rash on chest, prescribed Claritin, benadryl  and an triamcinolone  cream. Reports improvement in chest rash and itching of red spot on right bicep. Red spot has increased in size slightly. New onset rash on upper thighs. Denies fever or SOB. Scheduled for appt with PCP tomorrow, advised UC or ED for worsening symptoms.  1. TYPE of INSECT: What type of insect was it?      Unsure, possibly a tick  2. ONSET: When did you get bitten?      3.5 weeks ago  3. LOCATION: Where is the insect bite located?      Right bicep  4. REDNESS: Is the area red or pink? If Yes, ask: What size is the area of redness? (inches or cm). When did the redness start?     Bright red circle the size of a silver dollar, small speck in the center  5. PAIN: Is there any pain? If Yes, ask: How bad is the pain? (Scale 0-10; or none, mild, moderate, severe)     Denies pain  6. ITCHING: Does it itch? If Yes, ask: How bad is the itch?      Was itching, no more itching after starting benadryl , Claritin and topical triamcinolone   7. SWELLING: How big is the swelling? (e.g., inches, cm, or compare to coins)     Bright red circle the size of a  silver dollar  8. OTHER SYMPTOMS: Do you have any other symptoms?  (e.g., difficulty breathing, fever, hives)     Rash on upper thighs, denies fever or SOB.  Protocols used: Insect Bite-A-AH

## 2024-02-22 NOTE — Telephone Encounter (Signed)
 After being transferred the patient stated she had to go to take another call and will call us  back. Routing to call backs.      Copied from CRM #[id number]. Topic: Clinical - Red Word Triage >> Feb 22, 2024  3:04 PM Samantha Mckinney wrote: Red Word that prompted transfer to Nurse Triage: patient was bit by bug/spider on  3 1/2 weeks ago  -bit on right side  bicep inside arm  -Monday 02/19/24 dertmalogist gave her cream and said it was eczema and to take clariton and benadryl   -red  -patient feels like some more to it

## 2024-02-23 ENCOUNTER — Encounter: Payer: Self-pay | Admitting: Family Medicine

## 2024-02-23 ENCOUNTER — Ambulatory Visit: Admitting: Family Medicine

## 2024-02-23 VITALS — BP 133/83 | HR 81 | Temp 97.9°F | Ht 66.0 in | Wt 164.0 lb

## 2024-02-23 DIAGNOSIS — R21 Rash and other nonspecific skin eruption: Secondary | ICD-10-CM

## 2024-02-23 MED ORDER — DEXAMETHASONE SOD PHOSPHATE PF 10 MG/ML IJ SOLN
10.0000 mg | Freq: Once | INTRAMUSCULAR | Status: AC
  Administered 2024-02-23: 10 mg via INTRAMUSCULAR

## 2024-02-23 NOTE — Assessment & Plan Note (Addendum)
 Diffuse erythematous rash on neck, chest and upper thigh. No facial swelling or respiratory concerns.  Saw derm who prescribed steroid topical cream.  Recently traveled and used H&r block, she usually uses Free ad Clear. Taking Benadryl  which has helped the itching. Discussed treatment options, dexamethasone  10 mg IM once. Stop steroid cream per derm for next 24 hours. Okay to use benadryl  at night. Follow-up if symptoms do not resolve, reports some improvement  in symptoms before today.

## 2024-02-23 NOTE — Progress Notes (Signed)
   Acute Office Visit  Subjective:     Patient ID: Samantha Mckinney, female    DOB: 09-10-1955, 68 y.o.   MRN: [id number]  Chief Complaint  Patient presents with   Insect Bite    Right upper arm. Using Triamcinolone  cream   Rash    Chest, neck and legs. Using Triamcinolone  cream, Claritin and Benadryl     HPI Patient is in today for insect bite and rash. Does not appear infected today. Erythematous rash on neck and chest. Saw derm on Monday. Prescribed triamcinolone  cream. Aveeno cream has helped some too. Claritin and Benadryl   Itching has resolved. Rash has now moved to inner thighs. Right upper arm with erythematous area from insect bite.  Used Tide laundry soap while out of town. No respiratory symptoms. No facial swelling.        ROS      Objective:    BP 133/83 (BP Location: Right Arm, Patient Position: Standing, Cuff Size: Normal)   Pulse 81   Temp 97.9 F (36.6 C) (Oral)   Ht 5' 6 (1.676 m)   Wt 164 lb (74.4 kg)   SpO2 98%   BMI 26.47 kg/m    Physical Exam Vitals and nursing note reviewed.  Constitutional:      General: She is not in acute distress.    Appearance: Normal appearance.  Cardiovascular:     Rate and Rhythm: Normal rate and regular rhythm.     Heart sounds: Normal heart sounds.  Pulmonary:     Effort: Pulmonary effort is normal.     Breath sounds: Normal breath sounds.  Skin:    General: Skin is warm and dry.     Comments: Diffuse erythmatous rash on neck, chest and upper thighs (did not visualize thighs).   Neurological:     General: No focal deficit present.     Mental Status: She is alert. Mental status is at baseline.  Psychiatric:        Mood and Affect: Mood normal.        Behavior: Behavior normal.        Thought Content: Thought content normal.        Judgment: Judgment normal.     No results found for any visits on 02/23/24.      Assessment & Plan:   Problem List Items Addressed This Visit     Rash and  nonspecific skin eruption - Primary   Diffuse erythematous rash on neck, chest and upper thigh. No facial swelling or respiratory concerns.  Saw derm who prescribed steroid topical cream.  Recently traveled and used H&r block, she usually uses Free ad Clear. Taking Benadryl  which has helped the itching. Discussed treatment options, dexamethasone  10 mg IM once. Stop steroid cream per derm for next 24 hours. Okay to use benadryl  at night. Follow-up if symptoms do not resolve, reports some improvement  in symptoms before today.        Meds ordered this encounter  Medications   dexamethasone  (DECADRON ) injection 10 mg  Agrees with plan of care discussed.  Questions answered.   Return if symptoms worsen or fail to improve.  Darice JONELLE Brownie, FNP

## 2024-03-21 ENCOUNTER — Other Ambulatory Visit: Payer: Self-pay | Admitting: Cardiology

## 2024-03-25 ENCOUNTER — Ambulatory Visit: Attending: Cardiology | Admitting: Cardiology

## 2024-03-25 ENCOUNTER — Encounter: Payer: Self-pay | Admitting: Cardiology

## 2024-03-25 VITALS — BP 130/77 | HR 79 | Resp 16 | Ht 66.0 in | Wt 167.6 lb

## 2024-03-25 DIAGNOSIS — E785 Hyperlipidemia, unspecified: Secondary | ICD-10-CM | POA: Diagnosis not present

## 2024-03-25 DIAGNOSIS — I10 Essential (primary) hypertension: Secondary | ICD-10-CM

## 2024-03-25 DIAGNOSIS — R931 Abnormal findings on diagnostic imaging of heart and coronary circulation: Secondary | ICD-10-CM | POA: Diagnosis not present

## 2024-03-25 DIAGNOSIS — E782 Mixed hyperlipidemia: Secondary | ICD-10-CM | POA: Diagnosis not present

## 2024-03-25 DIAGNOSIS — I479 Paroxysmal tachycardia, unspecified: Secondary | ICD-10-CM

## 2024-03-25 NOTE — Assessment & Plan Note (Signed)
 Well-controlled with carvedilol  3.125 mg twice daily. Encourage adequate hydration. Encouraged continued activity and exercise.

## 2024-03-25 NOTE — Progress Notes (Signed)
 Cardiology Office Note:  .   Date:  03/25/2024  ID:  Samantha Mckinney, DOB [birth year], MRN [id number] PCP: Booker Darice SAUNDERS, FNP  [facility] Providers Cardiologist:  Alm Clay, MD     Chief Complaint  Patient presents with   Hyperlipidemia LDL goal <100   Follow-up    6 months    Patient Profile: .     Samantha Mckinney is a 68 y.o. female with a PMH notable for HTN, HLD, paroxysmal tachycardia and factor V Leiden deficiency as well as NASH who presents here for 39-month follow-up to discuss test results at the request of Booker Darice SAUNDERS, FNP.     Samantha Mckinney was last seen on September 27, 2023.  We discussed her family history of father and mother with CAD.  She was very active engaging in treadmill or bike exercises 3 times a week.  High stress job that she is recently retired from.  For restratification we checked a coronary calcium score  Subjective  Discussed the use of AI scribe software for clinical note transcription with the patient, who gave verbal consent to proceed.  History of Present Illness Samantha Mckinney is a 68 year old female with high blood pressure, mixed dyslipidemia, and paroxysmal tachycardia who presents for a six-month follow-up to discuss her coronary calcium score results.  She underwent a coronary calcium score on October 05, 2023, which revealed a score of 11.4 in the LAD. Previously, she experienced chest pain, which has since resolved.  Her lipid panel from June showed a total cholesterol of 217 mg/dL and an LDL of 859 mg/dL. She has not had her cholesterol checked since then. She opted for dietary changes and weight loss instead of starting medication. Since joining a gym in June, she has lost weight and increased her physical activity.  She denies being diabetic and does not smoke. Her home scale shows a weight of 164 pounds, down from a higher weight previously.  Very reluctant to take statin.   Cardiovascular ROS: no chest pain or dyspnea on  exertion negative for - edema, irregular heartbeat, orthopnea, palpitations, paroxysmal nocturnal dyspnea, rapid heart rate, shortness of breath, or lightheadedness, dizziness or wooziness, syncope or near syncope event TIA or emesis fugax, claudication     Objective   Current Meds  Medication Sig   aspirin EC 81 MG tablet Take 81 mg by mouth as needed.   B Complex Vitamins (VITAMIN B COMPLEX PO) vitamin B complex   carvedilol  (COREG ) 3.125 MG tablet TAKE 1 TABLET BY MOUTH 2 TIMES A DAY   Cholecalciferol (D3 PO) Take 4,000 mg by mouth daily.   magnesium oxide (MAG-OX) 400 (240 Mg) MG tablet Take 400 mg by mouth daily.   metroNIDAZOLE  (METROCREAM ) 0.75 % cream Apply 1 Application topically as needed.   NON FORMULARY Smooth Move  1 cup of tea by mouth as needed   OMEGA-3 FATTY ACIDS PO Take 1 capsule by mouth daily.   pantoprazole  (PROTONIX ) 40 MG tablet Take 1 tablet (40 mg total) by mouth daily.   polyethylene glycol (MIRALAX / GLYCOLAX) 17 g packet Take 17 g by mouth as needed.   triamcinolone  cream (KENALOG ) 0.1 % SMARTSIG:2 Topical Twice Daily    Studies Reviewed: SABRA        Lab Results  Component Value Date   CHOL 217 (H) 09/27/2023   HDL 51 09/27/2023   LDLCALC 140 (H) 09/27/2023   TRIG 143 09/27/2023   CHOLHDL 4.3 09/27/2023   Results  LABS Lipid panel: Total cholesterol 217 mg/dL, LDL 859 mg/dL (93/7974)  RADIOLOGY Coronary calcium score: 11.4 in the LAD (10/05/2023)  Risk Assessment/Calculations:              Physical Exam:   VS:  BP 130/77 (BP Location: Left Arm, Patient Position: Sitting, Cuff Size: Normal)   Pulse 79   Resp 16   Ht 5' 6 (1.676 m)   Wt 167 lb 9.6 oz (76 kg)   SpO2 98%   BMI 27.05 kg/m    Wt Readings from Last 3 Encounters:  03/25/24 167 lb 9.6 oz (76 kg)  02/23/24 164 lb (74.4 kg)  11/01/23 169 lb (76.7 kg)    GEN: Well nourished, well groomed; in no acute distress; otherwise healthy appearing. NECK: No JVD;  RESPIRATORY:   nonlabored, good air movement. EXTREMITIES:  No edema; No deformity      ASSESSMENT AND PLAN: .    Problem List Items Addressed This Visit       Cardiology Problems   Essential hypertension (Chronic)   Blood pressure well-controlled with carvedilol  3.125 mg twice daily.  Beta-blocker chosen because of history of tachycardia episodes.. - Continue current blood pressure management regimen.      Relevant Orders   Comprehensive metabolic panel with GFR   Mixed hyperlipidemia (Chronic)   LDL cholesterol was 140 mg/dL, high risk. Total cholesterol was 217 mg/dL. Goal: reduce LDL to <100 mg/dL. Non-statin options and supplements discussed. - Ordered fasting lipid panel to assess current cholesterol levels. - Consider Zetia or Nexletol if LDL remains high after lifestyle modifications. - Encouraged continued diet, exercise, and weight loss efforts.      Relevant Orders   Lipid panel   Comprehensive metabolic panel with GFR   Lipoprotein A (LPA)   Paroxysmal tachycardia (HCC) (Chronic)   Well-controlled with carvedilol  3.125 mg twice daily. Encourage adequate hydration. Encouraged continued activity and exercise.        Other   Agatston CAC score, <100 - Primary (Chronic)   Coronary artery disease with low coronary calcium score Coronary calcium score of 11.4 indicates low risk of significant coronary artery disease. Plaque present but not significant. No need for aggressive treatment. - Recheck coronary calcium score in five years.      Other Visit Diagnoses       Hyperlipidemia LDL goal <100       Relevant Orders   Lipid panel   Comprehensive metabolic panel with GFR   Lipoprotein A (LPA)            Follow-Up: Return in about 1 year (around 03/25/2025) for Routine follow up with me, [employer].    Signed, Alm MICAEL Clay, MD, MS Alm Clay, M.D., M.S. Interventional Cardiologist  [facility] Pager # [phone]

## 2024-03-25 NOTE — Assessment & Plan Note (Signed)
 Blood pressure well-controlled with carvedilol  3.125 mg twice daily.  Beta-blocker chosen because of history of tachycardia episodes.. - Continue current blood pressure management regimen.

## 2024-03-25 NOTE — Patient Instructions (Addendum)
 Medication Instructions:   Not needed *If you need a refill on your cardiac medications before your next appointment, please call your pharmacy*   Lab Work: Lpa Lipid CMP If you have labs (blood work) drawn today and your tests are completely normal, you will receive your results only by: MyChart Message (if you have MyChart) OR A paper copy in the mail If you have any lab test that is abnormal or we need to change your treatment, we will call you to review the results.   Testing/Procedures: NOT NEEDED    Follow-Up: At [facility], you and your health needs are our priority.  As part of our continuing mission to provide you with exceptional heart care, we have created designated Provider Care Teams.  These Care Teams include your primary Cardiologist (physician) and Advanced Practice Providers (APPs -  Physician Assistants and Nurse Practitioners) who all work together to provide you with the care you need, when you need it.     Your next appointment:   12 month(s)  The format for your next appointment:   In Person  Provider:   Alm Clay, MD

## 2024-03-25 NOTE — Assessment & Plan Note (Signed)
 Coronary artery disease with low coronary calcium score Coronary calcium score of 11.4 indicates low risk of significant coronary artery disease. Plaque present but not significant. No need for aggressive treatment. - Recheck coronary calcium score in five years.

## 2024-03-25 NOTE — Assessment & Plan Note (Signed)
 LDL cholesterol was 140 mg/dL, high risk. Total cholesterol was 217 mg/dL. Goal: reduce LDL to <100 mg/dL. Non-statin options and supplements discussed. - Ordered fasting lipid panel to assess current cholesterol levels. - Consider Zetia or Nexletol if LDL remains high after lifestyle modifications. - Encouraged continued diet, exercise, and weight loss efforts.

## 2024-04-03 ENCOUNTER — Telehealth: Payer: Self-pay | Admitting: Cardiology

## 2024-04-03 NOTE — Telephone Encounter (Signed)
 I guess I do not understand-Will she be coming in on the 18th?  Or is she coming in later.  Alm Clay, MD

## 2024-04-03 NOTE — Telephone Encounter (Signed)
 STAT if HR is under 50 or over 120 (normal HR is 60-100 beats per minute)  What is your heart rate? 149 last night   Do you have a log of your heart rate readings (document readings)? No   Do you have any other symptoms? Tight jaw, headache.  Pt states last night she had a severe episode of tachycardia with weird symptoms. Her highest HR reading was 149. Today it has been in the 80s. Please advise.

## 2024-04-03 NOTE — Telephone Encounter (Signed)
° °  Spoke to patient . Patient states she did not have anything abnormal happening yesterday. She states she laid down  and after 5 minutes  heart started  to race.  She states the range was between 144 -149 lasting  about 10 mins.or more  She had jaw pain  and headache. She states she took Baby Aspirin  . She states at her last office visit -  Dr Anner had instructed  her take  an extra Carvedilol  if she had an episode, which she did.  Her heart rate decrease.  She states it has been in the 80's every since.  She is concerned and would like to be evaluated- appt schedule with DOD 04/04/24. She is unable on 04/04/24.    Appt given patient verbalized understanding

## 2024-04-04 ENCOUNTER — Encounter: Payer: Self-pay | Admitting: Cardiovascular Disease

## 2024-04-04 ENCOUNTER — Ambulatory Visit

## 2024-04-04 ENCOUNTER — Ambulatory Visit: Admitting: Cardiovascular Disease

## 2024-04-04 VITALS — BP 126/76 | HR 62 | Ht 66.0 in | Wt 169.2 lb

## 2024-04-04 DIAGNOSIS — R000 Tachycardia, unspecified: Secondary | ICD-10-CM

## 2024-04-04 DIAGNOSIS — I10 Essential (primary) hypertension: Secondary | ICD-10-CM

## 2024-04-04 NOTE — Patient Instructions (Signed)
 Medication Instructions:  Your physician recommends that you continue on your current medications as directed. Please refer to the Current Medication list given to you today.  *If you need a refill on your cardiac medications before your next appointment, please call your pharmacy*  Lab Work: none If you have labs (blood work) drawn today and your tests are completely normal, you will receive your results only by: MyChart Message (if you have MyChart) OR A paper copy in the mail If you have any lab test that is abnormal or we need to change your treatment, we will call you to review the results.  Testing/Procedures: Your physician has requested that you have an echocardiogram. Echocardiography is a painless test that uses sound waves to create images of your heart. It provides your doctor with information about the size and shape of your heart and how well your hearts chambers and valves are working. This procedure takes approximately one hour. There are no restrictions for this procedure. Please do NOT wear cologne, perfume, aftershave, or lotions (deodorant is allowed). Please arrive 15 minutes prior to your appointment time.  Please note: We ask at that you not bring children with you during ultrasound (echo/ vascular) testing. Due to room size and safety concerns, children are not allowed in the ultrasound rooms during exams. Our front office staff cannot provide observation of children in our lobby area while testing is being conducted. An adult accompanying a patient to their appointment will only be allowed in the ultrasound room at the discretion of the ultrasound technician under special circumstances. We apologize for any inconvenience.  Dr Verlin has requested you wear a 14 day zio monitor  Follow-Up: At [facility], you and your health needs are our priority.  As part of our continuing mission to provide you with exceptional heart care, our providers are all part of one  team.  This team includes your primary Cardiologist (physician) and Advanced Practice Providers or APPs (Physician Assistants and Nurse Practitioners) who all work together to provide you with the care you need, when you need it.  Your next appointment:   4-6  week(s)  Provider:   Alm Clay, MD    We recommend signing up for the patient portal called MyChart.  Sign up information is provided on this After Visit Summary.  MyChart is used to connect with patients for Virtual Visits (Telemedicine).  Patients are able to view lab/test results, encounter notes, upcoming appointments, etc.  Non-urgent messages can be sent to your provider as well.   To learn more about what you can do with MyChart, go to [email or web address].   ZIO XT- Long Term Monitor Instructions  Your physician has requested you wear a ZIO patch monitor for 14days.  This is a single patch monitor. [employer] supplies one patch monitor per enrollment. Additional stickers are not available. Please do not apply patch if you will be having a Nuclear Stress Test,  Echocardiogram, Cardiac CT, MRI, or Chest Xray during the period you would be wearing the  monitor. The patch cannot be worn during these tests. You cannot remove and re-apply the  ZIO XT patch monitor.  Your ZIO patch monitor will be mailed 3 day USPS to your address on file. It may take 3-5 days  to receive your monitor after you have been enrolled.  Once you have received your monitor, please review the enclosed instructions. Your monitor  has already been registered assigning a specific monitor serial # to you.  Billing  and Patient Assistance Program Information  We have supplied [employer] with any of your insurance information on file for billing purposes. [employer] offers a sliding scale Patient Assistance Program for patients that do not have  insurance, or whose insurance does not completely cover the cost of the ZIO monitor.  You must apply for the Patient  Assistance Program to qualify for this discounted rate.  To apply, please call [employer] at [phone], select option 4, select option 2, ask to apply for  Patient Assistance Program. Meredeth will ask your household income, and how many people  are in your household. They will quote your out-of-pocket cost based on that information.  [employer] will also be able to set up a 62-month, interest-free payment plan if needed.  Applying the monitor   Shave hair from upper left chest.  Hold abrader disc by orange tab. Rub abrader in 40 strokes over the upper left chest as  indicated in your monitor instructions.  Clean area with 4 enclosed alcohol pads. Let dry.  Apply patch as indicated in monitor instructions. Patch will be placed under collarbone on left  side of chest with arrow pointing upward.  Rub patch adhesive wings for 2 minutes. Remove white label marked 1. Remove the white  label marked 2. Rub patch adhesive wings for 2 additional minutes.  While looking in a mirror, press and release button in center of patch. A small green light will  flash 3-4 times. This will be your only indicator that the monitor has been turned on.  Do not shower for the first 24 hours. You may shower after the first 24 hours.  Press the button if you feel a symptom. You will hear a small click. Record Date, Time and  Symptom in the Patient Logbook.  When you are ready to remove the patch, follow instructions on the last 2 pages of Patient  Logbook. Stick patch monitor onto the last page of Patient Logbook.  Place Patient Logbook in the blue and white box. Use locking tab on box and tape box closed  securely. The blue and white box has prepaid postage on it. Please place it in the mailbox as  soon as possible. Your physician should have your test results approximately 7 days after the  monitor has been mailed back to [facility].  Call [facility] Customer Care at [phone] if you have questions  regarding  your ZIO XT patch monitor. Call them immediately if you see an orange light blinking on your  monitor.  If your monitor falls off in less than 4 days, contact our Monitor department at [phone].  If your monitor becomes loose or falls off after 4 days call [employer] at [phone] for  suggestions on securing your monitor

## 2024-04-04 NOTE — Progress Notes (Signed)
 Chief Complaint  Patient presents with   Follow-up    Palpitations    History of Present Illness: 68 yo female with history of HTN, HLD, CAD (abnormal calcium score), Factor V Leiden, GERD, IBS here today for unplanned follow up on my DOD day. She is followed in our office by Dr. Anner. She had a CT coronary calcium score of 11 in June 2025. She had a cardiac monitor in March 2024 with sinus, rare PACs, rare PVCs and 2 short runs of VT (longest 13 beats). She called our office yesterday with complaints of tachycardia. She felt her heart racing two days ago and her heart rate was over 140. Her jaw began to hurt. She laid down and took an extra Coreg  and her symptoms resolved. She overall feels well at baseline. She has no worrisome exertional symptoms.   Primary Care Physician: Booker Darice SAUNDERS, FNP   Past Medical History:  Diagnosis Date   Allergy    Arthritis    lower back   Cataract    Colon polyps    Diverticulitis    Elevated cholesterol    Endometriosis 02/02/2017   Establishing care with new doctor, encounter for 08/31/2023   Factor V Leiden 02/02/2017   GERD (gastroesophageal reflux disease)    IBS (irritable bowel syndrome)    Liver hemangioma 02/02/2017   Low back pain    Lumbar radiculopathy    NASH (nonalcoholic steatohepatitis) 02/02/2017   Scoliosis    Spinal stenosis of lumbar region     Past Surgical History:  Procedure Laterality Date   ABDOMINAL HYSTERECTOMY  1998   TOTAL, on estrogen  until 2012   BACK SURGERY  2000   disc removed L4L5   CHOLECYSTECTOMY     COLONOSCOPY     ESOPHAGOGASTRODUODENOSCOPY  2014   In [place]   EYE SURGERY     Cataracts   HEMORRHOID SURGERY  1996   LAPAROSCOPIC ENDOMETRIOSIS FULGURATION     x 6, 1980-1991   UPPER GASTROINTESTINAL ENDOSCOPY      Current Outpatient Medications  Medication Sig Dispense Refill   aspirin EC 81 MG tablet Take 81 mg by mouth as needed.     B Complex Vitamins (VITAMIN B COMPLEX PO) vitamin B  complex     carvedilol  (COREG ) 3.125 MG tablet TAKE 1 TABLET BY MOUTH 2 TIMES A DAY 60 tablet 0   Cholecalciferol (D3 PO) Take 4,000 mg by mouth daily.     magnesium oxide (MAG-OX) 400 (240 Mg) MG tablet Take 400 mg by mouth daily.     metroNIDAZOLE  (METROCREAM ) 0.75 % cream Apply 1 Application topically as needed.     NON FORMULARY Smooth Move  1 cup of tea by mouth as needed     OMEGA-3 FATTY ACIDS PO Take 1 capsule by mouth daily.     pantoprazole  (PROTONIX ) 40 MG tablet Take 1 tablet (40 mg total) by mouth daily. 90 tablet 1   polyethylene glycol (MIRALAX / GLYCOLAX) 17 g packet Take 17 g by mouth as needed.     triamcinolone  cream (KENALOG ) 0.1 % SMARTSIG:2 Topical Twice Daily     No current facility-administered medications for this visit.    Allergies[1]  Social History   Socioeconomic History   Marital status: Married    Spouse name: Tom   Number of children: 1   Years of education: Not on file   Highest education level: Associate degree: occupational, scientist, product/process development, or vocational program  Occupational History  Employer: [employer]    Comment: loss adjuster, chartered  Tobacco Use   Smoking status: Never   Smokeless tobacco: Never  Vaping Use   Vaping status: Never Used  Substance and Sexual Activity   Alcohol use: Not Currently    Comment: OCC    Drug use: Never   Sexual activity: Not Currently    Partners: Male    Comment: 1st intercourse- 20, partners- 3, married- 24 yrs   Other Topics Concern   Not on file  Social History Narrative   Lives with husband   Caffeine- coffee 1-2 a day   Not able to exercise 2/2 back pain   Social Drivers of Health   Tobacco Use: Low Risk (04/04/2024)   Patient History    Smoking Tobacco Use: Never    Smokeless Tobacco Use: Never    Passive Exposure: Not on file  Financial Resource Strain: Low Risk (02/22/2024)   Overall Financial Resource Strain (CARDIA)    Difficulty of Paying Living Expenses: Not hard at all  Food  Insecurity: No Food Insecurity (02/22/2024)   Epic    Worried About Programme Researcher, Broadcasting/film/video in the Last Year: Never true    Ran Out of Food in the Last Year: Never true  Transportation Needs: No Transportation Needs (02/22/2024)   Epic    Lack of Transportation (Medical): No    Lack of Transportation (Non-Medical): No  Physical Activity: Sufficiently Active (02/22/2024)   Exercise Vital Sign    Days of Exercise per Week: 5 days    Minutes of Exercise per Session: 40 min  Stress: No Stress Concern Present (04/27/2023)   [employer] of Occupational Health - Occupational Stress Questionnaire    Feeling of Stress : Not at all  Social Connections: Socially Integrated (02/22/2024)   Social Connection and Isolation Panel    Frequency of Communication with Friends and Family: More than three times a week    Frequency of Social Gatherings with Friends and Family: Once a week    Attends Religious Services: More than 4 times per year    Active Member of [employer] or Organizations: Yes    Attends Engineer, Structural: More than 4 times per year    Marital Status: Married  Catering Manager Violence: Not At Risk (09/12/2021)   Received from [facility]   HITS    Over the last 12 months how often did your partner physically hurt you?: Never    Over the last 12 months how often did your partner insult you or talk down to you?: Never    Over the last 12 months how often did your partner threaten you with physical harm?: Never    Over the last 12 months how often did your partner scream or curse at you?: Never  Depression (PHQ2-9): Low Risk (08/31/2023)   Depression (PHQ2-9)    PHQ-2 Score: 0  Alcohol Screen: Not on file  Housing: Unknown (02/22/2024)   Epic    Unable to Pay for Housing in the Last Year: No    Number of Times Moved in the Last Year: Not on file    Homeless in the Last Year: No  Utilities: Not on file  Health Literacy: Not on file    Family History  Problem Relation Age of  Onset   Cirrhosis Mother    Diabetes Mother    Arthritis Mother    Gout Mother    Depression Mother    Bowel Disease Mother  Heart disease Father    Diabetes Father    Heart attack Father 68       during knee surgery   Valvular heart disease Sister        had valve surgery   Depression Sister    Diabetes Brother    Factor V Leiden deficiency Brother        with blood clots   Hyperlipidemia Son    Cancer Maternal Grandmother 59       colon cancer   Colon cancer Maternal Grandmother    Atrial fibrillation Brother    Valvular heart disease Brother        valve surgery   Hypertension Brother    Esophageal cancer Neg Hx    Rectal cancer Neg Hx    Stomach cancer Neg Hx     Review of Systems:  As stated in the HPI and otherwise negative.   BP 126/76   Pulse 62   Ht 5' 6 (1.676 m)   Wt 169 lb 3.2 oz (76.7 kg)   SpO2 97%   BMI 27.31 kg/m   Physical Examination: General: Well developed, well nourished, NAD  HEENT: OP clear, mucus membranes moist  SKIN: warm, dry. No rashes. Neuro: No focal deficits  Musculoskeletal: Muscle strength 5/5 all ext  Psychiatric: Mood and affect normal  Neck: No JVD, no carotid bruits, no thyromegaly, no lymphadenopathy.  Lungs:Clear bilaterally, no wheezes, rhonci, crackles Cardiovascular: Regular rate and rhythm. No murmurs, gallops or rubs. Abdomen:Soft. Bowel sounds present. Non-tender.  Extremities: No lower extremity edema. Pulses are 2 + in the bilateral DP/PT.  EKG:  EKG is ordered today. The ekg ordered today demonstrates  EKG Interpretation Date/Time:  Thursday April 04 2024 12:00:27 EST Ventricular Rate:  79 PR Interval:  134 QRS Duration:  66 QT Interval:  390 QTC Calculation: 447 R Axis:   15  Text Interpretation: Normal sinus rhythm Normal ECG When compared with ECG of 27-Sep-2023 10:23, No significant change was found Confirmed by Verlin Bruckner [phone]) on 04/04/2024 12:07:51 PM   Recent Labs: 08/07/2023:  BNP 17.1; Hemoglobin 14.0; Platelets 242; TSH 1.540 09/27/2023: ALT 26; BUN 19; Creatinine, Ser 0.88; Potassium 4.9; Sodium 143   Lipid Panel    Component Value Date/Time   CHOL 217 (H) 09/27/2023 1205   TRIG 143 09/27/2023 1205   HDL 51 09/27/2023 1205   CHOLHDL 4.3 09/27/2023 1205   CHOLHDL 4.1 03/18/2022 1608   VLDL 21.4 05/13/2021 0731   LDLCALC 140 (H) 09/27/2023 1205   LDLCALC 129 (H) 03/18/2022 1608     Wt Readings from Last 3 Encounters:  04/04/24 169 lb 3.2 oz (76.7 kg)  03/25/24 167 lb 9.6 oz (76 kg)  02/23/24 164 lb (74.4 kg)    Assessment and Plan:   1. Palpitations/Tachycardia: Isolated episode. No recurrence.  -Will arrange a 14 day cardiac monitor to exclude atrial fib/flutter/VT.  -Will arrange an echocardiogram to assess LV function.  -Continue Coreg   2. HTN: BP controlled.  -Continue Coreg .   Labs/ tests ordered today include:   Orders Placed This Encounter  Procedures   LONG TERM MONITOR (3-14 DAYS)   EKG 12-Lead   ECHOCARDIOGRAM COMPLETE   Disposition:   F/U with Dr. Anner in 4-6 weeks  Signed, Bruckner Verlin, MD, [facility] 04/04/2024 12:39 PM    [facility] [address] [place], [place], [place]  [address] Phone: [phone]; Fax: [phone]       [1]  Allergies Allergen Reactions  Morphine And Codeine Swelling and Rash    Not allergic to codeine

## 2024-04-04 NOTE — Progress Notes (Unsigned)
 Applied a 14 day Zio XT monitor to patient in the office ?

## 2024-04-05 ENCOUNTER — Ambulatory Visit: Payer: Self-pay | Admitting: Cardiology

## 2024-04-05 NOTE — Telephone Encounter (Signed)
 Patient was seen 04/04/24 by DOD

## 2024-04-08 LAB — COMPREHENSIVE METABOLIC PANEL WITH GFR
ALT: 29 IU/L (ref 0–32)
AST: 25 IU/L (ref 0–40)
Albumin: 4.5 g/dL (ref 3.9–4.9)
Alkaline Phosphatase: 70 IU/L (ref 49–135)
BUN/Creatinine Ratio: 21 (ref 12–28)
BUN: 19 mg/dL (ref 8–27)
Bilirubin Total: 0.6 mg/dL (ref 0.0–1.2)
CO2: 23 mmol/L (ref 20–29)
Calcium: 9.5 mg/dL (ref 8.7–10.3)
Chloride: 105 mmol/L (ref 96–106)
Creatinine, Ser: 0.92 mg/dL (ref 0.57–1.00)
Globulin, Total: 1.8 g/dL (ref 1.5–4.5)
Glucose: 96 mg/dL (ref 70–99)
Potassium: 4.4 mmol/L (ref 3.5–5.2)
Sodium: 142 mmol/L (ref 134–144)
Total Protein: 6.3 g/dL (ref 6.0–8.5)
eGFR: 69 mL/min/1.73 (ref >59)

## 2024-04-08 LAB — LIPID PANEL
Chol/HDL Ratio: 3.8 ratio (ref 0.0–4.4)
Cholesterol, Total: 219 mg/dL — ABNORMAL HIGH (ref 100–199)
HDL: 58 mg/dL (ref >39)
LDL Chol Calc (NIH): 143 mg/dL — ABNORMAL HIGH (ref 0–99)
Triglycerides: 100 mg/dL (ref 0–149)
VLDL Cholesterol Cal: 18 mg/dL (ref 5–40)

## 2024-04-08 LAB — LIPOPROTEIN A (LPA): Lipoprotein (a): <8.4 nmol/L

## 2024-04-08 MED ORDER — EZETIMIBE 10 MG PO TABS: 10.0000 mg | ORAL_TABLET | Freq: Every day | ORAL | 5 refills | Status: AC

## 2024-04-08 NOTE — Telephone Encounter (Signed)
-----   Message from Alm Clay, MD sent at 04/05/2024  9:59 AM EST ----- Not much change in cholesterol levels.  Total cholesterol stayed relatively stable at 219 when it was 217 and LDL is 143 and had been 140.  Unfortunately does not look like lifestyle modifications are making much of a difference.  We really want to try to get the LDL down below 100.  Non-statin options include Zetia  and Nexletol.  Probably the easiest will be to start Zetia /ezetimibe  10 mg daily..  If tolerated after 1 month I would probably go ahead and add Nexletol 180 mg daily.  Rx #1 ezetimibe  10 mg daily; dispense #30 tabs with 11 refills; Rx #2 Nexletol 180 mg daily; dispense #30 tabs, 11 refills.  If these are tolerated, we can change to 90 days.  Alm Clay, MD

## 2024-04-08 NOTE — Telephone Encounter (Signed)
"   Left message for patient on voicemail   Patient to start Zetia  10 mg daily .   [employer] She will be able to let Dr Anner know how she has tolerated medication at next appointment 1/127/26   Any question she can call back  "

## 2024-04-09 NOTE — Telephone Encounter (Signed)
Pt returning call, please advise.

## 2024-04-09 NOTE — Telephone Encounter (Signed)
 Spoke with patient regarding Dr. Genice recommendations. (See previous notes) Pt read his MyChart message, received Sharon's voicemail and wanted to let us  know that if her insurance would pay for it, she would take the Zetia . If it caused side effects, she was stopping it. Pt also stated she doesn't like prescribed medications and would see if she could take it. Educated patient that we would not know if it helped until lab work redrawn and to start ASAP if she is able. Will send to Dr. Anner for update. Pt verbalized understanding of plan.

## 2024-04-22 ENCOUNTER — Other Ambulatory Visit: Payer: Self-pay

## 2024-04-24 MED ORDER — CARVEDILOL 3.125 MG PO TABS: 3.1250 mg | ORAL_TABLET | Freq: Two times a day (BID) | ORAL | 3 refills | Status: AC

## 2024-04-25 ENCOUNTER — Other Ambulatory Visit: Payer: Self-pay | Admitting: Family Medicine

## 2024-04-25 DIAGNOSIS — Z1231 Encounter for screening mammogram for malignant neoplasm of breast: Secondary | ICD-10-CM

## 2024-04-26 DIAGNOSIS — R000 Tachycardia, unspecified: Secondary | ICD-10-CM

## 2024-04-28 ENCOUNTER — Ambulatory Visit: Payer: Self-pay | Admitting: Cardiology

## 2024-05-02 ENCOUNTER — Ambulatory Visit: Admitting: Family Medicine

## 2024-05-13 NOTE — Assessment & Plan Note (Signed)
 Ca score was quite low at 11.4 but does indicate presence of coronary plaque.   Would recommend LDL <100.  She is very reluctant to start statins. Other risk factor BP is well-controlled with beta-blocker. No indication for aspirin.  -Discussed starting ezetimibe  with the potential of adding Nexletol

## 2024-05-13 NOTE — Assessment & Plan Note (Signed)
 Total cholesterol 219 and LDL 143. With target LDL less than 100, would likely require medical management.  In light of her expressed interest to avoid statins, my Initial choice would probably be ezetimibe  and then add Nexletol if necessary.

## 2024-05-13 NOTE — Progress Notes (Unsigned)
 " Cardiology Office Note:  .   Date:  05/14/2024  ID:  Samantha Mckinney, DOB 11/20/1955, MRN [id number] PCP: Booker Darice SAUNDERS, FNP  [facility] Providers Cardiologist:  Alm Clay, MD     Chief Complaint  Patient presents with   Follow-up   Palpitations    Episodes of tachycardia    Patient Profile: .     Samantha Mckinney is a  69 y.o. female with a PMH notable for HTN, HLD, factor V Leiden deficiency, NASH with family history of CAD (CAC <100) who presents here for 1 month follow-up to discuss paroxysmal tachycardia, and reviewed the results of echocardiogram and Zio patch monitor. She was initially referred at the request of Booker Darice SAUNDERS, FNP,.  I most recently saw Samantha Mckinney on March 25, 2024 as a 53-month follow-up.  Over the summer 2025 she noted that she was very active on the treadmill and doing bike exercises at least 3 days a week.  She recently retired from a high stress job.  We had decided to risk stratify with a Coronary Calcium Score in June 2025 and her total score was very low at 11.4).  She no longer noting any issues with chest pain or pressure with rest or exertion.  She had a history of palpitations tachycardia spells but were feeling fine at the time on carvedilol  3.125 mg twice daily.  Recheck lipids and discussed nonstatin options with targeting LDL less than 100.  She returned to the office on December 18 where she was seen by Dr. Verlin as a DOD patient having called in with complaints of tachycardia 2 days prior.  She noted her heart rate went up to the 140s and had some jaw pain associated with it.  She took an extra Coreg  and her symptoms resolved. Otherwise, no further concerning symptoms. => Zio patch monitor as well as echocardiogram ordered.  She now returns to discuss results-however her Echo is scheduled for today.  She called into the office on January 11 where she had an episode of rapid heart rate standing against the wall.  Her smart phone indicated  her heart rate went up to 159 bpm.  She said it lasted for a while and then resolved.      Subjective  Discussed the use of AI scribe software for clinical note transcription with the patient, who gave verbal consent to proceed.  History of Present Illness Samantha Mckinney is a 69 year old female who presents with episodes of tachycardia and associated symptoms.  She experiences episodes of tachycardia with heart rates reaching up to 159 beats per minute. These episodes have occurred twice recently, lasting approximately 20 and 10 minutes, respectively. During these episodes, she feels her heart 'racing' and 'pounding hard'.  She describes associated symptoms of jaw pain, which she likens to being 'punched in the jaw', with pain radiating to her teeth. She also feels lightheaded and as if she might 'pass out'.  During one episode, she took a baby aspirin and an additional dose of her tachycardia medication, which eventually helped reduce her heart rate. However, her heart rate does not always decrease quickly during these episodes. She uses an Apple Watch to monitor her heart rate, which has alerted her to elevated heart rates even when she does not feel symptomatic. She has not experienced any episodes during a 14-day heart monitor test.  Her current medication includes carvedilol , which she takes regularly, and she has taken additional doses during episodes.  She  has a family history of heart problems, including a brother who recently underwent a lengthy heart surgery for an enlarged heart and aorta issues.  Cardiovascular ROS: positive for - palpitations, rapid heart rate, and Jaw tightness associated with one of the 2 rapid heart rate spells; 2 episodes lasting somewhere between 5 minutes to 30 minutes heart rates ranging from 140s to 150 negative for - chest pain, dyspnea on exertion, edema, orthopnea, paroxysmal nocturnal dyspnea, shortness of breath, or lightheadedness, dizziness or  wooziness, syncope or near syncope, TIA or amaurosis fugax, claudication     Objective   Active Medications[1]  Studies Reviewed: .        Results Lab Results  Component Value Date   CHOL 219 (H) 04/04/2024   HDL 58 04/04/2024   LDLCALC 143 (H) 04/04/2024   TRIG 100 04/04/2024   CHOLHDL 3.8 04/04/2024   Lab Results  Component Value Date   NA 142 04/04/2024   K 4.4 04/04/2024   CREATININE 0.92 04/04/2024   EGFR 69 04/04/2024   GLUCOSE 96 04/04/2024   Lab Results  Component Value Date   WBC 5.0 08/07/2023   HGB 14.0 08/07/2023   HCT 42.9 08/07/2023   MCV 92 08/07/2023   PLT 242 08/07/2023   Lab Results  Component Value Date   HGBA1C 5.7 01/24/2023    Cardiac Studies: Echocardiogram is scheduled for today: Pending  14-day Zio patch Monitor: December 2025-January 2026:  Predominant underlying rhythm is sinus rhythm with a rate range of 47 to 135 bpm and average 74 bpm.   Rare (<1%) premature atrial and ventricular contractions (PACs).   1 Atrial Run of 7 beats averaging 136 bpm.  No Sustained Arrhythmias: Atrial Tachycardia (AT), Supraventricular Tachycardia (SVT), Atrial Fibrillation (A-Fib), Atrial Flutter (A-Flutter), Sustained Ventricular Tachycardia (VT)- .  No symptoms noted.     Risk Assessment/Calculations:              Physical Exam:   VS:  BP 128/78   Pulse 72   Ht 5' 6 (1.676 m)   Wt 170 lb (77.1 kg)   SpO2 98%   BMI 27.44 kg/m    Wt Readings from Last 3 Encounters:  05/14/24 170 lb (77.1 kg)  04/04/24 169 lb 3.2 oz (76.7 kg)  03/25/24 167 lb 9.6 oz (76 kg)    GEN: Healthy appearing.  Well nourished, well groomed; in no acute distress NECK: No JVD; No carotid bruits CARDIAC: Normal S1, S2; RRR, no murmurs, rubs, gallops RESPIRATORY:  Clear to auscultation without rales, wheezing or rhonchi ; nonlabored, good air movement. ABDOMEN: Soft, non-tender, non-distended EXTREMITIES:  No edema; No deformity     ASSESSMENT AND PLAN: .    Paroxysmal tachycardia (HCC) For the most part she has been pretty well-controlled with carvedilol  3.25 mg twice daily. It seems like over the last 2 months she has had some more prolonged episodes of tachycardia.  Her monitor did not show any prolonged tachycardia spells however on the Apple watch she has noted at least 2 episodes lasting several minutes.  I suspect this is probably SVT based on the duration. Episodes of SVT with heart rates up to 159 bpm, lasting 10-20 minutes. Carvedilol  effective but not quick-acting. Vagal maneuvers can terminate episodes. Ablation considered if episodes become frequent or symptomatic. - Continue carvedilol  as prescribed.  (3.25 mg twice daily)  - Take an extra dose of carvedilol  if episodes last up to an hour or occur more than twice in a day.  -  Consider a shorter-acting beta blocker like propranolol or metoprolol if episodes become frequent. - Perform vagal maneuvers during episodes, including coughing, bearing down, and drinking ice water. - Avoid potential triggers such as caffeine, alcohol, stress, and dehydration.  Discussed vagal maneuvers, avoiding triggers and maintaining adequate hydration Discussed recommendation to get a place where she can sit down or lie down and elevate her feet. Consider titrating up beta-blocker if BP tolerates Follow-up Echocardiogram results  Agatston CAC score, <100 Ca score was quite low at 11.4 but does indicate presence of coronary plaque.   Would recommend LDL <100.  She is very reluctant to start statins. Other risk factor BP is well-controlled with beta-blocker. No indication for aspirin.  -Discussed continuing ezetimibe  with the potential of adding Nexletol if lipids not at goal. - Follow a Mediterranean or DASH diet.  Mixed hyperlipidemia Total cholesterol 219 and LDL 143. With target LDL less than 100, would likely require medical management.  In light of her expressed interest to avoid statins, my  Initial choice was to start probably be ezetimibe /Zetia  and then add Nexletol if necessary. Zetia  prescribed, reports back pain, though Zetia  should not cause muscle pain. - Continue Zetia  10 mg as prescribed. - Ordered blood work to assess cholesterol levels in about a month. - Will reassess cholesterol management in the summer.  (Reports of 1 follow-up.   Orders Placed This Encounter  Procedures   Hepatic function panel   Lipid panel   Hepatic function panel   Lipid panel    No orders of the defined types were placed in this encounter.         Follow-Up: Return in about 6 months (around 11/11/2024) for 6 month follow-up with me, [employer].  I spent 47 minutes in the care of Samantha Mckinney today including reviewing labs (2 minutes), reviewing studies (4 minutes reviewing echo and monitor), face to face time discussing treatment options (31 minutes), reviewing records from my previous clinic note as well as Dr. Santa note (4 minutes), 6 minutes dictating, and documenting in the encounter.   Portions of this note were dictated using DRAGON voice recognition software. Please disregard any errors in transcription. This record has been created using Conservation officer, historic buildings. Errors have been sought and corrected, but may not always be located. Such creation errors do not reflect on the standard of medical care.      Signed, Alm MICAEL Clay, MD, MS Alm Clay, M.D., M.S. Interventional Cardiologist  [facility] Pager # [phone]         [1]  Current Meds  Medication Sig   aspirin EC 81 MG tablet Take 81 mg by mouth as needed.   B Complex Vitamins (VITAMIN B COMPLEX PO) vitamin B complex   carvedilol  (COREG ) 3.125 MG tablet Take 1 tablet (3.125 mg total) by mouth 2 (two) times daily.   Cholecalciferol (D3 PO) Take 4,000 mg by mouth daily.   ezetimibe  (ZETIA ) 10 MG tablet Take 1 tablet (10 mg total) by mouth daily.   magnesium  oxide (MAG-OX) 400 (240 Mg) MG tablet Take 400 mg by mouth daily.   metroNIDAZOLE  (METROCREAM ) 0.75 % cream Apply 1 Application topically as needed.   NON FORMULARY Smooth Move  1 cup of tea by mouth as needed   OMEGA-3 FATTY ACIDS PO Take 1 capsule by mouth daily.   pantoprazole  (PROTONIX ) 40 MG tablet Take 1 tablet (40 mg total) by mouth daily.   polyethylene glycol (MIRALAX / GLYCOLAX)  17 g packet Take 17 g by mouth as needed.   triamcinolone  cream (KENALOG ) 0.1 % SMARTSIG:2 Topical Twice Daily   "

## 2024-05-13 NOTE — Assessment & Plan Note (Addendum)
 For the most part she has been pretty well-controlled with carvedilol  3.25 mg twice daily. It seems like over the last 2 months she has had some more prolonged episodes of tachycardia.  Her monitor did not show any prolonged tachycardia spells however on the March she is noted at least 2 episodes lasting several minutes.  I suspect this is probably SVT based on the duration. Discussed vagal maneuvers, avoiding triggers and maintaining adequate hydration Discussed recommendation to get a place where she can sit down or lie down and elevate her feet. Consider titrating up beta-blocker if BP tolerates

## 2024-05-14 ENCOUNTER — Ambulatory Visit
Admission: RE | Admit: 2024-05-14 | Discharge: 2024-05-14 | Disposition: A | Discharge status: Home / Self Care | Source: Ambulatory Visit

## 2024-05-14 ENCOUNTER — Ambulatory Visit: Admitting: Cardiology

## 2024-05-14 ENCOUNTER — Encounter: Payer: Self-pay | Admitting: Cardiology

## 2024-05-14 VITALS — BP 128/78 | HR 72 | Ht 66.0 in | Wt 170.0 lb

## 2024-05-14 DIAGNOSIS — R931 Abnormal findings on diagnostic imaging of heart and coronary circulation: Secondary | ICD-10-CM | POA: Insufficient documentation

## 2024-05-14 DIAGNOSIS — I479 Paroxysmal tachycardia, unspecified: Secondary | ICD-10-CM

## 2024-05-14 DIAGNOSIS — E782 Mixed hyperlipidemia: Secondary | ICD-10-CM | POA: Insufficient documentation

## 2024-05-14 DIAGNOSIS — R000 Tachycardia, unspecified: Secondary | ICD-10-CM | POA: Insufficient documentation

## 2024-05-14 DIAGNOSIS — I10 Essential (primary) hypertension: Secondary | ICD-10-CM

## 2024-05-14 DIAGNOSIS — D6851 Activated protein C resistance: Secondary | ICD-10-CM

## 2024-05-14 DIAGNOSIS — I349 Nonrheumatic mitral valve disorder, unspecified: Secondary | ICD-10-CM

## 2024-05-14 LAB — ECHOCARDIOGRAM COMPLETE
Area-P 1/2: 3.98 cm2
Height: 66 in
MV M vel: 5.38 m/s
MV Peak grad: 115.8 mmHg
S' Lateral: 2.7 cm
Weight: 2720 [oz_av]

## 2024-05-14 NOTE — Patient Instructions (Addendum)
 Medication Instructions:   NO CHANGES    YOU MAY TAKE AN EXTRA  DOSE OF CARVEDILOL  FOR FAST HEART RATE. IF YO ARE ABLE TRY AN RECORD HEART RATE WITH YOUR PHONE. *If you need a refill on your cardiac medications before your next appointment, please call your pharmacy*   Lab Work:IN 1 MONTH  ( FEB 2026) FASTING Lipid LIVER FUNCTION PANEL   RECHECK LABS IN 6 MONTH  PRIOR TO VISIT WITH DR HARDING  (FASTING) If you have labs (blood work) drawn today and your tests are completely normal, you will receive your results only by: MyChart Message (if you have MyChart) OR A paper copy in the mail If you have any lab test that is abnormal or we need to change your treatment, we will call you to review the results.   Testing/Procedures: NOT NEEDED   Follow-Up: At [facility], you and your health needs are our priority.  As part of our continuing mission to provide you with exceptional heart care, we have created designated Provider Care Teams.  These Care Teams include your primary Cardiologist (physician) and Advanced Practice Providers (APPs -  Physician Assistants and Nurse Practitioners) who all work together to provide you with the care you need, when you need it.     Your next appointment:   6 month(s)  The format for your next appointment:   In Person  Provider:   Alm Clay, MD   Other Instructions   Recommendations for vagal maneuvers: Bearing down Coughing Gagging Icy, cold towel on face or drink ice cold water

## 2024-05-15 NOTE — Telephone Encounter (Signed)
 Mild MR is something that we often see on echocardiograms it is not usually something that we worry about.  He said that we monitor for signs of worsening murmur, but it would not be causing some shortness of breath.   Alm Clay, MD

## 2024-06-10 ENCOUNTER — Ambulatory Visit

## 2024-10-09 ENCOUNTER — Ambulatory Visit: Admitting: Cardiology
